# Patient Record
Sex: Male | Born: 1945 | Race: Black or African American | Hispanic: No | Smoking: Current some day smoker
Health system: Southern US, Community
[De-identification: ages and names within clinical notes are randomized; demographics above are authoritative.]

## PROBLEM LIST (undated history)

## (undated) DIAGNOSIS — H548 Legal blindness, as defined in USA: Secondary | ICD-10-CM

## (undated) DIAGNOSIS — H409 Unspecified glaucoma: Secondary | ICD-10-CM

## (undated) DIAGNOSIS — Z720 Tobacco use: Secondary | ICD-10-CM

## (undated) DIAGNOSIS — J9610 Chronic respiratory failure, unspecified whether with hypoxia or hypercapnia: Secondary | ICD-10-CM

## (undated) DIAGNOSIS — I10 Essential (primary) hypertension: Secondary | ICD-10-CM

## (undated) DIAGNOSIS — I4891 Unspecified atrial fibrillation: Secondary | ICD-10-CM

## (undated) DIAGNOSIS — E119 Type 2 diabetes mellitus without complications: Secondary | ICD-10-CM

## (undated) HISTORY — PX: ABDOMINAL SURGERY: SHX537

## (undated) HISTORY — PX: TRACHEOSTOMY: SUR1362

---

## 2003-12-26 ENCOUNTER — Other Ambulatory Visit: Payer: Self-pay

## 2004-01-04 ENCOUNTER — Other Ambulatory Visit: Payer: Self-pay

## 2009-01-30 ENCOUNTER — Emergency Department: Payer: Self-pay | Admitting: Emergency Medicine

## 2010-02-18 ENCOUNTER — Inpatient Hospital Stay: Payer: Self-pay | Admitting: Internal Medicine

## 2011-06-11 DIAGNOSIS — H409 Unspecified glaucoma: Secondary | ICD-10-CM | POA: Insufficient documentation

## 2012-11-20 ENCOUNTER — Emergency Department: Payer: Self-pay | Admitting: Emergency Medicine

## 2012-11-20 LAB — URINALYSIS, COMPLETE
Blood: NEGATIVE
Hyaline Cast: 25
Ph: 6 (ref 4.5–8.0)
Protein: NEGATIVE
RBC,UR: 1 /HPF (ref 0–5)
Squamous Epithelial: 2
WBC UR: 28 /HPF (ref 0–5)

## 2012-11-20 LAB — CBC WITH DIFFERENTIAL/PLATELET
Basophil #: 0 10*3/uL (ref 0.0–0.1)
Basophil %: 0.4 %
Lymphocyte #: 3.6 10*3/uL (ref 1.0–3.6)
Lymphocyte %: 53.3 %
MCH: 23 pg — ABNORMAL LOW (ref 26.0–34.0)
Monocyte #: 0.5 x10 3/mm (ref 0.2–1.0)
Neutrophil #: 2.6 10*3/uL (ref 1.4–6.5)
Neutrophil %: 38.9 %
Platelet: 441 10*3/uL — ABNORMAL HIGH (ref 150–440)
RBC: 3.75 10*6/uL — ABNORMAL LOW (ref 4.40–5.90)
RDW: 24.6 % — ABNORMAL HIGH (ref 11.5–14.5)

## 2012-11-20 LAB — DRUG SCREEN, URINE
Amphetamines, Ur Screen: NEGATIVE (ref ?–1000)
Benzodiazepine, Ur Scrn: NEGATIVE (ref ?–200)
Cannabinoid 50 Ng, Ur ~~LOC~~: NEGATIVE (ref ?–50)
Cocaine Metabolite,Ur ~~LOC~~: NEGATIVE (ref ?–300)
MDMA (Ecstasy)Ur Screen: NEGATIVE (ref ?–500)
Opiate, Ur Screen: NEGATIVE (ref ?–300)
Tricyclic, Ur Screen: NEGATIVE (ref ?–1000)

## 2012-11-20 LAB — COMPREHENSIVE METABOLIC PANEL
Alkaline Phosphatase: 102 U/L (ref 50–136)
BUN: 8 mg/dL (ref 7–18)
Calcium, Total: 8.6 mg/dL (ref 8.5–10.1)
Chloride: 97 mmol/L — ABNORMAL LOW (ref 98–107)
Creatinine: 0.69 mg/dL (ref 0.60–1.30)
EGFR (African American): >60
EGFR (Non-African Amer.): >60
Glucose: 73 mg/dL (ref 65–99)
Osmolality: 274 (ref 275–301)
SGOT(AST): 112 U/L — ABNORMAL HIGH (ref 15–37)
SGPT (ALT): 30 U/L (ref 12–78)
Total Protein: 8.2 g/dL (ref 6.4–8.2)

## 2012-11-20 LAB — ETHANOL: Ethanol: 269 mg/dL

## 2012-11-20 LAB — LIPASE, BLOOD: Lipase: 95 U/L (ref 73–393)

## 2012-11-20 LAB — AMMONIA: Ammonia, Plasma: 32 mcmol/L (ref 11–32)

## 2012-11-20 LAB — MAGNESIUM: Magnesium: 1.3 mg/dL — ABNORMAL LOW

## 2012-11-20 LAB — TROPONIN I: Troponin-I: <0.02 ng/mL

## 2013-01-21 ENCOUNTER — Inpatient Hospital Stay: Payer: Self-pay | Admitting: Internal Medicine

## 2013-01-21 LAB — DRUG SCREEN, URINE
Amphetamines, Ur Screen: NEGATIVE (ref ?–1000)
Barbiturates, Ur Screen: NEGATIVE (ref ?–200)
Methadone, Ur Screen: NEGATIVE (ref ?–300)
Opiate, Ur Screen: NEGATIVE (ref ?–300)
Phencyclidine (PCP) Ur S: NEGATIVE (ref ?–25)
Tricyclic, Ur Screen: NEGATIVE (ref ?–1000)

## 2013-01-21 LAB — CK TOTAL AND CKMB (NOT AT ARMC)
CK, Total: 47 U/L (ref 35–232)
CK-MB: <0.5 ng/mL — ABNORMAL LOW (ref 0.5–3.6)

## 2013-01-21 LAB — URINALYSIS, COMPLETE
Bilirubin,UR: NEGATIVE
Hyaline Cast: 48
Nitrite: NEGATIVE
Specific Gravity: 1.023 (ref 1.003–1.030)
WBC UR: 49 /HPF (ref 0–5)

## 2013-01-21 LAB — COMPREHENSIVE METABOLIC PANEL
Albumin: 3.2 g/dL — ABNORMAL LOW (ref 3.4–5.0)
Alkaline Phosphatase: 92 U/L (ref 50–136)
Bilirubin,Total: 0.6 mg/dL (ref 0.2–1.0)
Chloride: 95 mmol/L — ABNORMAL LOW (ref 98–107)
Co2: 32 mmol/L (ref 21–32)
Creatinine: 0.85 mg/dL (ref 0.60–1.30)
EGFR (African American): >60
EGFR (Non-African Amer.): >60
Osmolality: 269 (ref 275–301)
SGOT(AST): 48 U/L — ABNORMAL HIGH (ref 15–37)
Sodium: 134 mmol/L — ABNORMAL LOW (ref 136–145)
Total Protein: 8.6 g/dL — ABNORMAL HIGH (ref 6.4–8.2)

## 2013-01-21 LAB — IRON AND TIBC
Iron Saturation: 29 %
Iron: 74 ug/dL (ref 65–175)

## 2013-01-21 LAB — CBC
HGB: 8.9 g/dL — ABNORMAL LOW (ref 13.0–18.0)
MCH: 24.2 pg — ABNORMAL LOW (ref 26.0–34.0)
MCHC: 31.8 g/dL — ABNORMAL LOW (ref 32.0–36.0)
MCV: 76 fL — ABNORMAL LOW (ref 80–100)
RBC: 3.68 10*6/uL — ABNORMAL LOW (ref 4.40–5.90)
RDW: 22 % — ABNORMAL HIGH (ref 11.5–14.5)
WBC: 7.2 10*3/uL (ref 3.8–10.6)

## 2013-01-21 LAB — FERRITIN: Ferritin (ARMC): 429 ng/mL — ABNORMAL HIGH (ref 8–388)

## 2013-01-21 LAB — MAGNESIUM: Magnesium: 0.6 mg/dL — ABNORMAL LOW

## 2013-01-21 LAB — ETHANOL
Ethanol %: <0.003 % (ref 0.000–0.080)
Ethanol: <3 mg/dL

## 2013-01-21 LAB — TROPONIN I: Troponin-I: <0.02 ng/mL

## 2013-01-22 LAB — CBC WITH DIFFERENTIAL/PLATELET
Basophil #: 0 10*3/uL (ref 0.0–0.1)
Basophil %: 0.2 %
Eosinophil %: 0.6 %
HCT: 24.5 % — ABNORMAL LOW (ref 40.0–52.0)
Lymphocyte %: 38 %
MCH: 24.8 pg — ABNORMAL LOW (ref 26.0–34.0)
MCV: 76 fL — ABNORMAL LOW (ref 80–100)
Neutrophil #: 2.8 10*3/uL (ref 1.4–6.5)
RBC: 3.24 10*6/uL — ABNORMAL LOW (ref 4.40–5.90)

## 2013-01-22 LAB — BASIC METABOLIC PANEL
Anion Gap: 8 (ref 7–16)
BUN: 12 mg/dL (ref 7–18)
EGFR (African American): >60
Osmolality: 273 (ref 275–301)

## 2013-01-22 LAB — URINE CULTURE

## 2013-01-23 LAB — OCCULT BLOOD X 1 CARD TO LAB, STOOL: Occult Blood, Feces: NEGATIVE

## 2013-01-24 LAB — CBC WITH DIFFERENTIAL/PLATELET
Basophil #: 0 10*3/uL (ref 0.0–0.1)
Eosinophil %: 0.5 %
HCT: 23.7 % — ABNORMAL LOW (ref 40.0–52.0)
HGB: 7.7 g/dL — ABNORMAL LOW (ref 13.0–18.0)
Lymphocyte #: 2.1 10*3/uL (ref 1.0–3.6)
Lymphocyte %: 31.5 %
MCH: 24.8 pg — ABNORMAL LOW (ref 26.0–34.0)
MCV: 77 fL — ABNORMAL LOW (ref 80–100)
Monocyte %: 16.1 %
Neutrophil #: 3.4 10*3/uL (ref 1.4–6.5)
Neutrophil %: 51.5 %
RDW: 21.7 % — ABNORMAL HIGH (ref 11.5–14.5)
WBC: 6.6 10*3/uL (ref 3.8–10.6)

## 2013-01-24 LAB — BASIC METABOLIC PANEL
Anion Gap: 6 — ABNORMAL LOW (ref 7–16)
BUN: 5 mg/dL — ABNORMAL LOW (ref 7–18)
Calcium, Total: 8.9 mg/dL (ref 8.5–10.1)
Creatinine: 0.78 mg/dL (ref 0.60–1.30)
EGFR (African American): >60
Glucose: 116 mg/dL — ABNORMAL HIGH (ref 65–99)
Osmolality: 276 (ref 275–301)
Potassium: 5.1 mmol/L (ref 3.5–5.1)

## 2013-07-17 ENCOUNTER — Inpatient Hospital Stay: Payer: Self-pay | Admitting: Internal Medicine

## 2013-07-17 LAB — URINALYSIS, COMPLETE
Bacteria: NONE SEEN
Bilirubin,UR: NEGATIVE
Glucose,UR: 50 mg/dL (ref 0–75)
Leukocyte Esterase: NEGATIVE
Ph: 7 (ref 4.5–8.0)
Protein: 30
RBC,UR: 1 /HPF (ref 0–5)
Specific Gravity: 1.01 (ref 1.003–1.030)
Squamous Epithelial: NONE SEEN
WBC UR: 1 /HPF (ref 0–5)

## 2013-07-17 LAB — CBC WITH DIFFERENTIAL/PLATELET
Basophil #: 0.1 x10 3/mm 3
Basophil %: 0.6 %
Eosinophil #: 0.2 x10 3/mm 3
Eosinophil %: 1.9 %
HCT: 36.4 % — ABNORMAL LOW
HGB: 11.5 g/dL — ABNORMAL LOW
Lymphocyte %: 51.4 %
Lymphs Abs: 4.9 x10 3/mm 3 — ABNORMAL HIGH
MCH: 25.3 pg — ABNORMAL LOW
MCHC: 31.6 g/dL — ABNORMAL LOW
MCV: 80 fL
Monocyte #: 1 "x10 3/mm "
Monocyte %: 10.1 %
Neutrophil #: 3.4 x10 3/mm 3
Neutrophil %: 36 %
Platelet: 392 x10 3/mm 3
RBC: 4.54 x10 6/mm 3
RDW: 15.6 % — ABNORMAL HIGH
WBC: 9.5 x10 3/mm 3

## 2013-07-17 LAB — COMPREHENSIVE METABOLIC PANEL
BUN: 10 mg/dL (ref 7–18)
Calcium, Total: 8.6 mg/dL (ref 8.5–10.1)
Chloride: 107 mmol/L (ref 98–107)
EGFR (African American): >60
EGFR (Non-African Amer.): >60
Sodium: 141 mmol/L (ref 136–145)
Total Protein: 8 g/dL (ref 6.4–8.2)

## 2013-07-17 LAB — CK TOTAL AND CKMB (NOT AT ARMC): CK-MB: 0.9 ng/mL (ref 0.5–3.6)

## 2013-07-17 LAB — TROPONIN I: Troponin-I: <0.02 ng/mL

## 2013-07-18 LAB — BASIC METABOLIC PANEL
Anion Gap: 7 (ref 7–16)
BUN: 8 mg/dL (ref 7–18)
Chloride: 101 mmol/L (ref 98–107)
Co2: 30 mmol/L (ref 21–32)
Creatinine: 0.89 mg/dL (ref 0.60–1.30)
EGFR (African American): >60
EGFR (Non-African Amer.): >60
Osmolality: 274 (ref 275–301)
Potassium: 3.4 mmol/L — ABNORMAL LOW (ref 3.5–5.1)
Sodium: 138 mmol/L (ref 136–145)

## 2013-07-18 LAB — CBC WITH DIFFERENTIAL/PLATELET
Basophil %: 0.4 %
Eosinophil %: 1.4 %
HGB: 10.8 g/dL — ABNORMAL LOW (ref 13.0–18.0)
MCH: 25.4 pg — ABNORMAL LOW (ref 26.0–34.0)
MCV: 80 fL (ref 80–100)
Monocyte #: 1.1 x10 3/mm — ABNORMAL HIGH (ref 0.2–1.0)
Neutrophil #: 3.5 10*3/uL (ref 1.4–6.5)
Platelet: 299 10*3/uL (ref 150–440)
RBC: 4.26 10*6/uL — ABNORMAL LOW (ref 4.40–5.90)
WBC: 7 10*3/uL (ref 3.8–10.6)

## 2013-07-22 LAB — CULTURE, BLOOD (SINGLE)
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
## 2014-12-22 NOTE — Discharge Summary (Signed)
PATIENT NAME:  Dominic [name], Dominic [name] MR#:  [id number] DATE OF BIRTH:  [birth year]  DATE OF ADMISSION:  01/21/2013 DATE OF DISCHARGE:  01/24/2013  DISCHARGE DIAGNOSES: 1.  Weakness due to electrolyte imbalance. 2.  Urinary tract infection. 3.  Sleep apnea status post tracheostomy.  4.  Chronic anemia, may be malnutrition.  5.  Hypotension.  6.  Diabetes.   CONDITION ON DISCHARGE: Stable.   CODE STATUS ON DISCHARGE: FULL CODE.   MEDICATIONS ON DISCHARGE:  1.  Omeprazole 20 mg delayed-release capsule once a day. 2.  Oxycodone 5 mg oral tablet every 12 hours as needed for pain. 3.  Albuterol inhaler every 4 hours as needed for shortness of breath. 4.  Aspirin 81 mg daily  5.  Enalapril 10 mg once a day.  6.  Acetaminophen and oxycodone oral 4 times a day.  7.  Zocor 20 mg once a day.  8.  Magnesium oxide 400 mg tablet once a day.  9.  Levofloxacin 500 mg oral tablet once a day for 3 days.  10.  Ferrous sulfate 325 mg oral tablet 3 times a day.   DIET ON DISCHARGE: Low sodium, carbonate controlled ADA diet. Diet consistency: Regular.   ACTIVITY: As tolerated.   DISCHARGE INSTRUCTIONS:  Advised to follow up within 1 to 2 weeks with Dr. Marva PandaSkulskie and in 2 to 4 weeks with primary care physician, and to follow with Dr. Marva PandaSkulskie for arranging upper and lower GI scopy and with primary care physician in 1 month to have hemoglobin checked and potassium and magnesium level checked.  HISTORY OF PRESENT ILLNESS: The patient is a 69 year old African American male with past medical history significant for hypertension, diabetes, obstructive sleep apnea with chronic respiratory failure, has chronic tracheostomy more than 30 years, very poor historian and he had a caregiver who was currently not present. According to the patient, he was walking with walker in the past, but had bilateral [facility] extremity pain [facility]  Was on pain medication, Percocet, for long time. Over the past 3 months he had  weakness worsening and he was not able to walk properly.  He also complained of dark-colored stool and was found having guaiac-positive by Emergency Department, but his hemoglobin level was stable, so he was admitted for generalized weakness, extremely low sodium and potassium level.  HOSPITAL COURSE AND STAY:  1.  Generalized weakness due to electrolyte imbalance, hypokalemia and hypomagnesemia.  Replaced aggressively and level came to normal. The patient started feeling significantly better with electrolyte replacement. We had physical therapy evaluation done and they suggested home with home health discharge and we arranged for that.  2.  Bilateral lower extremity weakness, subacute in nature for almost 3 months. We wanted to do MRI of the brain to see for any intracranial process playing a role, but due to his cataract surgery and questionable lens in eye, we could not do the MRI in the hospital and the patient was feeling significantly better after electrolyte replacement so we let the patient go home without doing MRI.  3.  Anemia of chronic disease.  Most likely it was iron deficiency anemia. Stool guaiac, as per the ER physician, was positive, which was repeated again in the hospital stay and it was negative. family history of colon cancer, so gastroenterology consult with Dr. Marva PandaSkulskie was done. He suggested upper and lower GI scopy as inpatient, but the patient did not want to wait for 2 more days due to holiday and he  advised to follow in the clinic with Dr. Marva Panda.  4.  UTI.  The patient was found to a urinary tract infection on admission, treated with Levaquin and discharged with oral Levaquin for 3 more days.  5.  Hypertension.  Remained under control with enalapril. 6.  Diabetes. Maintained on sliding scale insulin coverage.  7.  Smoking and alcohol use.  Counseling was done for 3 to 4 minutes and advised the nicotine patch.  IMPORTANT LABORATORY AND DIAGNOSTIC RESULTS:  In the hospital  stay: CK level 47, CK-MB less than 0.5, troponin less than 0.02. Hemoglobin 8.9, mean corpuscular volume 76, platelet count 458. Creatinine 0.85, sodium 134, potassium 2.8.  Ethanol level less than 3. Hemoglobin A1c less than 3.5. Iron level in the serum 74, iron binding capacity 257,  and iron saturation 29%. Magnesium in the serum 0.6.   Chest x-ray:  No evidence of CHF, mild hyperinflation of lung which may be voluntary or component of underlying air trapping.   Urinalysis is positive with 49 WBCs and 1+ leukocyte esterase.   Urine culture: No growth. Urine for toxicology is negative.   Hemoglobin remained stable at 8. Potassium level came up to 3.1 and magnesium to 2.  Occult blood in the stool is negative, and hemoglobin came to 7.7 on the day of discharge, and on the day of discharge potassium was 5.1.   TOTAL TIME SPENT:  On this discharge, 45 minutes.  ____________________________ Dominic [name] Dominic Pigeon, MD vgv:sb D: 01/27/2013 22:54:27 ET T: 01/28/2013 07:40:51 ET JOB#: [id number]  cc: Dominic [name]. Dominic Pigeon, MD, <Dictator> Christena Deem, MD Ileene Hutchinson. Fransico Him, MD  Altamese Dilling MD ELECTRONICALLY SIGNED 01/31/2013 12:45

## 2014-12-22 NOTE — Consult Note (Signed)
Brief Consult Note: Diagnosis: anemia, heme positive stool.   Patient was seen by consultant.   Consult note dictated.   Recommend to proceed with surgery or procedure.   Recommend further assessment or treatment.   Comments: Patietn seen and examined, please see full GI consult [phone]#[id number].  Paitne admitted with weaknes, found with microcytic andemia nd heme positive stool.  Positive h/o n/v early satiety and epigstric bloating as well as fhx colon cancer in a secondary relative.  No personal h/o luminal evaluation.  Recommend egd and colonoscopy, will arrange for tuesday.  I have discussed the risks benefits and complicaitons of egd and colonoscopy to include not limited to bleeding infection perforation and sedation and he wishes to proceed.  Electronic Signatures: Barnetta ChapelSkulskie, Martin (MD)  (Signed 25-May-14 16:55)  Authored: Brief Consult Note   Last Updated: 25-May-14 16:55 by Barnetta ChapelSkulskie, Martin (MD)

## 2014-12-22 NOTE — Discharge Summary (Signed)
PATIENT NAME:  Dominic [name], Dominic [name] MR#:  [id number] DATE OF BIRTH:  [birth year]  DATE OF ADMISSION:  07/17/2013 DATE OF DISCHARGE:  07/19/2013  DISCHARGE DIAGNOSES: 1.  Acute respiratory failure likely due to pneumonia, now on room air. 2.  Multifocal pneumonia, improving on antibiotics. 3.  Malignant hypertension, improving.  4.  Chronic obstructive sleep apnea status post tracheostomy, at baseline. 5.  Hyperlipidemia, on Zocor. 6.  History of diabetes.   SECONDARY DIAGNOSES: 1.  Hypertension.  2.  Obstructive sleep apnea and chronic respiratory failure, on CPAP.  3.  Chronic tracheostomy for more than 30 years. 4.  Left eye legal blindness.  5.  Diabetes.  6.  Hypertension.  CONSULTATIONS: None.   PROCEDURES AND RADIOLOGY: Chest x-ray on 16th of November showed multifocal pneumonia. Mild cardiomegaly.   MAJOR LABORATORY PANEL: UA on admission was negative. Blood cultures x 2 were negative.   HISTORY AND SHORT HOSPITAL COURSE: The patient is a 69 year old male with above-mentioned medical problems who was admitted for acute respiratory failure thought to be secondary to multifocal pneumonia with hypoxia. Please see Dr. Rob HickmanGouru's dictated history and physical for further details. The patient was also found to have malignant hypertension which was improving with adjustment in the blood pressure medication. He had significant improvement. Now he is close to his baseline and his oxygen requirements are none and he is on room air. He is being discharged home in stable condition.  PERTINENT PHYSICAL EXAMINATION: VITAL SIGNS: On the date of discharge, temperature 98.3, heart rate 64 per minute, respirations 20 per minute, blood pressure 162/61 mmHg, and he is saturating 98% on room air. CARDIOVASCULAR: S1, S2 normal. No murmurs, rubs, or gallops.  LUNGS: Clear to auscultation bilaterally. No wheezing, rales, rhonchi, or crepitation. ABDOMEN: Soft, benign.  NEUROLOGIC: Nonfocal examination. All  other physical examination remained at baseline.   DISCHARGE MEDICATIONS: 1.  Omeprazole 20 mg p.o. daily.  2.  Oxycodone 5 mg p.o. b.i.d. as needed. 3.  Albuterol MDI inhaler every 4 hours as needed.  4.  Habitrol 14 mg patch as needed. 5.  Aspirin 81 p.o. daily.  6.  Enalapril 10 mg p.o. daily.  7.  Zocor 20 mg p.o. at bedtime. 8.  Magnesium oxide 400 mg p.o. daily. 9.  Iron sulfate 325 mg 3 times a day. 10.  Levofloxacin 750 mg p.o. for 5 days.  11.  HCTZ 25 mg p.o. daily.  DISCHARGE DIET: Low sodium.  DISCHARGE ACTIVITY: As tolerated.   DISCHARGE INSTRUCTIONS AND FOLLOWUP: The patient was instructed to follow up with his primary care physician at [facility] in 1 to 2 weeks.  TOTAL TIME DISCHARGING THIS PATIENT: 55 minutes. ____________________________ Ellamae SiaVipul S. Sherryll BurgerShah, MD vss:sb D: 07/19/2013 13:59:24 ET T: 07/19/2013 14:45:32 ET JOB#: [id number]  cc: Avaiah Stempel S. Sherryll BurgerShah, MD, <Dictator> [facility] R. Fransico HimBiola, MD Patricia PesaVIPUL S Katera Rybka MD ELECTRONICALLY SIGNED 07/21/2013 16:23
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
## 2014-12-22 NOTE — Consult Note (Signed)
Chief Complaint:  Subjective/Chief Complaint seen for anemia, heme poaitive stool. hemodynamically stable, no nv or abdominal pain.   VITAL SIGNS/ANCILLARY NOTES: **Vital Signs.:   26-May-14 03:50  Vital Signs Type Routine  Temperature Temperature (F) 98.2  Celsius 36.7  Temperature Source oral  Pulse Pulse 72  Respirations Respirations 18  Systolic BP Systolic BP 282  Diastolic BP (mmHg) Diastolic BP (mmHg) 75  Mean BP 104  Pulse Ox % Pulse Ox % 97  Pulse Ox Activity Level  At rest  Oxygen Delivery Trach Aerosol Mask   Brief Assessment:  Cardiac Regular   Respiratory clear BS   Gastrointestinal details normal Soft  Nontender  Nondistended  No masses palpable  Bowel sounds normal   Lab Results: Routine Chem:  23-May-14 12:32   Iron Binding Capacity (TIBC) 257  Unbound Iron Binding Capacity 183  Iron, Serum 74  Iron Saturation 29 (Result(s) reported on 21 Jan 2013 at 05:17PM.)  Ferritin [facility])  429 (Result(s) reported on 21 Jan 2013 at 05:31PM.)  26-May-14 04:36   BUN  5  Creatinine (comp) 0.78  Sodium, Serum 139  Potassium, Serum 5.1  Chloride, Serum 106  CO2, Serum 27  Calcium (Total), Serum 8.9  Anion Gap  6  Osmolality (calc) 276  eGFR (African American) >60  eGFR (Non-African American) >60 (eGFR values <27m/min/1.73 m2 may be an indication of chronic kidney disease (CKD). Calculated eGFR is useful in patients with stable renal function. The eGFR calculation will not be reliable in acutely ill patients when serum creatinine is changing rapidly. It is not useful in  patients on dialysis. The eGFR calculation may not be applicable to patients at the low and high extremes of body sizes, pregnant women, and vegetarians.)  Routine Hem:  23-May-14 12:32   Hemoglobin (CBC)  8.9  24-May-14 04:52   Hemoglobin (CBC)  8.0  26-May-14 04:36   WBC (CBC) 6.6  RBC (CBC)  3.08  Hemoglobin (CBC)  7.7  Hematocrit (CBC)  23.7  Platelet Count (CBC) 379  MCV  77  MCH   24.8  MCHC 32.4  RDW  21.7  Neutrophil % 51.5  Lymphocyte % 31.5  Monocyte % 16.1  Eosinophil % 0.5  Basophil % 0.4  Neutrophil # 3.4  Lymphocyte # 2.1  Monocyte #  1.1  Eosinophil # 0.0  Basophil # 0.0 (Result(s) reported on 24 Jan 2013 at 05:08AM.)   Assessment/Plan:  Assessment/Plan:  Assessment 1) anemia , heme positive stool in the setting of multiple systemic diseases, no previous luminal evaluation.   Plan 1) egd and colonoscopy.  I haved discussed  the risks benefits and complications of these to include not limited to bleeding infection perforation and sedation and he wishes to proceed.   Electronic Signatures: SLoistine Simas(MD)  (Signed 26-May-14 11:10)  Authored: Chief Complaint, VITAL SIGNS/ANCILLARY NOTES, Brief Assessment, Lab Results, Assessment/Plan   Last Updated: 26-May-14 11:10 by SLoistine Simas(MD)
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
## 2016-03-26 ENCOUNTER — Ambulatory Visit: Payer: Medicare Other | Attending: Nurse Practitioner | Admitting: Physical Therapy

## 2016-03-26 DIAGNOSIS — M6281 Muscle weakness (generalized): Secondary | ICD-10-CM | POA: Diagnosis present

## 2016-03-26 DIAGNOSIS — R262 Difficulty in walking, not elsewhere classified: Secondary | ICD-10-CM | POA: Diagnosis present

## 2016-03-26 NOTE — Therapy (Signed)
[place] MAIN [facility] SERVICES [address] [place], [place], [address] Phone: [phone]   Fax:  [phone]  Physical Therapy Evaluation  Patient Details  Name: Dominic [name] MRN: [id number] Date of Birth: Apr 18, 1946 No Data Recorded  Encounter Date: 03/26/2016      PT End of Session - 03/26/16 1634    Visit Number 1   Number of Visits 1   Date for PT Re-Evaluation 03/26/16   PT Start Time 1525   PT Stop Time 1625   PT Time Calculation (min) 60 min   Equipment Utilized During Treatment Gait belt   Activity Tolerance Patient tolerated treatment well;Patient limited by fatigue   Behavior During Therapy [facility] for tasks assessed/performed      No past medical history on file.  No past surgical history on file.  There were no vitals filed for this visit.    PATIENT INFORMATION: Name: Dominic [name] DOB:     [birth year]                 Sex: M Date seen:  03/26/16  Time: 3:25  Address: [place], [place], [place] This evaluation/justification form will serve as the LMN for the following suppliers: __________________________ Supplier: [employer] Contact Person: Almeta Monas, [place]ess Botts Phone: [phone]   Seating Therapist: Tilman Neat, SPT Blanche East, PT, DPT Phone:   [phone]   Phone: [phone]    Spouse/Parent/Caregiver name:   Phone number:  Insurance/Payer:      Reason for Referral: Power wheelchair   Patient/Caregiver Goals: Use to move in home  Patient was seen for face-to-face evaluation for new power wheelchair.  Also present was    Almeta Monas, ATP               to discuss recommendations and wheelchair options.  Further paperwork was completed and sent to vendor.  Patient appears to qualify for power mobility device at this time per objective findings.   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 Onset:  Diagnosis: Peripheral Neuropathy, Diabetes, Arthritis, Asthma, gout, chronic pain, blindness, glaucoma, hypertension   _0 Progressive Disease Relevant past and future surgeries: Tracheostomy 1983   Height: 5'11 1/2" Weight: 218 Explain recent changes or trends in weight: None   History including Falls: Fell 3 months ago with w/c, not being locked    HOME ENVIRONMENT: _1 House  _2 Condo/town home  _3 Apartment  _4 Assisted Living    _5 Lives Alone _6  Lives with Others                                                    Hours with caregiver:   _7 Home is accessible to patient           Stairs      _8 Yes _9  No     Ramp _10 Yes _11 No Comments:  4-5 steps to get in and out, Railings both sides, reach 1 railing, about to get ramp    COMMUNITY ADL: TRANSPORTATION: _12 Car    _13 Van    <WPYKDXIPJASNKNLZ>_7<\/QBHALPFXTKWIOXBD>_53 Public Transportation    _15 Adapted w/c  Lift    _0 Ambulance    _1 Other:       _2 Sits in wheelchair during transport  Employment/School:    Needs to get out at times  Specific requirements pertaining to mobility                                                     Other:                                     FUNCTIONAL/SENSORY PROCESSING SKILLS:  Handedness:   _3 Right     _4 Left    _5 NA  Comments:                                 Functional Processing Skills for Wheeled Mobility _6 Processing Skills are adequate for safe wheelchair operation (difficulty seeing out of L eye)  Areas of concern than may interfere with safe operation of wheelchair Description of problem   _7  Attention to environment      _8 Judgment      _9  Hearing  _10  Vision or visual processing      _11 Motor Planning  _12  Fluctuations in Behavior                                                Blindness/glaucoma in L eye, R WFL Grossly able to see to direct w/c    VERBAL COMMUNICATION: _13 WFL receptive _14  WFL expressive _15 Understandable  _16 Difficult to understand (in loud environments)  _17 non-communicative _18  Uses an augmented  communication device  CURRENT SEATING / MOBILITY: Current Mobility Base:  _19 None _20 Dependent _21 Manual _22 Scooter _23 Power  Type of Control:                       Manufacturer:        [employer]                 Size:                         Age:     ~5 years                      Current Condition of Mobility Base:         Not working, tires keep blowing out, only maintains a week or 2. Battery runs out quickly.                                                                                                             Current Wheelchair components:  Describe posture in present seating system:  Able to sit comfortably                                                                           SENSATION and SKIN ISSUES: Sensation _0 Intact  _1 Impaired _2 Absent  Level of sensation: Peripheral Neuropathy, Intact LT sensation throughout LE grossly with impaired sensation in L anterior ankle and dorsum of foot                      Pressure Relief: Able to perform effective pressure relief :    _3 Yes  _4  No Method:   Shifts weight from side to side                                                                       If not, Why?:                                                                          Skin Issues/Skin Integrity Current Skin Issues  _5 Yes _6 No _7 Intact _8  Red area_9  Open Area  _10 Scar Tissue _11 At risk from prolonged sitting Where                              History of Skin Issues  _12 Yes _13 No Where                                         When                                               Hx of skin flap surgeries  _14 Yes _15 No Where                                              When                                                  Limited sitting tolerance _16 Yes _17 No Hours spent sitting in wheelchair daily:  Able to sit in chair 1-2 hours  Complaint of Pain: B feet Please describe:   Throbbing pain, props feet often, moving helps. VAS: 6-7/10                                                                                                          Swelling/Edema:    None present, reports swelling every so often with increased movement                                                                                                                                          ADL STATUS (in reference to wheelchair use):  Indep Assist Unable Indep with Equip Not assessed Comments  Dressing       x                                             Occasionally needs assistance depending on the day                     Eating       x                                                                                                                       Toileting                 x  Bathing                x                                                  Requires assistance occasionally. Uses shower chair                                                                    Grooming/ Hygiene       x                                                                                                                       Meal Prep                             x                                                                                             IADLS                  x                                               With moving outside of the house                                                 Bowel Management: _0 Continent  _1 Incontinent  _2 Accidents Comments:                                                  Bladder Management: _3 Continent  _4 Incontinent  _5 Accidents Comments:  WHEELCHAIR SKILLS: Manual w/c Propulsion: _0 UE or LE strength and endurance sufficient to  participate in ADLs using manual wheelchair Arm : _1 left _2 right   _3 Both      Distance:                                      Foot:  _4 left _5 right   _6 Both  Operate Scooter: _7  Strength, hand grip, balance and transfer appropriate for use _8 Living environment is accessible for use of scooter  Operate Power w/c:  _9  Std. Joystick   _10  Alternative Controls Indep _11  Assist _12  Dependent/ unable _13  N/A _14   _15 Safe          _16  Functional      Distance:               Bed confined without wheelchair _17  Yes _18  No   STRENGTH/RANGE OF MOTION:  Range of Motion Strength  Shoulder                         WFL                                                       4/5                             Elbow                                   WFL                                                 3+/5                            Wrist/Hand                                   WFL                                                             Impaired L grip, R grip WFL                                         Hip                                     [facility]  4-/5                     Knee                                     WFL                                                                   3+/5                      Ankle WFL                                             4/5                        MOBILITY/BALANCE:  _0  Patient is totally dependent for mobility                                                                                               Balance Transfers Ambulation  Sitting Balance: Standing Balance: _1  Independent _2  Independent/Modified Independent  _3  WFL     _4  WFL _5  Supervision _6  Supervision  _7  Uses UE for balance  _8  Supervision _9  Min Assist _10  Ambulates with Assist                           _11  Min Assist _12  Min assist _13  Mod Assist _14  Ambulates with Device:      _15  RW  _16  StW  _17  Cane  _18                 _19  Mod Assist _20  Mod  assist _21  Max assist   _22  Max Assist _23  Max assist _24  Dependent _25  Indep. Short Distance Only  _26  Unable _27  Unable _28  Lift / Sling Required Distance (in feet)      0.31 m/s, limited home ambulator: able to complete 205' with 6MWT, required 2 min. rest break after each 100' with shortness of breath but maintained oxygen status.  Previous to 6MWT BP: 147/69, HR: 75, O2: 98% Post 6MWT BP: 147/70, HR: 70, O2: 100%                    _29  Sliding board _30  Unable to Ambulate: (Explain:  Cardio Status:  _31 Intact  _32  Impaired   _33  NA                              Respiratory Status:  _34 Intact   _35 Impaired (sleep apnea, has trach, but SpO2  remains good with ambulation)  _0 NA                                     Orthotics/Prosthetics:                                                                         Comments (Address manual vs power w/c vs scooter):   Patient previously used power scooter. He does have poor grip strength on LUE, poor LLE muscle strength, and quick fatigue which may make using a manual wheelchair difficult.  Patient would have difficulty maneuvering a scooter due to his vision difficulties in L eye and mild weakness in L UE. Patient could benefit from power w/c due to being able to operate with R UE only and increase his safety and independence with home mobility during transfers to/from bathroom/kitchen.                                          Anterior / Posterior Obliquity Rotation-Pelvis                               PELVIS    _1  _2  _3   Neutral Posterior Anterior  _4  _5  _6   WFL Rt elev Lt elev  _7  _8  _9   WFL Right Left             Anterior Anterior     _10  Fixed _11  Other _12  Partly Flexible _13  Flexible   _14  Fixed _15  Other _16  Partly Flexible  _17  Flexible  _18  Fixed _19  Other _20  Partly Flexible  _21  Flexible   TRUNK  _22  _23  _24   [facility]  Thoracic  Lumbar  Kyphosis Lordosis  _25  _26  _27   WFL Convex Convex  Right Left _28 c-curve _29 s-curve                _30 multiple  _31   Neutral _32  Left-anterior _33  Right-anterior     _34  Fixed _35  Flexible _36  Partly Flexible  Other  _37  Fixed _38  Flexible _39  Partly Flexible_40  Other  _41  Fixed            _42  Flexible _43  Partly Flexible _44  Other    Position Windswept                   HIPS          _45            _46               _47  Neutral       Abduct        ADduct         _48           _49            _50   Neutral Right           Left      _51  Fixed _52  Subluxed _53  Partly Flexible                _54   Dislocated _0  Flexible  _1  Fixed _2  Other _3  Partly Flexible  _4  Flexible                 Foot Positioning Knee Positioning                              _5  WFL  _6 Lt _7 Rt _8  WFL  _9 Lt _10 Rt    KNEES ROM concerns: ROM WFL ROM concerns: ROM WFL    & Dorsi-Flexed _11 Lt _12 Rt                                   FEET Plantar Flexed _13 Lt _14 Rt      Inversion                 _15 Lt _16 Rt      Eversion                 _17 Lt _18 Rt     HEAD _19  Functional (limited in full cervical ROM especially flexion due to tracheostomy) _20  Good Head Control                     & _21  Flexed         _22  Extended _23  Adequate Head Control    NECK _24  Rotated  Lt  _25  Lat Flexed Lt _26  Rotated  Rt _27  Lat Flexed Rt _28  Limited Head Control     _29  Cervical Hyperextension _30  Absent  Head Control     SHOULDERS ELBOWS WRIST& HAND                                Left     Right    Left     Right    Left     Right   U/E _31 Functional           _32 Functional                                 _33 Fisting             _34 Fisting      _35 elev   _36 dep      _37 elev   _38 dep       _39 pro -_40 retract     _41 pro  _42 retract _43 subluxed             _44 subluxed          Goals for Wheelchair Mobility  _45  Independence with mobility in the home with motor related ADLs (MRADLs)  _46  Independence with MRADLs in the community _47  Provide dependent mobility  _48  Provide recline     _49 Provide tilt   Goals for Seating system _50  Optimize pressure distribution _51  Provide support needed to  facilitate function or safety _52  Provide corrective forces to assist with maintaining or improving posture _53  Accommodate client's posture:   current seated postures and positions are not flexible or will not tolerate corrective forces _54  Client to be independent with relieving pressure in the wheelchair _55 Enhance physiological function such as breathing, swallowing, digestion  Simulation ideas/Equipment trials:  State why other equipment was unsuccessful:                                                                         MOBILITY BASE RECOMMENDATIONS and JUSTIFICATION: MOBILITY COMPONENT JUSTIFICATION  Manufacturer:           Model:              Size: Width           Seat Depth             _0 provide transport from point A to B _1 promote Indep mobility  _2 is not a safe, functional ambulator _3 walker or cane inadequate _4 non-standard width/depth necessary to accommodate anatomical measurement _5                             _6 Manual Mobility Base _7 non-functional ambulator    _8 Scooter/POV  _9 can safely operate  _10 can safely transfer   _11 has adequate trunk stability  _12 cannot functionally propel manual w/c  _13 Power Mobility Base, Group 2  _14 non-ambulatory  _15 cannot functionally propel manual wheelchair  _16  cannot functionally and safely operate scooter/POV _17 can safely operate and willing to  _18 Stroller Base _19 infant/child  _20 unable to propel manual wheelchair _21 allows for growth _22 non-functional ambulator _23 non-functional UE _24 Indep mobility is not a goal at this time  _25 Tilt  _26 Forward _27 Backward _28 Powered tilt  _29 Manual tilt  _30 change position against gravitational force on head and shoulders  _31 change position for pressure relief/cannot weight shift _32 transfers  _33 management of tone _34 rest periods _35 control edema _36 facilitate postural control  _37                                        _38 Recline  _39 Power recline on power base _40 Manual recline on manual base  _41 accommodate femur to back angle  _42 bring to full recline for ADL care  _43 change position for pressure relief/cannot weight shift _44 rest periods _45 repositioning for transfers or clothing/diaper /catheter changes _46 head positioning  _47 Lighter weight required _48 self- propulsion  _49 lifting _50                                                 _51 Heavy Duty required _52 user weight greater than 250# _53 extreme tone/ over active movement _54 broken frame on previous chair _55                                     _56  Back  _57  Angle Adjustable _58  Custom molded                           _59 postural control _60 control of tone/spasticity _61 accommodation of range of motion _62 UE functional control _63 accommodation for seating system _64                                          _65   provide lateral trunk support _0 accommodate deformity _1 provide posterior trunk support _2 provide lumbar/sacral support _3 support trunk in midline _4 Pressure relief over spinal processes  _5  Seat Cushion                       _6 impaired sensation  _7 decubitus ulcers present _8 history of pressure ulceration _9 prevent pelvic extension _10 low maintenance  _11 stabilize pelvis  _12 accommodate obliquity _13 accommodate multiple deformity _14 neutralize lower extremity position _15 increase pressure distribution _16                                           _17  Pelvic/thigh support  _18  Lateral thigh guide _19  Distal medial pad  _20  Distal lateral pad _21  pelvis in neutral _22 accommodate pelvis _23  position upper legs _24  alignment _25  accommodate ROM _26  decrease adduction _27 accommodate tone _28 removable for transfers _29 decrease abduction  _30  Lateral trunk Supports _31  Lt     _32  Rt _33 decrease lateral trunk leaning _34 control tone _35 contour for increased contact _36 safety  _37 accommodate asymmetry _38                                                 _39  Mounting hardware   _40 lateral trunk supports  _41 back   _42 seat _43 headrest      _44  thigh support _45 fixed   _46 swing away _47 attach seat platform/cushion to w/c frame _48 attach back cushion to w/c frame _49 mount postural supports _50 mount headrest  _51 swing medial thigh support away _52 swing lateral supports away for transfers  _53                                                     Armrests  _54 fixed _55 adjustable height _56 removable   _57 swing away  _58 flip back   _59 reclining _60 full length pads _61 desk    _62 pads tubular  _63 provide support with elbow at 90   _64 provide support for w/c tray _65 change of height/angles for variable activities _66 remove for transfers _67 allow to come closer to table top _68 remove for access to tables _69                                               Hangers/ Leg rests  _70 60 _71 70 _72 90 _73 elevating _74 heavy duty  _75 articulating _76 fixed _77 lift off _78 swing away     _79 power _80 provide LE support  _81 accommodate to hamstring tightness _82 elevate legs during recline   _83 provide change in position for Legs _84 Maintain placement of feet on footplate _85 durability _86 enable transfers _87 decrease edema _88 Accommodate lower leg length _89                                         Foot support Footplate    <YWVPXTGGYIRSWNIO>_2<\/VOJJKKXFGHWEXHBZ>_16 Lt  _91  Rt  _92  Center mount _93 flip up     _94 depth/angle adjustable _95 Amputee adapter    _96  Lt     _97  Rt  Footboard _98 provide foot support _99 accommodate to ankle ROM _100 transfers _101 Provide support for residual extremity _102   allow foot to go under wheelchair base _0  decrease tone  _1                                                 _2  Ankle strap/heel loops _3 support foot on foot support _4 decrease extraneous movement _5 provide input to heel  _6 protect foot  Tires: _7 pneumatic  _8 flat free inserts  _9 solid  _10 decrease maintenance  _11 prevent frequent flats _12 increase shock absorbency _13 decrease pain from road shock _14 decrease spasms from road shock _15                                               _16  Headrest  _17 provide posterior head support _18 provide posterior neck support _19 provide lateral head support _20 provide anterior head support _21 support during tilt and recline _22 improve feeding   _23 improve respiration _24 placement of switches _25 safety  _26 accommodate ROM  _27 accommodate tone _28 improve visual orientation  _29  Anterior chest strap _30  Vest _31  Shoulder retractors  _32 decrease forward movement of shoulder _33 accommodation of TLSO _34 decrease forward movement of trunk _35 decrease shoulder elevation _36 added abdominal support _37 alignment _38 assistance with shoulder control  _39                                               Pelvic Positioner _40 Belt _41 SubASIS bar _42 Dual Pull _43 stabilize tone _44 decrease falling out of chair/ **will not Decrease potential for sliding due to pelvic tilting _45 prevent excessive rotation _46 pad for protection over boney prominence _47 prominence comfort _48 special pull angle to control rotation _49                                                  Upper ExtremitySupport _50 L   _51  R _52 Arm trough    _53 hand support _54  tray       _55 full tray _56 swivel mount _57 decrease edema      _58 decrease subluxation   _59 control tone   _60 placement for AAC/Computer/EADL _61 decrease gravitational pull on shoulders _62 provide midline positioning _63 provide support to increase UE function _64 provide hand support in natural position _65 provide work surface   POWER WHEELCHAIR CONTROLS  _66 Proportional  _67 Non-Proportional Type                                      _68 Left  _69 Right _70 provides access for controlling wheelchair   _71 lacks motor control to operate proportional drive control  <PFXTKWIOXBDZHGDJ>_2<\/EQASTMHDQQIWLNLG>_92 unable to understand proportional controls  Actuator Control Module  _73 Single  _74 Multiple   _75 Allow the client to operate the power seat function(s) through the joystick control   _76 Safety Reset Switches _77 Used to change modes and stop the wheelchair when driving in latch mode    _78 [employer]   _79 programming for accurate control _80 progressive Disease/changing condition _81 non-proportional drive control needed _82 Needed in order to operate power seat functions through joystick control   _83 Display box _84 Allows user to see in which mode and drive the wheelchair is set  _85 necessary for alternate controls    _86   Digital interface electronics _0 Allows w/c to operate when using alternative drive controls  <OITGPQDIYMEBRAXE>_9<\/MMHWKGSUPJSRPRXY>_5 ASL Head Array _2 Allows client to operate wheelchair  through switches placed in tri-panel headrest  _3 Sip and puff with tubing kit _4 needed to operate sip and puff drive controls  <OPFYTWKMQKMMNOTR>_7<\/NHAFBXUXYBFXOVAN>_1 Upgraded tracking electronics _6 increase safety when driving <BTYOMAYOKHTXHFSF>_4<\/ELTRVUYEBXIDHWYS>_1 correct tracking when on uneven surfaces  _8 [facility] for switches or joystick _9 Attaches switches to w/c  _10 Swing away for access or transfers _11 midline for optimal placement _12 provides for consistent access  _13 Attendant controlled joystick plus mount _14 safety _15 long distance driving <UOHFGBMSXJDBZMCE>_0<\/EMVVKPQAESLPNPYY>_51 operation of seat functions _17 compliance with transportation regulations _18                                             Rear wheel placement/Axle adjustability _19 None _20 semi adjustable _21 fully adjustable  _22 improved UE access to wheels _23 improved stability _24 changing angle in space for improvement of postural stability _25 1-arm drive access <TMYTRZNBVAPOLIDC>_3<\/UDTHYHOOILNZVJKQ>_20 amputee pad placement _27                                Wheel rims/ hand rims  _28 metal  _29 plastic coated _30 oblique projections _31 vertical projections _32 Provide ability to propel manual wheelchair  _33  Increase self-propulsion with hand weakness/decreased grasp  Push handles _34 extended  _35 angle adjustable  _36 standard _37 caregiver access _38 caregiver assist _39 allows "hooking" to enable increased ability to perform ADLs or maintain balance  One armed device  _40 Lt   _41 Rt _42 enable propulsion of manual wheelchair with one arm   _43                                            Brake/wheel lock extension _44  Lt   _45  Rt  _46 increase indep in applying wheel locks   _47 Side guards _48 prevent clothing getting caught in wheel or becoming soiled _49  prevent skin tears/abrasions  Battery: 2, Group 22                                           _50 to power wheelchair                                                         Other:                                                                                                                        The above equipment has a life- long use expectancy. Growth and changes in medical and/or functional conditions would be the  exceptions. This is to certify that the therapist has no financial relationship with durable medical provider or manufacturer. The therapist will not receive remuneration of any kind for the equipment recommended in this evaluation.   Patient has mobility limitation that significantly impairs safe, timely participation in one or more mobility related ADL's.  (bathing, toileting, feeding, dressing, grooming, moving from room to room)                                                             _0  Yes _1  No Will mobility device sufficiently improve ability to participate and/or be aided in participation of MRADL's?      _2  Yes _3  No Can limitation be compensated for with use of a cane or walker?                                                                                _4  Yes _5  No Does patient or caregiver demonstrate ability/potential ability & willingness to safely use the mobility device?    _6  Yes _7  No Does patient's home environment support use of recommended mobility device?                                                     _8  Yes _9  No Does patient have sufficient upper extremity function necessary to functionally propel a manual wheelchair?     _10  Yes _11  No Does patient have sufficient strength and trunk stability to safely operate a POV (scooter)?                                   _12  Yes _13  No Does patient need additional features/benefits  provided by a power wheelchair for MRADL's in the home?        _14  Yes _15  No Does the patient demonstrate the ability to safely use a power wheelchair?                                                               _16  Yes _17  No    Physician's Name Printed:                                                         Physician's Signature:  Date:     This is to certify that I, the above signed therapist have the following affiliations: _18  This  DME provider _0  Manufacturer of recommended equipment _1  Patient's long term care facility _2  None of the above  Therapist Name/Signature:   Tilman Neat, SPT;     Norwood Levo. Barnet Glasgow PT, DPT                                         Date: 03/27/16                                  PT Long Term Goals - 03/26/16 1639      PT LONG TERM GOAL #1   Title Patient will be independent in understanding recommendation of wheelchair type by the end of session to ensure safe mobility at home.   Time 1   Period Days   Status Achieved               Plan - 03/26/16 1635    Clinical Impression Statement PT assessed patient for power wheelchair. Patient is able to walk 205' with supervision with good oxygen levels. However, patient has weakness in L UE/LE. Although patient is able to walk 205' patient requies 1 rest break and becomes short of breath. Patient has mild balance impairment with increased sway during eyes closed on firm surface. Patient demonstrates min VCs and min assist during stand pivot transfer due to lack of safety. After verbal cues, patient is able to demonstrate safe transfers. Please see attached wheelchair evaluation for recommendations.   Rehab Potential Fair   Clinical Impairments Affecting Rehab Potential Negative Factors: blindness L eye, L UE/LE weakness, tracheostomy, PMH of hypertension, gout Positive Factors: good SpO2, ability to use power w/c previously   PT Frequency One time visit   PT  Treatment/Interventions Patient/family education;Wheelchair mobility training   Consulted and Agree with Plan of Care Patient      Patient will benefit from skilled therapeutic intervention in order to improve the following deficits and impairments:  Abnormal gait, Decreased activity tolerance, Decreased balance, Decreased coordination, Decreased endurance, Decreased mobility, Decreased safety awareness, Decreased strength, Difficulty walking, Impaired flexibility, Postural dysfunction, Improper body mechanics, Impaired UE functional use, Impaired sensation, Obesity, Pain  Visit Diagnosis: Difficulty in walking, not elsewhere classified - Plan: PT plan of care cert/re-cert  Muscle weakness (generalized) - Plan: PT plan of care cert/re-cert      G-Codes - 50/56/97 1108    Functional Assessment Tool Used gait ability, clinical judgement   Functional Limitation Mobility: Walking and moving around   Mobility: Walking and Moving Around Current Status [phone]) At least 40 percent but less than 60 percent impaired, limited or restricted   Mobility: Walking and Moving Around Goal Status [phone]) At least 40 percent but less than 60 percent impaired, limited or restricted   Mobility: Walking and Moving Around Discharge Status [phone]) At least 40 percent but less than 60 percent impaired, limited or restricted       Problem List There are no active problems to display for this patient.  Tilman Neat, SPT This entire session was performed under direct supervision and direction of a licensed therapist/therapist assistant . I have personally read, edited and approve of the note as written.  Trotter,Margaret PT, DPT 03/27/2016, 11:11 AM  [place] [facility] SERVICES [address] [place], [place], [address] Phone: [phone]   Fax:  [phone]  Name: TYSON PARKISON MRN: [id number] Date of Birth: Jan 20, 1946

## 2016-05-01 ENCOUNTER — Emergency Department: Payer: Medicare Other

## 2016-05-01 ENCOUNTER — Encounter: Payer: Self-pay | Admitting: *Deleted

## 2016-05-01 ENCOUNTER — Inpatient Hospital Stay
Admission: EM | Admit: 2016-05-01 | Discharge: 2016-05-12 | DRG: 871 | Disposition: A | Discharge status: Skilled Nursing Facility | Payer: Medicare Other | Attending: Internal Medicine | Admitting: Internal Medicine

## 2016-05-01 DIAGNOSIS — R6521 Severe sepsis with septic shock: Secondary | ICD-10-CM | POA: Diagnosis present

## 2016-05-01 DIAGNOSIS — E871 Hypo-osmolality and hyponatremia: Secondary | ICD-10-CM | POA: Diagnosis present

## 2016-05-01 DIAGNOSIS — R05 Cough: Secondary | ICD-10-CM

## 2016-05-01 DIAGNOSIS — Z79899 Other long term (current) drug therapy: Secondary | ICD-10-CM

## 2016-05-01 DIAGNOSIS — F1010 Alcohol abuse, uncomplicated: Secondary | ICD-10-CM | POA: Diagnosis present

## 2016-05-01 DIAGNOSIS — I10 Essential (primary) hypertension: Secondary | ICD-10-CM | POA: Diagnosis present

## 2016-05-01 DIAGNOSIS — D509 Iron deficiency anemia, unspecified: Secondary | ICD-10-CM | POA: Diagnosis present

## 2016-05-01 DIAGNOSIS — E1165 Type 2 diabetes mellitus with hyperglycemia: Secondary | ICD-10-CM | POA: Diagnosis present

## 2016-05-01 DIAGNOSIS — J40 Bronchitis, not specified as acute or chronic: Secondary | ICD-10-CM | POA: Diagnosis present

## 2016-05-01 DIAGNOSIS — D638 Anemia in other chronic diseases classified elsewhere: Secondary | ICD-10-CM | POA: Diagnosis present

## 2016-05-01 DIAGNOSIS — A4159 Other Gram-negative sepsis: Secondary | ICD-10-CM | POA: Diagnosis present

## 2016-05-01 DIAGNOSIS — N170 Acute kidney failure with tubular necrosis: Secondary | ICD-10-CM | POA: Diagnosis present

## 2016-05-01 DIAGNOSIS — E860 Dehydration: Secondary | ICD-10-CM | POA: Diagnosis present

## 2016-05-01 DIAGNOSIS — M6282 Rhabdomyolysis: Secondary | ICD-10-CM | POA: Diagnosis present

## 2016-05-01 DIAGNOSIS — H409 Unspecified glaucoma: Secondary | ICD-10-CM | POA: Diagnosis present

## 2016-05-01 DIAGNOSIS — Z87891 Personal history of nicotine dependence: Secondary | ICD-10-CM | POA: Diagnosis not present

## 2016-05-01 DIAGNOSIS — G4733 Obstructive sleep apnea (adult) (pediatric): Secondary | ICD-10-CM | POA: Diagnosis present

## 2016-05-01 DIAGNOSIS — D6959 Other secondary thrombocytopenia: Secondary | ICD-10-CM | POA: Diagnosis present

## 2016-05-01 DIAGNOSIS — N179 Acute kidney failure, unspecified: Secondary | ICD-10-CM

## 2016-05-01 DIAGNOSIS — H5442 Blindness, left eye, normal vision right eye: Secondary | ICD-10-CM | POA: Diagnosis present

## 2016-05-01 DIAGNOSIS — E785 Hyperlipidemia, unspecified: Secondary | ICD-10-CM | POA: Diagnosis present

## 2016-05-01 DIAGNOSIS — Z7982 Long term (current) use of aspirin: Secondary | ICD-10-CM | POA: Diagnosis not present

## 2016-05-01 DIAGNOSIS — K5900 Constipation, unspecified: Secondary | ICD-10-CM | POA: Diagnosis present

## 2016-05-01 DIAGNOSIS — I4891 Unspecified atrial fibrillation: Secondary | ICD-10-CM | POA: Diagnosis present

## 2016-05-01 DIAGNOSIS — R0902 Hypoxemia: Secondary | ICD-10-CM

## 2016-05-01 DIAGNOSIS — N390 Urinary tract infection, site not specified: Secondary | ICD-10-CM | POA: Diagnosis present

## 2016-05-01 DIAGNOSIS — K219 Gastro-esophageal reflux disease without esophagitis: Secondary | ICD-10-CM | POA: Diagnosis present

## 2016-05-01 DIAGNOSIS — A419 Sepsis, unspecified organism: Secondary | ICD-10-CM | POA: Diagnosis present

## 2016-05-01 DIAGNOSIS — R739 Hyperglycemia, unspecified: Secondary | ICD-10-CM

## 2016-05-01 DIAGNOSIS — Z930 Tracheostomy status: Secondary | ICD-10-CM | POA: Diagnosis not present

## 2016-05-01 DIAGNOSIS — Z794 Long term (current) use of insulin: Secondary | ICD-10-CM

## 2016-05-01 DIAGNOSIS — R7881 Bacteremia: Secondary | ICD-10-CM | POA: Diagnosis not present

## 2016-05-01 DIAGNOSIS — R059 Cough, unspecified: Secondary | ICD-10-CM

## 2016-05-01 DIAGNOSIS — J9611 Chronic respiratory failure with hypoxia: Secondary | ICD-10-CM | POA: Diagnosis present

## 2016-05-01 DIAGNOSIS — Z992 Dependence on renal dialysis: Secondary | ICD-10-CM

## 2016-05-01 DIAGNOSIS — J9600 Acute respiratory failure, unspecified whether with hypoxia or hypercapnia: Secondary | ICD-10-CM

## 2016-05-01 HISTORY — DX: Legal blindness, as defined in USA: H54.8

## 2016-05-01 HISTORY — DX: Type 2 diabetes mellitus without complications: E11.9

## 2016-05-01 HISTORY — DX: Tobacco use: Z72.0

## 2016-05-01 HISTORY — DX: Chronic respiratory failure, unspecified whether with hypoxia or hypercapnia: J96.10

## 2016-05-01 HISTORY — DX: Unspecified atrial fibrillation: I48.91

## 2016-05-01 HISTORY — DX: Essential (primary) hypertension: I10

## 2016-05-01 HISTORY — DX: Unspecified glaucoma: H40.9

## 2016-05-01 LAB — CBC
HCT: 34.1 % — ABNORMAL LOW (ref 40.0–52.0)
Hemoglobin: 11.1 g/dL — ABNORMAL LOW (ref 13.0–18.0)
MCH: 23.7 pg — ABNORMAL LOW (ref 26.0–34.0)
MCHC: 32.5 g/dL (ref 32.0–36.0)
MCV: 73 fL — ABNORMAL LOW (ref 80.0–100.0)
PLATELETS: 82 10*3/uL — AB (ref 150–440)
RBC: 4.67 MIL/uL (ref 4.40–5.90)
RDW: 14.3 % (ref 11.5–14.5)
WBC: 23.7 10*3/uL — AB (ref 3.8–10.6)

## 2016-05-01 LAB — GLUCOSE, CAPILLARY
GLUCOSE-CAPILLARY: 499 mg/dL — AB (ref 65–99)
GLUCOSE-CAPILLARY: 414 mg/dL — AB (ref 65–99)
Glucose-Capillary: 454 mg/dL — ABNORMAL HIGH (ref 65–99)
Glucose-Capillary: 331 mg/dL — ABNORMAL HIGH (ref 65–99)

## 2016-05-01 LAB — BASIC METABOLIC PANEL
ANION GAP: 14 (ref 5–15)
BUN: 44 mg/dL — ABNORMAL HIGH (ref 6–20)
CALCIUM: 8.9 mg/dL (ref 8.9–10.3)
CO2: 20 mmol/L — ABNORMAL LOW (ref 22–32)
CREATININE: 2.67 mg/dL — AB (ref 0.61–1.24)
Chloride: 85 mmol/L — ABNORMAL LOW (ref 101–111)
GFR, EST AFRICAN AMERICAN: 26 mL/min — AB (ref >60)
GFR, EST NON AFRICAN AMERICAN: 23 mL/min — AB (ref >60)
Glucose, Bld: 522 mg/dL (ref 65–99)
Potassium: 4.6 mmol/L (ref 3.5–5.1)
SODIUM: 119 mmol/L — AB (ref 135–145)

## 2016-05-01 LAB — ETHANOL: Alcohol, Ethyl (B): <5 mg/dL (ref <5)

## 2016-05-01 LAB — CK: Total CK: 701 U/L — ABNORMAL HIGH (ref 49–397)

## 2016-05-01 MED ORDER — LORATADINE 10 MG PO TABS
10.0000 mg | ORAL_TABLET | Freq: Every day | ORAL | Status: DC
  Administered 2016-05-01 – 2016-05-03 (×3): 10 mg via ORAL
  Filled 2016-05-01 (×3): qty 1

## 2016-05-01 MED ORDER — ONDANSETRON HCL 4 MG/2ML IJ SOLN
4.0000 mg | Freq: Four times a day (QID) | INTRAMUSCULAR | Status: DC | PRN
  Administered 2016-05-03 – 2016-05-05 (×2): 4 mg via INTRAVENOUS
  Filled 2016-05-01 (×2): qty 2

## 2016-05-01 MED ORDER — INSULIN ASPART 100 UNIT/ML ~~LOC~~ SOLN
0.0000 [IU] | Freq: Every day | SUBCUTANEOUS | Status: DC
  Administered 2016-05-01: 4 [IU] via SUBCUTANEOUS
  Administered 2016-05-02: 2 [IU] via SUBCUTANEOUS
  Filled 2016-05-01: qty 5
  Filled 2016-05-01: qty 4
  Filled 2016-05-01: qty 2

## 2016-05-01 MED ORDER — ACETAMINOPHEN 325 MG PO TABS
650.0000 mg | ORAL_TABLET | Freq: Four times a day (QID) | ORAL | Status: DC | PRN
  Administered 2016-05-10: 650 mg via ORAL
  Filled 2016-05-01: qty 2

## 2016-05-01 MED ORDER — SODIUM CHLORIDE 0.9 % IV BOLUS (SEPSIS)
500.0000 mL | Freq: Once | INTRAVENOUS | Status: AC
  Administered 2016-05-01: 500 mL via INTRAVENOUS

## 2016-05-01 MED ORDER — DEXTROSE 5 % IV SOLN
2.0000 g | INTRAVENOUS | Status: DC
  Administered 2016-05-02: 2 g via INTRAVENOUS
  Filled 2016-05-01 (×2): qty 2

## 2016-05-01 MED ORDER — ACETAMINOPHEN 650 MG RE SUPP: 650.0000 mg | Freq: Four times a day (QID) | RECTAL | Status: DC | PRN

## 2016-05-01 MED ORDER — INSULIN ASPART 100 UNIT/ML ~~LOC~~ SOLN
16.0000 [IU] | Freq: Once | SUBCUTANEOUS | Status: AC
  Administered 2016-05-01: 16 [IU] via SUBCUTANEOUS

## 2016-05-01 MED ORDER — INSULIN ASPART 100 UNIT/ML ~~LOC~~ SOLN
SUBCUTANEOUS | Status: AC
  Administered 2016-05-01: 16 [IU] via SUBCUTANEOUS
  Filled 2016-05-01: qty 16

## 2016-05-01 MED ORDER — INSULIN GLARGINE 100 UNIT/ML ~~LOC~~ SOLN
12.0000 [IU] | Freq: Every day | SUBCUTANEOUS | Status: DC
  Administered 2016-05-01: 12 [IU] via SUBCUTANEOUS
  Filled 2016-05-01 (×3): qty 0.12

## 2016-05-01 MED ORDER — ONDANSETRON HCL 4 MG PO TABS: 4.0000 mg | ORAL_TABLET | Freq: Four times a day (QID) | ORAL | Status: DC | PRN

## 2016-05-01 MED ORDER — SODIUM CHLORIDE 0.9% FLUSH
3.0000 mL | Freq: Two times a day (BID) | INTRAVENOUS | Status: DC
  Administered 2016-05-02 – 2016-05-12 (×16): 3 mL via INTRAVENOUS

## 2016-05-01 MED ORDER — INSULIN ASPART 100 UNIT/ML ~~LOC~~ SOLN
5.0000 [IU] | Freq: Once | SUBCUTANEOUS | Status: AC
  Administered 2016-05-01: 5 [IU] via INTRAVENOUS
  Filled 2016-05-01: qty 5

## 2016-05-01 MED ORDER — BRIMONIDINE TARTRATE-TIMOLOL 0.2-0.5 % OP SOLN: 1.0000 [drp] | Freq: Two times a day (BID) | OPHTHALMIC | Status: DC

## 2016-05-01 MED ORDER — BRIMONIDINE TARTRATE 0.2 % OP SOLN
1.0000 [drp] | Freq: Two times a day (BID) | OPHTHALMIC | Status: DC
  Administered 2016-05-01 – 2016-05-12 (×22): 1 [drp] via OPHTHALMIC
  Filled 2016-05-01: qty 5

## 2016-05-01 MED ORDER — ASPIRIN EC 81 MG PO TBEC
81.0000 mg | DELAYED_RELEASE_TABLET | Freq: Every day | ORAL | Status: DC
Start: 2016-05-01 — End: 2016-05-12
  Administered 2016-05-01 – 2016-05-12 (×12): 81 mg via ORAL
  Filled 2016-05-01 (×12): qty 1

## 2016-05-01 MED ORDER — SODIUM CHLORIDE 0.9 % IV SOLN
INTRAVENOUS | Status: DC
  Administered 2016-05-01 – 2016-05-03 (×4): via INTRAVENOUS

## 2016-05-01 MED ORDER — INSULIN ASPART 100 UNIT/ML ~~LOC~~ SOLN
0.0000 [IU] | Freq: Three times a day (TID) | SUBCUTANEOUS | Status: DC
  Administered 2016-05-02: 5 [IU] via SUBCUTANEOUS
  Administered 2016-05-02 (×2): 7 [IU] via SUBCUTANEOUS
  Administered 2016-05-03 (×2): 2 [IU] via SUBCUTANEOUS
  Filled 2016-05-01: qty 7
  Filled 2016-05-01: qty 2
  Filled 2016-05-01: qty 7
  Filled 2016-05-01: qty 5
  Filled 2016-05-01: qty 2

## 2016-05-01 MED ORDER — INSULIN GLARGINE 100 UNIT/ML ~~LOC~~ SOLN
12.0000 [IU] | Freq: Once | SUBCUTANEOUS | Status: AC
  Administered 2016-05-01: 12 [IU] via SUBCUTANEOUS
  Filled 2016-05-01: qty 0.12

## 2016-05-01 MED ORDER — DEXTROSE 5 % IV SOLN
2.0000 g | Freq: Once | INTRAVENOUS | Status: AC
  Administered 2016-05-01: 2 g via INTRAVENOUS
  Filled 2016-05-01: qty 2

## 2016-05-01 MED ORDER — DEXTROSE 5 % IV SOLN: 1.0000 g | Freq: Once | INTRAVENOUS | Status: DC

## 2016-05-01 MED ORDER — HEPARIN SODIUM (PORCINE) 5000 UNIT/ML IJ SOLN
5000.0000 [IU] | Freq: Three times a day (TID) | INTRAMUSCULAR | Status: DC
  Administered 2016-05-01 – 2016-05-12 (×30): 5000 [IU] via SUBCUTANEOUS
  Filled 2016-05-01 (×33): qty 1

## 2016-05-01 MED ORDER — FLUTICASONE PROPIONATE 50 MCG/ACT NA SUSP
1.0000 | Freq: Every day | NASAL | Status: DC
  Administered 2016-05-01 – 2016-05-03 (×3): 1 via NASAL
  Filled 2016-05-01: qty 16

## 2016-05-01 MED ORDER — SIMVASTATIN 20 MG PO TABS
20.0000 mg | ORAL_TABLET | Freq: Every day | ORAL | Status: DC
  Administered 2016-05-01 – 2016-05-02 (×2): 20 mg via ORAL
  Filled 2016-05-01 (×2): qty 1

## 2016-05-01 MED ORDER — TIMOLOL MALEATE 0.5 % OP SOLN
1.0000 [drp] | Freq: Two times a day (BID) | OPHTHALMIC | Status: DC
  Administered 2016-05-01 – 2016-05-12 (×22): 1 [drp] via OPHTHALMIC
  Filled 2016-05-01: qty 5

## 2016-05-01 NOTE — ED Notes (Signed)
Dr. Raynelle CharySchavitz notified of critical NA and glucose

## 2016-05-01 NOTE — ED Provider Notes (Signed)
[facility] Emergency Department Provider Note   ____________________________________________   First MD Initiated Contact with Patient 05/01/16 1458     (approximate)  I have reviewed the triage vital signs and the nursing notes.   HISTORY  Chief Complaint Weakness    HPI Dominic [name] is a 70 y.o. male with a history of atrial fibrillation as well as diabetes and hypertension who is presenting to the emergency department today after slipping off his couch last night laying on his left side over the past 12-15 hours. He says he does not walk at his baseline and was unable to get himself up off the floor. He was found by his home health aide who called in and he wants to bring him into the hospital today. The patient denies any complaints. Denies any pain.   Past Medical History:  Diagnosis Date  . A-fib (HCC)   . Diabetes mellitus without complication (HCC)   . Hypertension     There are no active problems to display for this patient.   Past Surgical History:  Procedure Laterality Date  . TRACHEOSTOMY      Prior to Admission medications   Medication Sig Start Date End Date Taking? Authorizing Provider  albuterol (PROVENTIL HFA;VENTOLIN HFA) 108 (90 Base) MCG/ACT inhaler Inhale into the lungs every 6 (six) hours as needed for wheezing or shortness of breath.    Historical Provider, MD  AMLODIPINE BESYLATE PO Take by mouth.    Historical Provider, MD  aspirin 81 MG tablet Take 81 mg by mouth daily.    Historical Provider, MD  brimonidine-timolol (COMBIGAN) 0.2-0.5 % ophthalmic solution Place 1 drop into both eyes every 12 (twelve) hours.    Historical Provider, MD  CHOLECALCIFEROL PO Take by mouth.    Historical Provider, MD  fluticasone (FLONASE) 50 MCG/ACT nasal spray Place into both nostrils daily.    Historical Provider, MD  fluticasone (FLOVENT HFA) 220 MCG/ACT inhaler Inhale into the lungs 2 (two) times daily.    Historical Provider, MD    Lancets MISC by Does not apply route.    Historical Provider, MD  lisinopril-hydrochlorothiazide (PRINZIDE,ZESTORETIC) 20-25 MG tablet Take 1 tablet by mouth daily.    Historical Provider, MD  loratadine (CLARITIN) 10 MG tablet Take 10 mg by mouth daily.    Historical Provider, MD  simvastatin (ZOCOR) 20 MG tablet Take 20 mg by mouth daily.    Historical Provider, MD  SIMVASTATIN PO Take by mouth.    Historical Provider, MD  Timolol Maleate PF 0.5 % SOLN Apply to eye.    Historical Provider, MD    Allergies Other  History reviewed. No pertinent family history.  Social History Social History  Substance Use Topics  . Smoking status: Unknown If Ever Smoked  . Smokeless tobacco: Not on file  . Alcohol use Not on file    Review of Systems Constitutional: No fever/chills Eyes: No visual changes. ENT: No sore throat. Cardiovascular: Denies chest pain. Respiratory: Denies shortness of breath. Gastrointestinal: No abdominal pain.  No nausea, no vomiting.  No diarrhea.  No constipation. Genitourinary: Negative for dysuria. Musculoskeletal: Negative for back pain. Skin: Negative for rash. Neurological: Negative for headaches, focal weakness or numbness.  10-point ROS otherwise negative.  ____________________________________________   PHYSICAL EXAM:  VITAL SIGNS: ED Triage Vitals [05/01/16 1451]  Enc Vitals Group     BP (!) 107/56     Pulse Rate (!) 109     Resp 18  Temp 98.5 F (36.9 C)     Temp Source Oral     SpO2 100 %     Weight 230 lb (104.3 kg)     Height 5\' 11"  (1.803 m)     Head Circumference      Peak Flow      Pain Score      Pain Loc      Pain Edu?      Excl. in GC?     Constitutional: Alert and oriented. Well appearing and in no acute distress. Eyes: Conjunctivae are normal. PERRL. EOMI. Head: Atraumatic. Nose: No congestion/rhinnorhea. Mouth/Throat: Mucous membranes are moist.  Oropharynx non-erythematous.  Tracheostomy in place without any  surrounding erythema, induration or pus. Neck: No stridor.   Cardiovascular: Irregularly irregular. Grossly normal heart sounds.   Respiratory: Normal respiratory effort.  No retractions. Lungs CTAB. Gastrointestinal: Soft and nontender. No distention. No CVA tenderness. Musculoskeletal: No lower extremity tenderness nor edema.  No joint effusions. Neurologic:  Normal speech and language. No gross focal neurologic deficits are appreciated. Skin:  Skin is warm, dry and intact. No rash noted. Psychiatric: Mood and affect are normal. Speech and behavior are normal.  ____________________________________________   LABS (all labs ordered are listed, but only abnormal results are displayed)  Labs Reviewed  BASIC METABOLIC PANEL - Abnormal; Notable for the following:       Result Value   Sodium 119 (*)    Chloride 85 (*)    CO2 20 (*)    Glucose, Bld 522 (*)    BUN 44 (*)    Creatinine, Ser 2.67 (*)    GFR calc non Af Amer 23 (*)    GFR calc Af Amer 26 (*)    All other components within normal limits  CBC - Abnormal; Notable for the following:    WBC 23.7 (*)    Hemoglobin 11.1 (*)    HCT 34.1 (*)    MCV 73.0 (*)    MCH 23.7 (*)    All other components within normal limits  CK - Abnormal; Notable for the following:    Total CK 701 (*)    All other components within normal limits  GLUCOSE, CAPILLARY - Abnormal; Notable for the following:    Glucose-Capillary 499 (*)    All other components within normal limits  URINALYSIS COMPLETEWITH MICROSCOPIC ([facility] ONLY)  ETHANOL  CBG MONITORING, ED   ____________________________________________  EKG  ED ECG REPORT I, Schaevitz,  Teena Irani, the attending physician, personally viewed and interpreted this ECG.   Date: 05/01/2016  EKG Time: 1450  Rate: 102  Rhythm: atrial fibrillation, rate 102  Axis: Normal  Intervals:none  ST&T Change: No ST segment elevation or depression. No abnormal T-wave  inversion.  ____________________________________________  RADIOLOGY   ____________________________________________   PROCEDURES  Procedure(s) performed:   Procedures  Critical Care performed:  CRITICAL CARE Performed by: Arelia Longest   Total critical care time: 35 minutes  Critical care time was exclusive of separately billable procedures and treating other patients.  Critical care was necessary to treat or prevent imminent or life-threatening deterioration.  Critical care was time spent personally by me on the following activities: development of treatment plan with patient and/or surrogate as well as nursing, discussions with consultants, evaluation of patient's response to treatment, examination of patient, obtaining history from patient or surrogate, ordering and performing treatments and interventions, ordering and review of laboratory studies, ordering and review of radiographic studies, pulse oximetry and re-evaluation  of patient's condition.  ____________________________________________   INITIAL IMPRESSION / ASSESSMENT AND PLAN / ED COURSE  Pertinent labs & imaging results that were available during my care of the patient were reviewed by me and considered in my medical decision making (see chart for details).  Patient found to be hyponatremic with hyperglycemia and renal failure. Also with mild rhabdo. We'll continue fluids as well as give insulin. Pending urinalysis at this time. Signed out to Dr. Nemiah Commander. Patient understanding of plan and willing to comply with admission.  Clinical Course     ____________________________________________   FINAL CLINICAL IMPRESSION(S) / ED DIAGNOSES  Hyponatremia. Rhabdomyolysis. Acute renal failure. Hyper glycemia.    NEW MEDICATIONS STARTED DURING THIS VISIT:  New Prescriptions   No medications on file     Note:  This document was prepared using Dragon voice recognition software and may include  unintentional dictation errors.    Myrna Blazer, MD 05/01/16 [phone]
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
## 2016-05-01 NOTE — Progress Notes (Signed)
70 yo bm admitted to room 236 via stretcher from ED with sepis.  A&O x3, gait unsteady.  No distress on ra.  Trach with trach collar inplace, pt very congested.  Lungs diminished lower lobes bil.  Cardiac monitor placed on pt and verified.  Pt denies chest pain.  Skin intact, verified with Shanda BumpsJessica, RN.  Oriented to room and surroundings.  SL lt ac flushes well.  Oriented to room and surroundings, POC reviewed with pt and care giver

## 2016-05-01 NOTE — ED Triage Notes (Signed)
Pt arrives via EMS from home, states his aid came in to find him on the floor and couldn't get up so called for help, pt states he fell last night and has been on the floor all night, pt arrives with trach in place, denies any pain but states weakness

## 2016-05-01 NOTE — Consult Note (Signed)
Pharmacy Antibiotic Note  Dominic ChamberJohn H [name] is a 70 y.o. male admitted on 05/01/2016 with sepsis and UTI.  Pharmacy has been consulted for ceftriaxone dosing. Patient admitted with sepsis, has signs of UTI Plan: ceftriaxone 2g q 24hr until bacteremia can be ruled out, then 1g q 24  Height: 5\' 11"  (180.3 cm) Weight: 230 lb (104.3 kg) IBW/kg (Calculated) : 75.3  Temp (24hrs), Avg:98.5 F (36.9 C), Min:98.5 F (36.9 C), Max:98.5 F (36.9 C)   Recent Labs Lab 05/01/16 1454  WBC 23.7*  CREATININE 2.67*    Estimated Creatinine Clearance: 31.6 mL/min (by C-G formula based on SCr of 2.67 mg/dL).    Allergies  Allergen Reactions  . Other     Oatmeal grits    Antimicrobials this admission: ceftriaxone 8/31 >>    Dose adjustments this admission:   Microbiology results: 8/31 BCx:  8/31 UCx:     Thank you for allowing pharmacy to be a part of this patient's care.  Dominic [name] 05/01/2016 4:35 PM

## 2016-05-01 NOTE — ED Notes (Signed)
Admitting MD notified of bladder scan and no urine output at this time

## 2016-05-01 NOTE — ED Notes (Signed)
Pt resting in bed, resp even and unlabored 

## 2016-05-02 ENCOUNTER — Inpatient Hospital Stay: Payer: Medicare Other

## 2016-05-02 LAB — BASIC METABOLIC PANEL
ANION GAP: 11 (ref 5–15)
Anion gap: 11 (ref 5–15)
BUN: 52 mg/dL — AB (ref 6–20)
BUN: 60 mg/dL — AB (ref 6–20)
CALCIUM: 8.6 mg/dL — AB (ref 8.9–10.3)
CALCIUM: 8.6 mg/dL — AB (ref 8.9–10.3)
CHLORIDE: 90 mmol/L — AB (ref 101–111)
CO2: 23 mmol/L (ref 22–32)
CO2: 23 mmol/L (ref 22–32)
CREATININE: 3.09 mg/dL — AB (ref 0.61–1.24)
CREATININE: 3.52 mg/dL — AB (ref 0.61–1.24)
Chloride: 89 mmol/L — ABNORMAL LOW (ref 101–111)
GFR calc Af Amer: 22 mL/min — ABNORMAL LOW (ref >60)
GFR calc non Af Amer: 16 mL/min — ABNORMAL LOW (ref >60)
GFR, EST AFRICAN AMERICAN: 19 mL/min — AB (ref >60)
GFR, EST NON AFRICAN AMERICAN: 19 mL/min — AB (ref >60)
GLUCOSE: 322 mg/dL — AB (ref 65–99)
GLUCOSE: 333 mg/dL — AB (ref 65–99)
Potassium: 3.8 mmol/L (ref 3.5–5.1)
Potassium: 4 mmol/L (ref 3.5–5.1)
Sodium: 123 mmol/L — ABNORMAL LOW (ref 135–145)
Sodium: 124 mmol/L — ABNORMAL LOW (ref 135–145)

## 2016-05-02 LAB — CBC
HCT: 33 % — ABNORMAL LOW (ref 40.0–52.0)
Hemoglobin: 10.9 g/dL — ABNORMAL LOW (ref 13.0–18.0)
MCH: 23.5 pg — AB (ref 26.0–34.0)
MCHC: 33.2 g/dL (ref 32.0–36.0)
MCV: 71 fL — AB (ref 80.0–100.0)
PLATELETS: 79 10*3/uL — AB (ref 150–440)
RBC: 4.65 MIL/uL (ref 4.40–5.90)
RDW: 14.1 % (ref 11.5–14.5)
WBC: 17.8 10*3/uL — ABNORMAL HIGH (ref 3.8–10.6)

## 2016-05-02 LAB — BLOOD CULTURE ID PANEL (REFLEXED)
ACINETOBACTER BAUMANNII: NOT DETECTED
CANDIDA ALBICANS: NOT DETECTED
CANDIDA GLABRATA: NOT DETECTED
CANDIDA PARAPSILOSIS: NOT DETECTED
CANDIDA TROPICALIS: NOT DETECTED
Candida krusei: NOT DETECTED
Carbapenem resistance: NOT DETECTED
ENTEROBACTER CLOACAE COMPLEX: NOT DETECTED
ENTEROBACTERIACEAE SPECIES: DETECTED — AB
ESCHERICHIA COLI: NOT DETECTED
Enterococcus species: NOT DETECTED
HAEMOPHILUS INFLUENZAE: NOT DETECTED
KLEBSIELLA PNEUMONIAE: DETECTED — AB
Klebsiella oxytoca: NOT DETECTED
Listeria monocytogenes: NOT DETECTED
Neisseria meningitidis: NOT DETECTED
PROTEUS SPECIES: NOT DETECTED
PSEUDOMONAS AERUGINOSA: NOT DETECTED
STREPTOCOCCUS AGALACTIAE: NOT DETECTED
STREPTOCOCCUS PYOGENES: NOT DETECTED
STREPTOCOCCUS SPECIES: NOT DETECTED
Serratia marcescens: NOT DETECTED
Staphylococcus aureus (BCID): NOT DETECTED
Staphylococcus species: NOT DETECTED
Streptococcus pneumoniae: NOT DETECTED

## 2016-05-02 LAB — URINALYSIS COMPLETE WITH MICROSCOPIC (ARMC ONLY)
Glucose, UA: 150 mg/dL — AB
KETONES UR: NEGATIVE mg/dL
NITRITE: NEGATIVE
PH: 5 (ref 5.0–8.0)
Protein, ur: 100 mg/dL — AB
SPECIFIC GRAVITY, URINE: 1.021 (ref 1.005–1.030)

## 2016-05-02 LAB — GLUCOSE, CAPILLARY
GLUCOSE-CAPILLARY: 317 mg/dL — AB (ref 65–99)
GLUCOSE-CAPILLARY: 217 mg/dL — AB (ref 65–99)
Glucose-Capillary: 318 mg/dL — ABNORMAL HIGH (ref 65–99)
Glucose-Capillary: 326 mg/dL — ABNORMAL HIGH (ref 65–99)
Glucose-Capillary: 251 mg/dL — ABNORMAL HIGH (ref 65–99)

## 2016-05-02 LAB — EXPECTORATED SPUTUM ASSESSMENT W REFEX TO RESP CULTURE: SPECIAL REQUESTS: NORMAL

## 2016-05-02 LAB — HEMOGLOBIN A1C: Hgb A1c MFr Bld: 10.2 % — ABNORMAL HIGH (ref 4.0–6.0)

## 2016-05-02 LAB — EXPECTORATED SPUTUM ASSESSMENT W GRAM STAIN, RFLX TO RESP C

## 2016-05-02 MED ORDER — INSULIN GLARGINE 100 UNIT/ML ~~LOC~~ SOLN
26.0000 [IU] | Freq: Every day | SUBCUTANEOUS | Status: DC
  Administered 2016-05-02 – 2016-05-03 (×2): 26 [IU] via SUBCUTANEOUS
  Filled 2016-05-02 (×4): qty 0.26

## 2016-05-02 MED ORDER — INSULIN ASPART 100 UNIT/ML ~~LOC~~ SOLN
5.0000 [IU] | Freq: Three times a day (TID) | SUBCUTANEOUS | Status: DC
  Administered 2016-05-02 – 2016-05-03 (×3): 5 [IU] via SUBCUTANEOUS
  Filled 2016-05-02 (×2): qty 5

## 2016-05-02 MED ORDER — INSULIN STARTER KIT- PEN NEEDLES (ENGLISH)
1.0000 | Freq: Once | Status: AC
Start: 2016-05-02 — End: 2016-05-02
  Administered 2016-05-02: 1
  Filled 2016-05-02: qty 1

## 2016-05-02 MED ORDER — IPRATROPIUM-ALBUTEROL 0.5-2.5 (3) MG/3ML IN SOLN
3.0000 mL | Freq: Four times a day (QID) | RESPIRATORY_TRACT | Status: DC
  Administered 2016-05-02 – 2016-05-12 (×37): 3 mL via RESPIRATORY_TRACT
  Filled 2016-05-02 (×38): qty 3

## 2016-05-02 MED ORDER — IPRATROPIUM-ALBUTEROL 0.5-2.5 (3) MG/3ML IN SOLN: 3.0000 mL | Freq: Four times a day (QID) | RESPIRATORY_TRACT | Status: DC

## 2016-05-02 MED ORDER — LIVING WELL WITH DIABETES BOOK
Freq: Once | Status: AC
  Administered 2016-05-02: 10:00:00
  Filled 2016-05-02: qty 1

## 2016-05-02 MED ORDER — BUDESONIDE 0.25 MG/2ML IN SUSP
0.2500 mg | Freq: Two times a day (BID) | RESPIRATORY_TRACT | Status: DC
  Administered 2016-05-02 – 2016-05-12 (×19): 0.25 mg via RESPIRATORY_TRACT
  Filled 2016-05-02 (×20): qty 2

## 2016-05-02 NOTE — Progress Notes (Signed)
Pt alert and oriented. Very tired and wants to lie HOB down to sleep but becomes more congested and needs to sit up. VS stable. Family updated and at bedside.

## 2016-05-02 NOTE — Care Management (Addendum)
Briefly met with patient at bedside. He is congested and coughing. Trach in place. Patient legally blind. Admitted with sepsis and a fall. Layed on the floor greater than 12 hours. Found by aid. States he has no home health but a [facility] aid from 9-12 each day. No home O2. PCP is Dr. Lennox Grumbles. May benefit from home health. Following progression.

## 2016-05-02 NOTE — Progress Notes (Signed)
Patient ID: Dominic [name], male   DOB: 1946-08-02, 70 y.o.   MRN: [id number]  [facility] PROGRESS NOTE  Dominic [name] [id number]:[id number] DOB: 10-17-1945 DOA: 05/01/2016 PCP: No primary care provider on file.  HPI/Subjective: Patient with cough and sputum production. He's had poor appetite and not eating well. He lives alone.  Objective: Vitals:   05/02/16 0257 05/02/16 1055  BP: (!) 108/45 106/76  Pulse: (!) 56 92  Resp: (!) 24 19  Temp: 98.4 F (36.9 C) 97.8 F (36.6 C)    Intake/Output Summary (Last 24 hours) at 05/02/16 1526 Last data filed at 05/02/16 1432  Gross per 24 hour  Intake          1146.66 ml  Output              100 ml  Net          1046.66 ml   Filed Weights   05/01/16 1451  Weight: 104.3 kg (230 lb)    ROS: Review of Systems  Constitutional: Negative for chills and fever.  Eyes: Negative for blurred vision.  Respiratory: Positive for cough and shortness of breath.   Cardiovascular: Negative for chest pain.  Gastrointestinal: Negative for abdominal pain, constipation, diarrhea, nausea and vomiting.  Genitourinary: Negative for dysuria.  Musculoskeletal: Negative for joint pain.  Neurological: Negative for dizziness and headaches.   Exam: Physical Exam  HENT:  Nose: No mucosal edema.  Mouth/Throat: No oropharyngeal exudate or posterior oropharyngeal edema.  Eyes: Conjunctivae and lids are normal.  Left eye clouded over.  Neck: No JVD present. Carotid bruit is not present. No edema present. No thyroid mass and no thyromegaly present.  Cardiovascular: S1 normal and S2 normal.  Exam reveals no gallop.   No murmur heard. Pulses:      Dorsalis pedis pulses are 2+ on the right side, and 2+ on the left side.  Respiratory: No respiratory distress. He has decreased breath sounds in the right lower field and the left lower field. He has wheezes in the right middle field, the right lower field, the left middle field and the left lower field. He has no  rhonchi. He has no rales.  GI: Soft. Bowel sounds are normal. There is no tenderness.  Musculoskeletal:       Right ankle: He exhibits no swelling.       Left ankle: He exhibits no swelling.  Lymphadenopathy:    He has no cervical adenopathy.  Neurological: He is alert. No cranial nerve deficit.  Skin: Skin is warm. No rash noted. Nails show no clubbing.  Psychiatric: He has a normal mood and affect.      Data Reviewed: Basic Metabolic Panel:  Recent Labs Lab 05/01/16 1454 05/01/16 2308 05/02/16 0519  NA 119* 123* 124*  K 4.6 3.8 4.0  CL 85* 89* 90*  CO2 20* 23 23  GLUCOSE 522* 322* 333*  BUN 44* 52* 60*  CREATININE 2.67* 3.09* 3.52*  CALCIUM 8.9 8.6* 8.6*   CBC:  Recent Labs Lab 05/01/16 1454 05/02/16 0519  WBC 23.7* 17.8*  HGB 11.1* 10.9*  HCT 34.1* 33.0*  MCV 73.0* 71.0*  PLT 82* 79*   Cardiac Enzymes:  Recent Labs Lab 05/01/16 1454  CKTOTAL 701*   CBG:  Recent Labs Lab 05/01/16 1943 05/01/16 2220 05/02/16 0255 05/02/16 0716 05/02/16 1053  GLUCAP 414* 331* 318* 326* 317*    Recent Results (from the past 240 hour(s))  CULTURE, BLOOD (ROUTINE X 2) w Reflex to ID  Panel     Status: None (Preliminary result)   Collection Time: 05/01/16  4:24 PM  Result Value Ref Range Status   Specimen Description   Final    BLOOD RIGHT ANTECUBITAL Performed at [facility]    Special Requests   Final    [place]  AER 6 ML ANA 3 ML   Culture  Setup Time   Final    Organism ID to follow GRAM NEGATIVE RODS IN BOTH AEROBIC AND ANAEROBIC BOTTLES CRITICAL RESULT CALLED TO, READ BACK BY AND VERIFIED WITH: CHRISTINE KATSOUDAS ON 05/02/16 AT [place] BY QSD    Culture   Final    GRAM NEGATIVE RODS IN BOTH AEROBIC AND ANAEROBIC BOTTLES   Report Status PENDING  Incomplete  CULTURE, BLOOD (ROUTINE X 2) w Reflex to ID Panel     Status: None (Preliminary result)   Collection Time: 05/01/16  4:24 PM  Result Value Ref Range Status    Specimen Description   Final    BLOOD LEFT ANTECUBITAL Performed at [facility]    Special Requests   Final    BOTTLES DRAWN AEROBIC AND ANAEROBIC  AER 6 ML ANA 6 ML   Culture  Setup Time   Final    GRAM NEGATIVE RODS IN BOTH AEROBIC AND ANAEROBIC BOTTLES    Culture GRAM NEGATIVE RODS  Final   Report Status PENDING  Incomplete  Blood Culture ID Panel (Reflexed)     Status: Abnormal   Collection Time: 05/01/16  4:24 PM  Result Value Ref Range Status   Enterococcus species NOT DETECTED NOT DETECTED Final   Listeria monocytogenes NOT DETECTED NOT DETECTED Final   Staphylococcus species NOT DETECTED NOT DETECTED Final   Staphylococcus aureus NOT DETECTED NOT DETECTED Final   Streptococcus species NOT DETECTED NOT DETECTED Final   Streptococcus agalactiae NOT DETECTED NOT DETECTED Final   Streptococcus pneumoniae NOT DETECTED NOT DETECTED Final   Streptococcus pyogenes NOT DETECTED NOT DETECTED Final   Acinetobacter baumannii NOT DETECTED NOT DETECTED Final   Enterobacteriaceae species DETECTED (A) NOT DETECTED Final    Comment: CRITICAL RESULT CALLED TO, READ BACK BY AND VERIFIED WITH: [place] ON 05/02/16 AT 0744 QSD    Enterobacter cloacae complex NOT DETECTED NOT DETECTED Final   Escherichia coli NOT DETECTED NOT DETECTED Final   Klebsiella oxytoca NOT DETECTED NOT DETECTED Final   Klebsiella pneumoniae DETECTED (A) NOT DETECTED Final    Comment: CRITICAL RESULT CALLED TO, READ BACK BY AND VERIFIED WITH: CHRISTINE KATSOUDAS ON 05/02/16 AT 0744 QSD    Proteus species NOT DETECTED NOT DETECTED Final   Serratia marcescens NOT DETECTED NOT DETECTED Final   Carbapenem resistance NOT DETECTED NOT DETECTED Final   Haemophilus influenzae NOT DETECTED NOT DETECTED Final   Neisseria meningitidis NOT DETECTED NOT DETECTED Final   Pseudomonas aeruginosa NOT DETECTED NOT DETECTED Final   Candida albicans NOT DETECTED NOT DETECTED Final   Candida glabrata NOT DETECTED NOT  DETECTED Final   Candida krusei NOT DETECTED NOT DETECTED Final   Candida parapsilosis NOT DETECTED NOT DETECTED Final   Candida tropicalis NOT DETECTED NOT DETECTED Final  Culture, expectorated sputum-assessment     Status: None   Collection Time: 05/02/16  1:30 PM  Result Value Ref Range Status   Specimen Description EXPECTORATED SPUTUM  Final   Special Requests Normal  Final   Sputum evaluation THIS SPECIMEN IS ACCEPTABLE FOR SPUTUM CULTURE  Final   Report Status 05/02/2016 FINAL  Final     Studies: US Renal  Result Date: 05/02/2016 CLINICAL DATA:  Acute renal failure. History of hypertension, diabetes, and atrial fibrillation. EXAM: RENAL / URINARY TRACT ULTRASOUND COMPLETE COMPARISON:  KUB of November 20, 2012 FINDINGS: Right Kidney: Length: 12.0 cm. The renal cortical echotexture remains lower than that of the adjacent liver. There is no focal mass or hydronephrosis. No stones are observed. Left Kidney: Length: 10.6 cm. The renal cortical echotexture is similar to that on the right. There is no focal mass or hydronephrosis. No stones are observed. Bladder: The urinary bladder is only partially distended. IMPRESSION: Limited visualization of the urinary bladder due to underdistention. Normal appearance of the kidneys with no evidence of obstruction. Electronically Signed   By: David  Martinique M.D.   On: 05/02/2016 09:59   Dg Chest Port 1 View  Result Date: 05/01/2016 CLINICAL DATA:  Hypoxia. Not on any oxygen at home, feeling weak for 3 weeks. Chills, weakness, nausea, vomiting and diarrhea. Cough and shortness of breath. EXAM: PORTABLE CHEST 1 VIEW COMPARISON:  05/01/2016 FINDINGS: There is a tracheostomy tube with the tip 4.6 cm above the carina. There is no focal parenchymal opacity. There is no pleural effusion or pneumothorax. The heart and mediastinal contours are stable. There is thoracic aortic atherosclerosis. The osseous structures are unremarkable. IMPRESSION: Tracheostomy tube with the  tip 4.6 cm above the carina. No active cardiopulmonary disease. Aortic Atherosclerosis (ICD10-170.0) Electronically Signed   By: Kathreen Devoid   On: 05/01/2016 16:37    Scheduled Meds: . aspirin EC  81 mg Oral Daily  . brimonidine  1 drop Both Eyes Q12H   And  . timolol  1 drop Both Eyes Q12H  . cefTRIAXone (ROCEPHIN)  IV  2 g Intravenous Q24H  . fluticasone  1 spray Each Nare Daily  . heparin  5,000 Units Subcutaneous Q8H  . insulin aspart  0-5 Units Subcutaneous QHS  . insulin aspart  0-9 Units Subcutaneous TID WC  . insulin aspart  5 Units Subcutaneous TID WC  . insulin glargine  26 Units Subcutaneous QHS  . insulin starter kit- pen needles  1 kit Other Once  . loratadine  10 mg Oral Daily  . simvastatin  20 mg Oral Daily  . sodium chloride flush  3 mL Intravenous Q12H   Continuous Infusions: . sodium chloride 100 mL/hr at 05/02/16 0427    Assessment/Plan:  1. Sepsis with Klebsiella pneumonia. On Rocephin 2 g IV every 24 hours. Send sputum culture since also having respiratory issues. Chest x-ray read as negative. Repeat chest x-ray tomorrow. 2. Acute kidney injury and hyponatremia secondary to dehydration. Continue normal saline hydration. Likely this is ATN. 3. Relative hypotension hold Norvasc. 4. Thrombocytopenia likely secondary to sepsis. 5. Uncontrolled type 2 diabetes mellitus on Lantus insulin and sliding scale. 6. Mild rhabdomyolysis stop simvastatin 7. Hyperlipidemia unspecified stop simvastatin. 8. Weakness physical therapy evaluation  Code Status:     Code Status Orders        Start     Ordered   05/01/16 1757  Full code  Continuous     05/01/16 1756    Code Status History    Date Active Date Inactive Code Status Order ID Comments User Context   This patient has a current code status but no historical code status.     Disposition Plan: To be determined  Consultants:  Nephrology  Antibiotics:  Rocephin  Time spent: 25 minutes  Loletha Grayer  [employer]

## 2016-05-02 NOTE — Progress Notes (Addendum)
PHARMACY - PHYSICIAN COMMUNICATION CRITICAL VALUE ALERT - BLOOD CULTURE IDENTIFICATION (BCID)  Results for orders placed or performed during the hospital encounter of 05/01/16  Blood Culture ID Panel (Reflexed) (Collected: 05/01/2016  4:24 PM)  Result Value Ref Range   Enterococcus species NOT DETECTED NOT DETECTED   Listeria monocytogenes NOT DETECTED NOT DETECTED   Staphylococcus species NOT DETECTED NOT DETECTED   Staphylococcus aureus NOT DETECTED NOT DETECTED   Streptococcus species NOT DETECTED NOT DETECTED   Streptococcus agalactiae NOT DETECTED NOT DETECTED   Streptococcus pneumoniae NOT DETECTED NOT DETECTED   Streptococcus pyogenes NOT DETECTED NOT DETECTED   Acinetobacter baumannii NOT DETECTED NOT DETECTED   Enterobacteriaceae species DETECTED (A) NOT DETECTED   Enterobacter cloacae complex NOT DETECTED NOT DETECTED   Escherichia coli NOT DETECTED NOT DETECTED   Klebsiella oxytoca NOT DETECTED NOT DETECTED   Klebsiella pneumoniae DETECTED (A) NOT DETECTED   Proteus species NOT DETECTED NOT DETECTED   Serratia marcescens NOT DETECTED NOT DETECTED   Carbapenem resistance NOT DETECTED NOT DETECTED   Haemophilus influenzae NOT DETECTED NOT DETECTED   Neisseria meningitidis NOT DETECTED NOT DETECTED   Pseudomonas aeruginosa NOT DETECTED NOT DETECTED   Candida albicans NOT DETECTED NOT DETECTED   Candida glabrata NOT DETECTED NOT DETECTED   Candida krusei NOT DETECTED NOT DETECTED   Candida parapsilosis NOT DETECTED NOT DETECTED   Candida tropicalis NOT DETECTED NOT DETECTED    Name of physician (or Provider) Contacted: Richard Wieting  Changes to prescribed antibiotics required: Patient currently on Rocephin 2 g IV q24 hours which is appropriate.   Hendrik Donath D 05/02/2016  7:55 AM

## 2016-05-02 NOTE — Progress Notes (Signed)
Inpatient Diabetes Program Recommendations  AACE/ADA: New Consensus Statement on Inpatient Glycemic Control (2015)  Target Ranges:  Prepandial:   less than 140 mg/dL      Peak postprandial:   less than 180 mg/dL (1-2 hours)      Critically ill patients:  140 - 180 mg/dL   Results for Dominic [name], Dominic [name] (MRN [id number]) as of 05/02/2016 11:12  Ref. Range 05/01/2016 14:59 05/01/2016 17:27 05/01/2016 19:43 05/01/2016 22:20 05/02/2016 02:55 05/02/2016 07:16 05/02/2016 10:53  Glucose-Capillary Latest Ref Range: 65 - 99 mg/dL [id number] ([name]) [id number] ([name]) [id number] ([name]) 331 ([name]) 318 ([name]) 326 ([name]) 317 ([name])   Review of Glycemic Control  Diabetes history: DM2 Outpatient Diabetes medications: None Current orders for Inpatient glycemic control: Lantus 12 units QHS, Novolog 0-9 units TID with meals, Novolog 0-5 units QHS  Inpatient Diabetes Program Recommendations: Insulin - Basal: In reveiwing chart, note patient received Lantus 12 units at 19:53 on 05/01/16 and Lantus 12 units at 23:00 on 05/01/16. Patient is ordered Lantus 12 units QHS but has already received a total of Lantus 24 units last night. Please consider increasing Lantus to 26 units QHS (based on 104 kg x 0.25 units). Insulin - Meal Coverage: Please consider ordering Novolog 5 units TID with meals for meal coverage if patient eats at least 50% of meals. HgbA1C: A1C 10.2% on 05/01/16 indicating an average glucose of 246 mg/dl over the past 2-3 months. Patient will require outpatient DM medication(s) and will need to follow up with PCP as soon as possible regarding diabetes.  NOTE: Spoke with patient about diabetes and home regimen for diabetes control. Patient reports that he is followed by PCP for diabetes management and he does not recall the last time that his doctor checked his blood work. In talking with patient he states that he use to take "a pill for diabetes" but he does not recall the name of it. Patient reports that he has a glucometer at home but he does not routinely check  his glucose. He states that he has a nurse aide come to his home daily and the aide will check his glucose sometimes. Inquired if patient knew glucose or A1C goals for a person with DM and he shook his head side to side to indicate "No" that he did not know them. Discussed glucose and A1C goals.  Discussed A1C results (10.2% on 05/01/16) and explained that his current A1C indicates an average glucose of 246 mg/dl over the past 2-3 months.  Discussed importance of checking CBGs and maintaining good CBG control to prevent long-term and short-term complications. Explained how hyperglycemia leads to damage within blood vessels which lead to the common complications seen with uncontrolled diabetes. Stressed to the patient the importance of improving glycemic control to prevent further complications from uncontrolled diabetes. Discussed impact of nutrition, exercise, stress, sickness, and medications on diabetes control. Patient denies any recent steroid use. Explained to the patient that he will need to be prescribed DM medication(s) and he will need to take the medication to improve diabetes control. Patient is open to taking DM medication(s) as an outpatient. 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 If patient had to be on insulin for diabetes control as an outpatient, not sure of safety with him self injecting insulin without the assistance from someone else. However, if patient needs insulin as an outpatient then insulin pens would be safer because patient could listen for clicks (with 1  click being 1 unit of insulin).  Encouraged patient to check his glucose as directed by his doctor and take DM medication if prescribed at time of discharge. Patient verbalized understanding of information discussed and he states that he has no further questions at this time related to diabetes.  NURSING: Please use each patient interaction to provide diabetes education. Please  review Living Well with Diabetes booklet with the patient (patient is legally blind in left eye and partial vision in right eye),  and instruct on insulin administration (in case he has to be discharged on insulin). Please allow patient to be actively engaged with diabetes management by allowing patient to check own glucose and self-administer insulin injections.   Thanks, Orlando Penner, RN, MSN, CDE Diabetes Coordinator Inpatient Diabetes Program [phone] (Team Pager) [phone] ([facility] office) [phone] [facility] office) [phone] [facility] office)
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
## 2016-05-02 NOTE — Care Management Important Message (Signed)
Important Message  Patient Details  Name: Dominic ChamberJohn H Linden MRN: [id number] Date of Birth: 11/24/1945   Medicare Important Message Given:  Yes    Marily MemosLisa M Tamirah George, RN 05/02/2016, 9:45 AM

## 2016-05-02 NOTE — Consult Note (Signed)
Pharmacy Antibiotic Note  Dominic ChamberJohn H [name] is a [age] y.o. male admitted on 05/01/2016 with sepsis and UTI.  Pharmacy has been consulted for ceftriaxone dosing. Patient admitted with sepsis secondary to bacteremia.   Biofire result Klebsiella pneumoniae  Plan: Continue Rocephin 2 g IV q24 hours.  Height: 5\' 11"  (180.3 cm) Weight: 230 lb (104.3 kg) IBW/kg (Calculated) : 75.3  Temp (24hrs), Avg:98.2 F (36.8 C), Min:97.8 F (36.6 C), Max:98.5 F (36.9 C)   Recent Labs Lab 05/01/16 1454 05/01/16 2308 05/02/16 0519  WBC 23.7*  --  17.8*  CREATININE 2.67* 3.09* 3.52*    Estimated Creatinine Clearance: 24 mL/min (by C-G formula based on SCr of 3.52 mg/dL).    Allergies  Allergen Reactions  . Other     Oatmeal grits  . Penicillins Hives, Rash and Other (See Comments)    Has patient had a PCN reaction causing immediate rash, facial/tongue/throat swelling, SOB or lightheadedness with hypotension: possibly. Pt isn't sure Has patient had a PCN reaction causing severe rash involving mucus membranes or skin necrosis: No Has patient had a PCN reaction that required hospitalization No Has patient had a PCN reaction occurring within the last 10 years: No If all of the above answers are "NO", then may proceed with Cephalosporin use.     Antimicrobials this admission: ceftriaxone 8/31 >>    Dose adjustments this admission:   Microbiology results: 8/31 BCx:  8/31 UCx:     Thank you for allowing pharmacy to be a part of this patient's care.  Dominic [name] D 05/02/2016 10:39 AM

## 2016-05-02 NOTE — Progress Notes (Signed)
MD notified via text about u/a results. MD notified RN to order add on urine culture. I will continue to assess.

## 2016-05-02 NOTE — Progress Notes (Signed)
RT placed new trach ties on to secure trach per request.

## 2016-05-03 ENCOUNTER — Inpatient Hospital Stay: Payer: Medicare Other

## 2016-05-03 DIAGNOSIS — A419 Sepsis, unspecified organism: Secondary | ICD-10-CM | POA: Diagnosis not present

## 2016-05-03 LAB — BASIC METABOLIC PANEL
Anion gap: 10 (ref 5–15)
BUN: 76 mg/dL — AB (ref 6–20)
CALCIUM: 8.2 mg/dL — AB (ref 8.9–10.3)
CO2: 19 mmol/L — ABNORMAL LOW (ref 22–32)
CREATININE: 3.96 mg/dL — AB (ref 0.61–1.24)
Chloride: 97 mmol/L — ABNORMAL LOW (ref 101–111)
GFR calc Af Amer: 16 mL/min — ABNORMAL LOW (ref >60)
GFR, EST NON AFRICAN AMERICAN: 14 mL/min — AB (ref >60)
Glucose, Bld: 179 mg/dL — ABNORMAL HIGH (ref 65–99)
Potassium: 4.1 mmol/L (ref 3.5–5.1)
SODIUM: 126 mmol/L — AB (ref 135–145)

## 2016-05-03 LAB — BLOOD GAS, ARTERIAL
ACID-BASE DEFICIT: 7.5 mmol/L — AB (ref 0.0–2.0)
Bicarbonate: 16.9 mmol/L — ABNORMAL LOW (ref 20.0–28.0)
FIO2: 0.21
O2 Saturation: 94.7 %
PCO2 ART: 30 mmHg — AB (ref 32.0–48.0)
PH ART: 7.36 (ref 7.350–7.450)
Patient temperature: 37
pO2, Arterial: 77 mmHg — ABNORMAL LOW (ref 83.0–108.0)

## 2016-05-03 LAB — CK: Total CK: 96 U/L (ref 49–397)

## 2016-05-03 LAB — HEPATIC FUNCTION PANEL
ALK PHOS: 328 U/L — AB (ref 38–126)
ALT: 25 U/L (ref 17–63)
AST: 34 U/L (ref 15–41)
Albumin: 2.2 g/dL — ABNORMAL LOW (ref 3.5–5.0)
BILIRUBIN INDIRECT: 2.6 mg/dL — AB (ref 0.3–0.9)
BILIRUBIN TOTAL: 8.3 mg/dL — AB (ref 0.3–1.2)
Bilirubin, Direct: 5.7 mg/dL — ABNORMAL HIGH (ref 0.1–0.5)
TOTAL PROTEIN: 6.6 g/dL (ref 6.5–8.1)

## 2016-05-03 LAB — MAGNESIUM: Magnesium: 2.1 mg/dL (ref 1.7–2.4)

## 2016-05-03 LAB — TROPONIN I
Troponin I: 0.04 ng/mL (ref <0.03)
Troponin I: 0.06 ng/mL (ref <0.03)

## 2016-05-03 LAB — LACTIC ACID, PLASMA
Lactic Acid, Venous: 1.4 mmol/L (ref 0.5–1.9)
Lactic Acid, Venous: 1.5 mmol/L (ref 0.5–1.9)

## 2016-05-03 LAB — APTT: aPTT: 33 seconds (ref 24–36)

## 2016-05-03 LAB — PROCALCITONIN

## 2016-05-03 LAB — GLUCOSE, CAPILLARY
GLUCOSE-CAPILLARY: 184 mg/dL — AB (ref 65–99)
GLUCOSE-CAPILLARY: 124 mg/dL — AB (ref 65–99)
Glucose-Capillary: 189 mg/dL — ABNORMAL HIGH (ref 65–99)
Glucose-Capillary: 146 mg/dL — ABNORMAL HIGH (ref 65–99)

## 2016-05-03 LAB — PROTIME-INR
INR: 1.02
Prothrombin Time: 13.4 seconds (ref 11.4–15.2)

## 2016-05-03 LAB — PHOSPHORUS: Phosphorus: 2.4 mg/dL — ABNORMAL LOW (ref 2.5–4.6)

## 2016-05-03 LAB — MRSA PCR SCREENING: MRSA BY PCR: NEGATIVE

## 2016-05-03 MED ORDER — FOLIC ACID 1 MG PO TABS
1.0000 mg | ORAL_TABLET | Freq: Every day | ORAL | Status: DC
  Administered 2016-05-03: 1 mg via ORAL
  Filled 2016-05-03: qty 1

## 2016-05-03 MED ORDER — VANCOMYCIN HCL IN DEXTROSE 1-5 GM/200ML-% IV SOLN
1000.0000 mg | Freq: Once | INTRAVENOUS | Status: AC
  Administered 2016-05-03: 1000 mg via INTRAVENOUS
  Filled 2016-05-03: qty 200

## 2016-05-03 MED ORDER — DEXTROSE 5 % IV SOLN
2.0000 g | INTRAVENOUS | Status: DC
  Administered 2016-05-03 – 2016-05-04 (×2): 2 g via INTRAVENOUS
  Filled 2016-05-03 (×3): qty 2

## 2016-05-03 MED ORDER — DOPAMINE-DEXTROSE 3.2-5 MG/ML-% IV SOLN
0.0000 ug/kg/min | INTRAVENOUS | Status: DC
  Filled 2016-05-03: qty 250

## 2016-05-03 MED ORDER — SODIUM CHLORIDE 0.9 % IV BOLUS (SEPSIS)
2000.0000 mL | Freq: Once | INTRAVENOUS | Status: AC
  Administered 2016-05-03: 2000 mL via INTRAVENOUS

## 2016-05-03 MED ORDER — VANCOMYCIN HCL IN DEXTROSE 1-5 GM/200ML-% IV SOLN
1000.0000 mg | INTRAVENOUS | Status: DC
  Administered 2016-05-04: 1000 mg via INTRAVENOUS
  Filled 2016-05-03: qty 200

## 2016-05-03 MED ORDER — INSULIN ASPART 100 UNIT/ML ~~LOC~~ SOLN
0.0000 [IU] | SUBCUTANEOUS | Status: DC
  Administered 2016-05-03 – 2016-05-04 (×3): 1 [IU] via SUBCUTANEOUS
  Filled 2016-05-03 (×3): qty 1

## 2016-05-03 MED ORDER — VANCOMYCIN HCL IN DEXTROSE 1-5 GM/200ML-% IV SOLN
1000.0000 mg | INTRAVENOUS | Status: DC
  Filled 2016-05-03: qty 200

## 2016-05-03 MED ORDER — VITAMIN B-1 100 MG PO TABS
100.0000 mg | ORAL_TABLET | Freq: Every day | ORAL | Status: DC
  Administered 2016-05-03: 100 mg via ORAL
  Filled 2016-05-03: qty 1

## 2016-05-03 MED ORDER — SODIUM CHLORIDE 0.9 % IV SOLN
INTRAVENOUS | Status: DC
  Administered 2016-05-03 – 2016-05-04 (×2): via INTRAVENOUS

## 2016-05-03 MED ORDER — PHENYLEPHRINE HCL 10 MG/ML IJ SOLN
0.0000 ug/min | INTRAVENOUS | Status: DC
  Administered 2016-05-04 (×2): 20 ug/min via INTRAVENOUS
  Administered 2016-05-04: 30 ug/min via INTRAVENOUS
  Administered 2016-05-04: 2 ug/min via INTRAVENOUS
  Filled 2016-05-03 (×3): qty 1

## 2016-05-03 MED ORDER — LORAZEPAM 2 MG/ML IJ SOLN: 1.0000 mg | Freq: Four times a day (QID) | INTRAMUSCULAR | Status: DC | PRN

## 2016-05-03 MED ORDER — FOLIC ACID 5 MG/ML IJ SOLN
1.0000 mg | Freq: Every day | INTRAMUSCULAR | Status: DC
  Administered 2016-05-03: 1 mg via INTRAVENOUS
  Filled 2016-05-03 (×2): qty 0.2

## 2016-05-03 MED ORDER — ADULT MULTIVITAMIN W/MINERALS CH
1.0000 | ORAL_TABLET | Freq: Every day | ORAL | Status: DC
  Administered 2016-05-03: 1 via ORAL
  Filled 2016-05-03: qty 1

## 2016-05-03 MED ORDER — SODIUM CHLORIDE 0.9 % IV BOLUS (SEPSIS)
1000.0000 mL | Freq: Once | INTRAVENOUS | Status: AC
  Administered 2016-05-03: 1000 mL via INTRAVENOUS

## 2016-05-03 MED ORDER — THIAMINE HCL 100 MG/ML IJ SOLN
100.0000 mg | Freq: Every day | INTRAMUSCULAR | Status: DC
  Filled 2016-05-03: qty 1

## 2016-05-03 MED ORDER — LORAZEPAM 0.5 MG PO TABS: 1.0000 mg | ORAL_TABLET | Freq: Four times a day (QID) | ORAL | Status: DC | PRN

## 2016-05-03 NOTE — Progress Notes (Addendum)
Pharmacy Antibiotic Note  Dominic [name] is a 70 y.o. male admitted on 05/01/2016 with sepsis and bacteremia (KLEBSIELLA PNEUMONIAE).  Pharmacy has been consulted for Cefepime and vancomycin dosing.  Plan: Ke: 0.022     T1/2: 32 Hr     Vd: 61    Will start the patient on Vancomycin 1gm IV every 36 hours with 6 hours stack dosing. Calculated trough at Css 15. Plan for trough level prior to 5th dose. Will continue to monitor patients renal function.  Will start cefepime 2gm IV every 24 hours based on patients CrCl <1230ml/min.     Height: 5\' 11"  (180.3 cm) Weight: 230 lb (104.3 kg) IBW/kg (Calculated) : 75.3  Temp (24hrs), Avg:97.9 F (36.6 C), Min:97.8 F (36.6 C), Max:98 F (36.7 C)   Recent Labs Lab 05/01/16 1454 05/01/16 2308 05/02/16 0519 05/03/16 0628 05/03/16 1607  WBC 23.7*  --  17.8*  --   --   CREATININE 2.67* 3.09* 3.52* 3.96*  --   LATICACIDVEN  --   --   --   --  1.4    Estimated Creatinine Clearance: 21.3 mL/min (by C-G formula based on SCr of 3.96 mg/dL).    Allergies  Allergen Reactions  . Other     Oatmeal grits  . Penicillins Hives, Rash and Other (See Comments)    Has patient had a PCN reaction causing immediate rash, facial/tongue/throat swelling, SOB or lightheadedness with hypotension: possibly. Pt isn't sure Has patient had a PCN reaction causing severe rash involving mucus membranes or skin necrosis: No Has patient had a PCN reaction that required hospitalization No Has patient had a PCN reaction occurring within the last 10 years: No If all of the above answers are "NO", then may proceed with Cephalosporin use.     Antimicrobials this admission: 8/31 Cetriaxone >> 9/2 9/2 Vancomycin  >>  9/2 Cefepime>>  Dose adjustments this admission:  Microbiology results: 8/31 BCx: KLEBSIELLA PNEUMONIAE (A) 9/1 UCx: pending 9/1 Sputum: Normal 9/2 MRSA PCR: pending  Thank you for allowing pharmacy to be a part of this patient's care.  Cher NakaiSheema Nataliah Hatlestad,  PharmD Clinical Pharmacist 05/03/2016 5:26 PM

## 2016-05-03 NOTE — Progress Notes (Addendum)
Lisa Caregiver and sister was notified that pt was being moved to ICU 3. Report was called to Madelaine BhatAdam RN in ICU.

## 2016-05-03 NOTE — Progress Notes (Signed)
Unable to insert 2nd PIV.  Pt has multiple sites from Previous blood draws and failed IV attempts.  Pt has scarring to left forearm.

## 2016-05-03 NOTE — Progress Notes (Addendum)
Pt. Has not urinated this shift. Bladder scanned him and his bladder contains 92mls per bladder scanner. Pts bladder is not distended nor does he have discomfort upon palpation of bladder. Pt. Encourage to attempt to use the urinal multiple times but does not urinate. Prime paged to update about urine output. Dr. Tobi BastosPyreddy returned call and wants to continue to monitor pt at this time R/T acute renal failure. Fluids continue. Will continue to monitor pt.

## 2016-05-03 NOTE — Progress Notes (Signed)
Patient ID: Dominic [name], male   DOB: 07-05-1946, 70 y.o.   MRN: [id number]  [employer] PROGRESS NOTE  Dominic [name] WUJ:[id number] DOB: 07-05-1946 DOA: 05/01/2016 PCP: No primary care provider on file.  HPI/Subjective: Patient more lethargic today. Patient woken up in the did answer some questions. Patient's caregiver mentioned that the patient does drink alcohol.  Objective: Vitals:   05/03/16 1319 05/03/16 1326  BP: (!) 84/48 96/66  Pulse: (!) 47 72  Resp:    Temp:      Filed Weights   05/01/16 1451  Weight: 104.3 kg (230 lb)    ROS: Review of Systems  Unable to perform ROS: Acuity of condition   Exam: Physical Exam  HENT:  Nose: No mucosal edema.  Mouth/Throat: No oropharyngeal exudate or posterior oropharyngeal edema.  Eyes: Conjunctivae and lids are normal.  Left eye clouded over.  Neck: No JVD present. Carotid bruit is not present. No edema present. No thyroid mass and no thyromegaly present.  Cardiovascular: S1 normal and S2 normal.  Exam reveals no gallop.   No murmur heard. Pulses:      Dorsalis pedis pulses are 2+ on the right side, and 2+ on the left side.  Respiratory: No respiratory distress. He has decreased breath sounds in the right lower field and the left lower field. He has wheezes in the right middle field, the right lower field, the left middle field and the left lower field. He has no rhonchi. He has no rales.  GI: Soft. Bowel sounds are normal. There is no tenderness.  Musculoskeletal:       Right ankle: He exhibits no swelling.       Left ankle: He exhibits no swelling.  Lymphadenopathy:    He has no cervical adenopathy.  Neurological: He is alert. No cranial nerve deficit.  Skin: Skin is warm. No rash noted. Nails show no clubbing.  Psychiatric: He has a normal mood and affect.      Data Reviewed: Basic Metabolic Panel:  Recent Labs Lab 05/01/16 1454 05/01/16 2308 05/02/16 0519 05/03/16 0628  NA 119* 123* 124* 126*  K 4.6  3.8 4.0 4.1  CL 85* 89* 90* 97*  CO2 20* 23 23 19*  GLUCOSE 522* 322* 333* 179*  BUN 44* 52* 60* 76*  CREATININE 2.67* 3.09* 3.52* 3.96*  CALCIUM 8.9 8.6* 8.6* 8.2*   CBC:  Recent Labs Lab 05/01/16 1454 05/02/16 0519  WBC 23.7* 17.8*  HGB 11.1* 10.9*  HCT 34.1* 33.0*  MCV 73.0* 71.0*  PLT 82* 79*   Cardiac Enzymes:  Recent Labs Lab 05/01/16 1454  CKTOTAL 701*   CBG:  Recent Labs Lab 05/02/16 1053 05/02/16 1627 05/02/16 2109 05/03/16 0719 05/03/16 1127  GLUCAP 317* 251* 217* 184* 189*    Recent Results (from the past 240 hour(s))  CULTURE, BLOOD (ROUTINE X 2) w Reflex to ID Panel     Status: Abnormal (Preliminary result)   Collection Time: 05/01/16  4:24 PM  Result Value Ref Range Status   Specimen Description BLOOD RIGHT ANTECUBITAL  Final   Special Requests   Final    BOTTLES DRAWN AEROBIC AND ANAEROBIC  AER 6 ML ANA 3 ML   Culture  Setup Time   Final    GRAM NEGATIVE RODS IN BOTH AEROBIC AND ANAEROBIC BOTTLES CRITICAL RESULT CALLED TO, READ BACK BY AND VERIFIED WITH: CHRISTINE KATSOUDAS ON 05/02/16 AT 0744 BY QSD Performed at [facility]    Culture KLEBSIELLA PNEUMONIAE (A)  Final   Report Status PENDING  Incomplete  CULTURE, BLOOD (ROUTINE X 2) w Reflex to ID Panel     Status: Abnormal (Preliminary result)   Collection Time: 05/01/16  4:24 PM  Result Value Ref Range Status   Specimen Description BLOOD LEFT ANTECUBITAL  Final   Special Requests   Final    BOTTLES DRAWN AEROBIC AND ANAEROBIC  AER 6 ML ANA 6 ML   Culture  Setup Time   Final    GRAM NEGATIVE RODS IN BOTH AEROBIC AND ANAEROBIC BOTTLES CRITICAL VALUE NOTED.  VALUE IS CONSISTENT WITH PREVIOUSLY REPORTED AND CALLED VALUE. Performed at [facility]    Culture KLEBSIELLA PNEUMONIAE (A)  Final   Report Status PENDING  Incomplete  Blood Culture ID Panel (Reflexed)     Status: Abnormal   Collection Time: 05/01/16  4:24 PM  Result Value Ref Range Status   Enterococcus species  NOT DETECTED NOT DETECTED Final   Listeria monocytogenes NOT DETECTED NOT DETECTED Final   Staphylococcus species NOT DETECTED NOT DETECTED Final   Staphylococcus aureus NOT DETECTED NOT DETECTED Final   Streptococcus species NOT DETECTED NOT DETECTED Final   Streptococcus agalactiae NOT DETECTED NOT DETECTED Final   Streptococcus pneumoniae NOT DETECTED NOT DETECTED Final   Streptococcus pyogenes NOT DETECTED NOT DETECTED Final   Acinetobacter baumannii NOT DETECTED NOT DETECTED Final   Enterobacteriaceae species DETECTED (A) NOT DETECTED Final    Comment: CRITICAL RESULT CALLED TO, READ BACK BY AND VERIFIED WITH: CHRISTINE KATSOUDAS ON 05/02/16 AT 0744 QSD    Enterobacter cloacae complex NOT DETECTED NOT DETECTED Final   Escherichia coli NOT DETECTED NOT DETECTED Final   Klebsiella oxytoca NOT DETECTED NOT DETECTED Final   Klebsiella pneumoniae DETECTED (A) NOT DETECTED Final    Comment: CRITICAL RESULT CALLED TO, READ BACK BY AND VERIFIED WITH: CHRISTINE KATSOUDAS ON 05/02/16 AT 0744 QSD    Proteus species NOT DETECTED NOT DETECTED Final   Serratia marcescens NOT DETECTED NOT DETECTED Final   Carbapenem resistance NOT DETECTED NOT DETECTED Final   Haemophilus influenzae NOT DETECTED NOT DETECTED Final   Neisseria meningitidis NOT DETECTED NOT DETECTED Final   Pseudomonas aeruginosa NOT DETECTED NOT DETECTED Final   Candida albicans NOT DETECTED NOT DETECTED Final   Candida glabrata NOT DETECTED NOT DETECTED Final   Candida krusei NOT DETECTED NOT DETECTED Final   Candida parapsilosis NOT DETECTED NOT DETECTED Final   Candida tropicalis NOT DETECTED NOT DETECTED Final  Culture, expectorated sputum-assessment     Status: None   Collection Time: 05/02/16  1:30 PM  Result Value Ref Range Status   Specimen Description EXPECTORATED SPUTUM  Final   Special Requests Normal  Final   Sputum evaluation THIS SPECIMEN IS ACCEPTABLE FOR SPUTUM CULTURE  Final   Report Status 05/02/2016 FINAL   Final  Culture, respiratory (NON-Expectorated)     Status: None (Preliminary result)   Collection Time: 05/02/16  1:30 PM  Result Value Ref Range Status   Specimen Description EXPECTORATED SPUTUM  Final   Special Requests Normal Reflexed from [id number]  Final   Gram Stain   Final    MODERATE WBC PRESENT, PREDOMINANTLY PMN MODERATE SQUAMOUS EPITHELIAL CELLS PRESENT ABUNDANT GRAM NEGATIVE RODS FEW GRAM POSITIVE COCCI FEW GRAM POSITIVE RODS    Culture   Final    CULTURE REINCUBATED FOR BETTER GROWTH Performed at [facility]    Report Status PENDING  Incomplete     Studies: Dg Chest 1 View  Result  Date: 05/03/2016 CLINICAL DATA:  Cough and shortness of breath. EXAM: CHEST 1 VIEW COMPARISON:  05/01/2016 FINDINGS: The heart is enlarged but stable. Stable tortuosity, ectasia and calcification of the thoracic aorta. The tracheostomy tube is in good position. No complicating features. No definite infiltrates or large effusions. Streaky bibasilar atelectasis. IMPRESSION: Stable tracheostomy tube. Stable cardiac enlargement. Streaky bibasilar atelectasis but no definite infiltrates or effusions. Electronically Signed   By: Rudie Meyer M.D.   On: 05/03/2016 08:05   US Renal  Result Date: 05/02/2016 CLINICAL DATA:  Acute renal failure. History of hypertension, diabetes, and atrial fibrillation. EXAM: RENAL / URINARY TRACT ULTRASOUND COMPLETE COMPARISON:  KUB of November 20, 2012 FINDINGS: Right Kidney: Length: 12.0 cm. The renal cortical echotexture remains lower than that of the adjacent liver. There is no focal mass or hydronephrosis. No stones are observed. Left Kidney: Length: 10.6 cm. The renal cortical echotexture is similar to that on the right. There is no focal mass or hydronephrosis. No stones are observed. Bladder: The urinary bladder is only partially distended. IMPRESSION: Limited visualization of the urinary bladder due to underdistention. Normal appearance of the kidneys with no  evidence of obstruction. Electronically Signed   By: David  [place] M.D.   On: 05/02/2016 09:59   Dg Chest Port 1 View  Result Date: 05/01/2016 CLINICAL DATA:  Hypoxia. Not on any oxygen at home, feeling weak for 3 weeks. Chills, weakness, nausea, vomiting and diarrhea. Cough and shortness of breath. EXAM: PORTABLE CHEST 1 VIEW COMPARISON:  05/01/2016 FINDINGS: There is a tracheostomy tube with the tip 4.6 cm above the carina. There is no focal parenchymal opacity. There is no pleural effusion or pneumothorax. The heart and mediastinal contours are stable. There is thoracic aortic atherosclerosis. The osseous structures are unremarkable. IMPRESSION: Tracheostomy tube with the tip 4.6 cm above the carina. No active cardiopulmonary disease. Aortic Atherosclerosis (ICD10-170.0) Electronically Signed   By: Elige Ko   On: 05/01/2016 16:37    Scheduled Meds: . aspirin EC  81 mg Oral Daily  . brimonidine  1 drop Both Eyes Q12H   And  . timolol  1 drop Both Eyes Q12H  . budesonide (PULMICORT) nebulizer solution  0.25 mg Nebulization BID  . cefTRIAXone (ROCEPHIN)  IV  2 g Intravenous Q24H  . fluticasone  1 spray Each Nare Daily  . folic acid  1 mg Oral Daily  . heparin  5,000 Units Subcutaneous Q8H  . insulin aspart  0-5 Units Subcutaneous QHS  . insulin aspart  0-9 Units Subcutaneous TID WC  . insulin aspart  5 Units Subcutaneous TID WC  . insulin glargine  26 Units Subcutaneous QHS  . ipratropium-albuterol  3 mL Nebulization Q6H  . loratadine  10 mg Oral Daily  . multivitamin with minerals  1 tablet Oral Daily  . sodium chloride  1,000 mL Intravenous Once  . sodium chloride flush  3 mL Intravenous Q12H  . thiamine  100 mg Oral Daily   Or  . thiamine  100 mg Intravenous Daily    Assessment/Plan:  1. Sepsis with Klebsiella pneumonia. On Rocephin 2 g IV every 24 hours. Sputum culture and urine culture still pending. Sensitivities on the blood culture still pending area. 2. Hypotension.  Patient receiving a second fluid bolus today and continuous IV fluids ordered. 3. Acute kidney injury and hyponatremia. Creatinine slightly worse today than yesterday. Nephrology following. May end up needing dialysis if no further improvement. 4.  Thrombocytopenia. Looking back at old labs this  is chronic. Could be secondary to alcohol. 5.   Uncontrolled type 2 diabetes mellitus on Lantus insulin and sliding scale. 6.   Mild rhabdomyolysis stop simvastatin 7.   Hyperlipidemia unspecified stop simvastatin. 8.   Weakness physical therapy evaluation 9.   Alcohol abuse we'll put ciwa protocol.  Code Status:     Code Status Orders        Start     Ordered   05/01/16 1757  Full code  Continuous     05/01/16 1756    Code Status History    Date Active Date Inactive Code Status Order ID Comments User Context   This patient has a current code status but no historical code status.     Disposition Plan: To be determined  Consultants:  Nephrology  Antibiotics:  Rocephin  Time spent: 26 minutes. Spoke with caregiver that comes into the house.  Alford Highland  [employer]

## 2016-05-03 NOTE — Progress Notes (Signed)
MD Wieting was made aware of bp 89/58 after 2 L bolus. Order was to tx to stepdown, dopamine gtt, and consult for critical care. No further orders at this time.

## 2016-05-03 NOTE — Progress Notes (Signed)
Subjective:  Serum creatinine is worse today Patient appears uncomfortable Blood pressure low normal Respiratory culture growing abundant gram-negative rods blood culture also showing gram-negative rods- Klebsiella pneumoniae    Objective:  Vital signs in last 24 hours:  Temp:  [97.8 F (36.6 C)-98 F (36.7 C)] 98 F (36.7 C) (09/02 1138) Pulse Rate:  [66-105] 89 (09/02 1151) Resp:  [18-20] 20 (09/02 1138) BP: (87-116)/(55-76) 92/76 (09/02 1151) SpO2:  [91 %-98 %] 93 % (09/02 1138)  Weight change:  Filed Weights   05/01/16 1451  Weight: 104.3 kg (230 lb)    Intake/Output:    Intake/Output Summary (Last 24 hours) at 05/03/16 1241 Last data filed at 05/03/16 0650  Gross per 24 hour  Intake             1200 ml  Output              150 ml  Net             1050 ml     Physical Exam: General: No acute distress, appears uncomfortable, laying in bed  HEENT Left corneal opacity  Neck Tracheostomy in place with drainage  Pulm/lungs Coarse breath sounds bilaterally, somewhat tachypneic  CVS/Heart Tachycardic, no rub or gallop  Abdomen:  Soft, mildly distended, nontender  Extremities: No peripheral edema  Neurologic: Alert, able to speak by covering his trach  Skin: No acute rashes          Basic Metabolic Panel:   Recent Labs Lab 05/01/16 1454 05/01/16 2308 05/02/16 0519 05/03/16 0628  NA 119* 123* 124* 126*  K 4.6 3.8 4.0 4.1  CL 85* 89* 90* 97*  CO2 20* 23 23 19*  GLUCOSE 522* 322* 333* 179*  BUN 44* 52* 60* 76*  CREATININE 2.67* 3.09* 3.52* 3.96*  CALCIUM 8.9 8.6* 8.6* 8.2*     CBC:  Recent Labs Lab 05/01/16 1454 05/02/16 0519  WBC 23.7* 17.8*  HGB 11.1* 10.9*  HCT 34.1* 33.0*  MCV 73.0* 71.0*  PLT 82* 79*      Microbiology:  Recent Results (from the past 720 hour(s))  CULTURE, BLOOD (ROUTINE X 2) w Reflex to ID Panel     Status: Abnormal (Preliminary result)   Collection Time: 05/01/16  4:24 PM  Result Value Ref Range Status    Specimen Description BLOOD RIGHT ANTECUBITAL  Final   Special Requests   Final    BOTTLES DRAWN AEROBIC AND ANAEROBIC  AER 6 ML ANA 3 ML   Culture  Setup Time   Final    GRAM NEGATIVE RODS IN BOTH AEROBIC AND ANAEROBIC BOTTLES CRITICAL RESULT CALLED TO, READ BACK BY AND VERIFIED WITH: CHRISTINE KATSOUDAS ON 05/02/16 AT 0744 BY QSD Performed at [facility]    Culture KLEBSIELLA PNEUMONIAE (A)  Final   Report Status PENDING  Incomplete  CULTURE, BLOOD (ROUTINE X 2) w Reflex to ID Panel     Status: Abnormal (Preliminary result)   Collection Time: 05/01/16  4:24 PM  Result Value Ref Range Status   Specimen Description BLOOD LEFT ANTECUBITAL  Final   Special Requests   Final    BOTTLES DRAWN AEROBIC AND ANAEROBIC  AER 6 ML ANA 6 ML   Culture  Setup Time   Final    GRAM NEGATIVE RODS IN BOTH AEROBIC AND ANAEROBIC BOTTLES CRITICAL VALUE NOTED.  VALUE IS CONSISTENT WITH PREVIOUSLY REPORTED AND CALLED VALUE. Performed at [facility]    Culture KLEBSIELLA PNEUMONIAE (A)  Final  Report Status PENDING  Incomplete  Blood Culture ID Panel (Reflexed)     Status: Abnormal   Collection Time: 05/01/16  4:24 PM  Result Value Ref Range Status   Enterococcus species NOT DETECTED NOT DETECTED Final   Listeria monocytogenes NOT DETECTED NOT DETECTED Final   Staphylococcus species NOT DETECTED NOT DETECTED Final   Staphylococcus aureus NOT DETECTED NOT DETECTED Final   Streptococcus species NOT DETECTED NOT DETECTED Final   Streptococcus agalactiae NOT DETECTED NOT DETECTED Final   Streptococcus pneumoniae NOT DETECTED NOT DETECTED Final   Streptococcus pyogenes NOT DETECTED NOT DETECTED Final   Acinetobacter baumannii NOT DETECTED NOT DETECTED Final   Enterobacteriaceae species DETECTED (A) NOT DETECTED Final    Comment: CRITICAL RESULT CALLED TO, READ BACK BY AND VERIFIED WITH: CHRISTINE KATSOUDAS ON 05/02/16 AT 0744 QSD    Enterobacter cloacae complex NOT DETECTED NOT DETECTED Final    Escherichia coli NOT DETECTED NOT DETECTED Final   Klebsiella oxytoca NOT DETECTED NOT DETECTED Final   Klebsiella pneumoniae DETECTED (A) NOT DETECTED Final    Comment: CRITICAL RESULT CALLED TO, READ BACK BY AND VERIFIED WITH: CHRISTINE KATSOUDAS ON 05/02/16 AT 0744 QSD    Proteus species NOT DETECTED NOT DETECTED Final   Serratia marcescens NOT DETECTED NOT DETECTED Final   Carbapenem resistance NOT DETECTED NOT DETECTED Final   Haemophilus influenzae NOT DETECTED NOT DETECTED Final   Neisseria meningitidis NOT DETECTED NOT DETECTED Final   Pseudomonas aeruginosa NOT DETECTED NOT DETECTED Final   Candida albicans NOT DETECTED NOT DETECTED Final   Candida glabrata NOT DETECTED NOT DETECTED Final   Candida krusei NOT DETECTED NOT DETECTED Final   Candida parapsilosis NOT DETECTED NOT DETECTED Final   Candida tropicalis NOT DETECTED NOT DETECTED Final  Culture, expectorated sputum-assessment     Status: None   Collection Time: 05/02/16  1:30 PM  Result Value Ref Range Status   Specimen Description EXPECTORATED SPUTUM  Final   Special Requests Normal  Final   Sputum evaluation THIS SPECIMEN IS ACCEPTABLE FOR SPUTUM CULTURE  Final   Report Status 05/02/2016 FINAL  Final  Culture, respiratory (NON-Expectorated)     Status: None (Preliminary result)   Collection Time: 05/02/16  1:30 PM  Result Value Ref Range Status   Specimen Description EXPECTORATED SPUTUM  Final   Special Requests Normal Reflexed from [id number]  Final   Gram Stain   Final    MODERATE WBC PRESENT, PREDOMINANTLY PMN MODERATE SQUAMOUS EPITHELIAL CELLS PRESENT ABUNDANT GRAM NEGATIVE RODS FEW GRAM POSITIVE COCCI FEW GRAM POSITIVE RODS    Culture   Final    CULTURE REINCUBATED FOR BETTER GROWTH Performed at [facility]    Report Status PENDING  Incomplete    Coagulation Studies: No results for input(s): LABPROT, INR in the last 72 hours.  Urinalysis:  Recent Labs  05/01/16 1312  COLORURINE RED*   LABSPEC 1.021  PHURINE 5.0  GLUCOSEU 150*  HGBUR 2+*  BILIRUBINUR 2+*  KETONESUR NEGATIVE  PROTEINUR 100*  NITRITE NEGATIVE  LEUKOCYTESUR 2+*      Imaging: Dg Chest 1 View  Result Date: 05/03/2016 CLINICAL DATA:  Cough and shortness of breath. EXAM: CHEST 1 VIEW COMPARISON:  05/01/2016 FINDINGS: The heart is enlarged but stable. Stable tortuosity, ectasia and calcification of the thoracic aorta. The tracheostomy tube is in good position. No complicating features. No definite infiltrates or large effusions. Streaky bibasilar atelectasis. IMPRESSION: Stable tracheostomy tube. Stable cardiac enlargement. Streaky bibasilar atelectasis but no definite infiltrates or  effusions. Electronically Signed   By: Rudie MeyerP.  Gallerani M.D.   On: 05/03/2016 08:05   [place] Renal  Result Date: 05/02/2016 CLINICAL DATA:  Acute renal failure. History of hypertension, diabetes, and atrial fibrillation. EXAM: RENAL / URINARY TRACT ULTRASOUND COMPLETE COMPARISON:  KUB of November 20, 2012 FINDINGS: Right Kidney: Length: 12.0 cm. The renal cortical echotexture remains lower than that of the adjacent liver. There is no focal mass or hydronephrosis. No stones are observed. Left Kidney: Length: 10.6 cm. The renal cortical echotexture is similar to that on the right. There is no focal mass or hydronephrosis. No stones are observed. Bladder: The urinary bladder is only partially distended. IMPRESSION: Limited visualization of the urinary bladder due to underdistention. Normal appearance of the kidneys with no evidence of obstruction. Electronically Signed   By: David  [place]azil[place]Jordan M.D.   On: 05/02/2016 09:59   Dg Chest Port 1 View  Result Date: 05/01/2016 CLINICAL DATA:  Hypoxia. Not on any oxygen at home, feeling weak for 3 weeks. Chills, weakness, nausea, vomiting and diarrhea. Cough and shortness of breath. EXAM: PORTABLE CHEST 1 VIEW COMPARISON:  05/01/2016 FINDINGS: There is a tracheostomy tube with the tip 4.6 cm above the carina.  There is no focal parenchymal opacity. There is no pleural effusion or pneumothorax. The heart and mediastinal contours are stable. There is thoracic aortic atherosclerosis. The osseous structures are unremarkable. IMPRESSION: Tracheostomy tube with the tip 4.6 cm above the carina. No active cardiopulmonary disease. Aortic Atherosclerosis (ICD10-170.0) Electronically Signed   By: Elige KoHetal  Patel   On: 05/01/2016 16:37     Medications:     . aspirin EC  81 mg Oral Daily  . brimonidine  1 drop Both Eyes Q12H   And  . timolol  1 drop Both Eyes Q12H  . budesonide (PULMICORT) nebulizer solution  0.25 mg Nebulization BID  . cefTRIAXone (ROCEPHIN)  IV  2 g Intravenous Q24H  . fluticasone  1 spray Each Nare Daily  . folic acid  1 mg Oral Daily  . heparin  5,000 Units Subcutaneous Q8H  . insulin aspart  0-5 Units Subcutaneous QHS  . insulin aspart  0-9 Units Subcutaneous TID WC  . insulin aspart  5 Units Subcutaneous TID WC  . insulin glargine  26 Units Subcutaneous QHS  . ipratropium-albuterol  3 mL Nebulization Q6H  . loratadine  10 mg Oral Daily  . multivitamin with minerals  1 tablet Oral Daily  . sodium chloride  1,000 mL Intravenous Once  . sodium chloride flush  3 mL Intravenous Q12H  . thiamine  100 mg Oral Daily   Or  . thiamine  100 mg Intravenous Daily   acetaminophen **OR** acetaminophen, LORazepam **OR** LORazepam, ondansetron **OR** ondansetron (ZOFRAN) IV  Assessment/ Plan:  70 y.o.African American male with medical problems of Chronic respiratory failure with chronic  tracheostomy, legally blind in left eye due to glaucoma, hypertension, diabetes, who [facility] for evaluation of weakness, increased falls.   1.  Acute renal failure, likely ATN from concurrent sepsis/bacteremia U/a with TNTC RBC, WBC, protein, glucose, Billirubin Renal ultrasound is negative for obstruction/stone Baseline creatinine likely 0.89 from November 2014.  No other labs  available for review Serum creatinine continues to worsen as patient's blood pressure remains low No acute indication for dialysis at the moment but with worsening renal function, he may require it in near future.  2. Hyponatremia Again likely secondary to renal failure Sodium level is improving slowly  3.  Thrombocytopenia Workup is in progress Likely sepsis related  4.  Diabetes type 2 insulin-dependent Poor control Hb1C 10.2 %  5. Sepsis with Klebsiella Pneumonia - antibiotics as per internal medicine team - Currently getting Rocephin IV   LOS: 2 Maxwell Martorano 9/2/201712:41 PM

## 2016-05-03 NOTE — Consult Note (Signed)
PULMONARY / CRITICAL CARE MEDICINE   Name: Dominic [name] MRN: [id number] DOB: 1946-08-17    ADMISSION DATE:  05/01/2016   CONSULTATION DATE:05/03/2016    REFERRING MD: Dr. Alford Highlandichard [name]  CHIEF COMPLAINT: worsening lethargy and hypotension   HISTORY OF PRESENT ILLNESS:   Mr. Dominic [name] is a 70 y/o AA male with a PMH sleep apnea/chronic respiratory failure s/p chronic trach(not on supplemental O2) chronic afib (not on anticoagulants), legally blind in the left eye due to glaucoma, type 2 DM, and hypertension who initially presented with burning on urination, poor oral intake, weakness, fall(from couch to floor; down for 12 hrs before being found by home health aid), chills, nausea, vomiting, diarrhea, shortness of breath and cough. He was found to have a WBC of ~24k, hyperglycemic with a blood glucose level of ~500 and   He was admitted and was being treated for acute renal failure which was deemed to be secondary to volume depletion and rhabdomyolysis, hyponatremia, and sepsis of likely urinary source. He was strated on IV fluids and empiric rocephin. Earlier today he was started on dopamine for systolic blood pressure in the low 80s and also given fluid boluses.  Despite treatment, his creatinine continued to trend up and his blood pressure continued to be low hence he was transferred to the ICU and PCCM was consulted.  He continues to cough up copious amounts of secretions. His sputum cultures show abundant GPRs, GPCs and GNRs, moderate squamous cells and WBCs and his blood cultures are positive for enterobacteriaceae species and klebsiella pneumoniae (aerobic and anaerobic). Patient lives at home and has a home-based caregiver.  He denies pain, chest pain, nausea and vomiting. C/O thirst and requesting something to eat or drink  PAST MEDICAL HISTORY :  He  has a past medical history of A-fib (HCC); Chronic respiratory failure (HCC); Diabetes mellitus without complication (HCC); Glaucoma;  Hypertension; Legal blindness of left eye, as defined in [place] and Tobacco use.  PAST SURGICAL HISTORY: He  has a past surgical history that includes Tracheostomy and Abdominal surgery.  Allergies  Allergen Reactions  . Other     Oatmeal grits  . Penicillins Hives, Rash and Other (See Comments)    Has patient had a PCN reaction causing immediate rash, facial/tongue/throat swelling, SOB or lightheadedness with hypotension: possibly. Pt isn't sure Has patient had a PCN reaction causing severe rash involving mucus membranes or skin necrosis: No Has patient had a PCN reaction that required hospitalization No Has patient had a PCN reaction occurring within the last 10 years: No If all of the above answers are "NO", then may proceed with Cephalosporin use.     No current facility-administered medications on file prior to encounter.    Current Outpatient Prescriptions on File Prior to Encounter  Medication Sig  . albuterol (PROVENTIL HFA;VENTOLIN HFA) 108 (90 Base) MCG/ACT inhaler Inhale into the lungs every 6 (six) hours as needed for wheezing or shortness of breath.  Marland Kitchen. amLODipine (NORVASC) 10 MG tablet Take 1 tablet by mouth daily.   Marland Kitchen. aspirin 81 MG chewable tablet Take 81 mg by mouth daily.  . brimonidine-timolol (COMBIGAN) 0.2-0.5 % ophthalmic solution Place 1 drop into both eyes every 12 (twelve) hours.  . fluticasone (FLONASE) 50 MCG/ACT nasal spray Place 1 spray into both nostrils daily.   Marland Kitchen. loratadine (CLARITIN) 10 MG tablet Take 10 mg by mouth daily.  . simvastatin (ZOCOR) 20 MG tablet Take 20 mg by mouth daily.    FAMILY HISTORY:  His  reported the following about his mother: died from old age.   SOCIAL HISTORY: He  reports that he has quit smoking. He has quit using smokeless tobacco. He reports that he drinks alcohol. He reports that he does not use drugs.  REVIEW OF SYSTEMS:   Constitutional: Positive for fever and chills.  HENT: Negative for congestion and rhinorrhea;  +tracheostomy.  Eyes: Visually impaired.  Respiratory: Negative for shortness of breath and wheezing but positive for cough and sputum production.  Cardiovascular: Negative for chest pain and palpitations.  Gastrointestinal: Negative  for nausea , vomiting and abdominal pain and  Loose stools Genitourinary: Negative for dysuria and urgency.  Endocrine: Denies polyuria, polyphagia and heat intolerance Musculoskeletal: Negative for myalgias and arthralgias but positive for recent fall and generalized weakness.  Skin: Negative for pallor and wound.  Neurological: Negative for dizziness and headaches   SUBJECTIVE:   VITAL SIGNS: BP (!) 81/48   Pulse 92   Temp 97.6 F (36.4 C) (Oral)   Resp 19   Ht 5\' 11"  (1.803 m)   Wt 230 lb (104.3 kg)   SpO2 98%   BMI 32.08 kg/m   HEMODYNAMICS:    VENTILATOR SETTINGS:    INTAKE / OUTPUT: I/O last 3 completed shifts: In: 1623.3 [I.V.:1423.3; IV Piggyback:200] Out: 150 [Urine:150]  PHYSICAL EXAMINATION: General: chronically ill-looking, visible chills Neuro: AAO X3, follows commands, able to speak clearly with capped trach, diminished muscle strength in upper body and extremities HEENT: [place]/AT, neck is short, thick secretions from trach, oral mucosa dry. Cardiovascular:  Afib, S1/S2, No MRG Lungs: Bilateral lung sounds, +rhonchi, no wheezing Abdomen: Obese, normal bowel sounds, no palpable organomegaly Musculoskeletal:  +rom, no visible deformities Extremities: +2 pulses, no edema. Skin: Warm to touch  LABS:  BMET  Recent Labs Lab 05/01/16 2308 05/02/16 0519 05/03/16 0628  NA 123* 124* 126*  K 3.8 4.0 4.1  CL 89* 90* 97*  CO2 23 23 19*  BUN 52* 60* 76*  CREATININE 3.09* 3.52* 3.96*  GLUCOSE 322* 333* 179*    Electrolytes  Recent Labs Lab 05/01/16 2308 05/02/16 0519 05/03/16 0628 05/03/16 1607  CALCIUM 8.6* 8.6* 8.2*  --   MG  --   --   --  2.1  PHOS  --   --   --  2.4*    CBC  Recent Labs Lab  05/01/16 1454 05/02/16 0519  WBC 23.7* 17.8*  HGB 11.1* 10.9*  HCT 34.1* 33.0*  PLT 82* 79*    Coag's  Recent Labs Lab 05/03/16 1607  APTT 33  INR 1.02    Sepsis Markers  Recent Labs Lab 05/03/16 1607  LATICACIDVEN 1.4  PROCALCITON >154.00    ABG  Recent Labs Lab 05/03/16 1155  PHART 7.36  PCO2ART 30*  PO2ART 77*    Liver Enzymes  Recent Labs Lab 05/03/16 1607  AST 34  ALT 25  ALKPHOS 328*  BILITOT 8.3*  ALBUMIN 2.2*    Cardiac Enzymes  Recent Labs Lab 05/03/16 1607  TROPONINI 0.04*    Glucose  Recent Labs Lab 05/02/16 1627 05/02/16 2109 05/03/16 0719 05/03/16 1127 05/03/16 1633 05/03/16 2015  GLUCAP 251* 217* 184* 189* 124* 146*    Imaging Dg Chest 1 View  Result Date: 05/03/2016 CLINICAL DATA:  Cough and shortness of breath. EXAM: CHEST 1 VIEW COMPARISON:  05/01/2016 FINDINGS: The heart is enlarged but stable. Stable tortuosity, ectasia and calcification of the thoracic aorta. The tracheostomy tube is in good position. No complicating features. No  definite infiltrates or large effusions. Streaky bibasilar atelectasis. IMPRESSION: Stable tracheostomy tube. Stable cardiac enlargement. Streaky bibasilar atelectasis but no definite infiltrates or effusions. Electronically Signed   By: Rudie Meyer M.D.   On: 05/03/2016 08:05   STUDIES:  05/03/2016: 2-D echo  CULTURES: Recent Results (from the past 240 hour(s))  CULTURE, BLOOD (ROUTINE X 2) w Reflex to ID Panel     Status: Abnormal (Preliminary result)   Collection Time: 05/01/16  4:24 PM  Result Value Ref Range Status   Specimen Description BLOOD RIGHT ANTECUBITAL  Final   Special Requests   Final    BOTTLES DRAWN AEROBIC AND ANAEROBIC  AER 6 ML ANA 3 ML   Culture  Setup Time   Final    GRAM NEGATIVE RODS IN BOTH AEROBIC AND ANAEROBIC BOTTLES CRITICAL RESULT CALLED TO, READ BACK BY AND VERIFIED WITH: CHRISTINE KATSOUDAS ON 05/02/16 AT 0744 BY QSD Performed at [facility]     Culture KLEBSIELLA PNEUMONIAE (A)  Final   Report Status PENDING  Incomplete  CULTURE, BLOOD (ROUTINE X 2) w Reflex to ID Panel     Status: Abnormal (Preliminary result)   Collection Time: 05/01/16  4:24 PM  Result Value Ref Range Status   Specimen Description BLOOD LEFT ANTECUBITAL  Final   Special Requests   Final    BOTTLES DRAWN AEROBIC AND ANAEROBIC  AER 6 ML ANA 6 ML   Culture  Setup Time   Final    GRAM NEGATIVE RODS IN BOTH AEROBIC AND ANAEROBIC BOTTLES CRITICAL VALUE NOTED.  VALUE IS CONSISTENT WITH PREVIOUSLY REPORTED AND CALLED VALUE. Performed at [facility]    Culture KLEBSIELLA PNEUMONIAE (A)  Final   Report Status PENDING  Incomplete  Blood Culture ID Panel (Reflexed)     Status: Abnormal   Collection Time: 05/01/16  4:24 PM  Result Value Ref Range Status   Enterococcus species NOT DETECTED NOT DETECTED Final   Listeria monocytogenes NOT DETECTED NOT DETECTED Final   Staphylococcus species NOT DETECTED NOT DETECTED Final   Staphylococcus aureus NOT DETECTED NOT DETECTED Final   Streptococcus species NOT DETECTED NOT DETECTED Final   Streptococcus agalactiae NOT DETECTED NOT DETECTED Final   Streptococcus pneumoniae NOT DETECTED NOT DETECTED Final   Streptococcus pyogenes NOT DETECTED NOT DETECTED Final   Acinetobacter baumannii NOT DETECTED NOT DETECTED Final   Enterobacteriaceae species DETECTED (A) NOT DETECTED Final    Comment: CRITICAL RESULT CALLED TO, READ BACK BY AND VERIFIED WITH: CHRISTINE KATSOUDAS ON 05/02/16 AT 0744 QSD    Enterobacter cloacae complex NOT DETECTED NOT DETECTED Final   Escherichia coli NOT DETECTED NOT DETECTED Final   Klebsiella oxytoca NOT DETECTED NOT DETECTED Final   Klebsiella pneumoniae DETECTED (A) NOT DETECTED Final    Comment: CRITICAL RESULT CALLED TO, READ BACK BY AND VERIFIED WITH: CHRISTINE KATSOUDAS ON 05/02/16 AT 0744 QSD    Proteus species NOT DETECTED NOT DETECTED Final   Serratia marcescens NOT DETECTED NOT  DETECTED Final   Carbapenem resistance NOT DETECTED NOT DETECTED Final   Haemophilus influenzae NOT DETECTED NOT DETECTED Final   Neisseria meningitidis NOT DETECTED NOT DETECTED Final   Pseudomonas aeruginosa NOT DETECTED NOT DETECTED Final   Candida albicans NOT DETECTED NOT DETECTED Final   Candida glabrata NOT DETECTED NOT DETECTED Final   Candida krusei NOT DETECTED NOT DETECTED Final   Candida parapsilosis NOT DETECTED NOT DETECTED Final   Candida tropicalis NOT DETECTED NOT DETECTED Final  Culture, expectorated sputum-assessment  Status: None   Collection Time: 05/02/16  1:30 PM  Result Value Ref Range Status   Specimen Description EXPECTORATED SPUTUM  Final   Special Requests Normal  Final   Sputum evaluation THIS SPECIMEN IS ACCEPTABLE FOR SPUTUM CULTURE  Final   Report Status 05/02/2016 FINAL  Final  Culture, respiratory (NON-Expectorated)     Status: None (Preliminary result)   Collection Time: 05/02/16  1:30 PM  Result Value Ref Range Status   Specimen Description EXPECTORATED SPUTUM  Final   Special Requests Normal Reflexed from [id number]  Final   Gram Stain   Final    MODERATE WBC PRESENT, PREDOMINANTLY PMN MODERATE SQUAMOUS EPITHELIAL CELLS PRESENT ABUNDANT GRAM NEGATIVE RODS FEW GRAM POSITIVE COCCI FEW GRAM POSITIVE RODS    Culture   Final    CULTURE REINCUBATED FOR BETTER GROWTH Performed at [facility]    Report Status PENDING  Incomplete  MRSA PCR Screening     Status: None   Collection Time: 05/03/16  4:48 PM  Result Value Ref Range Status   MRSA by PCR NEGATIVE NEGATIVE Final    Comment:        The GeneXpert MRSA Assay (FDA approved for NASAL specimens only), is one component of a comprehensive MRSA colonization surveillance program. It is not intended to diagnose MRSA infection nor to guide or monitor treatment for MRSA infections.     ANTIBIOTICS: Ceftriaxone 05/01/2016>05/03/2016 Cefepime 05/03/2016> Vancomycin  05/03/2016>  SIGNIFICANT EVENTS: 08/31>admitted with hypotension, hyperglycemia, ARF and sepsis 09/02>Transferred to the ICU for worsening hypotension, bacteremia and worsening renal failure  LINES/TUBES: Trach-chronic PIVs  DISCUSSION: 70 YO AA male with a chronic trach presenting with septic shock, klebsiella pneumoniae bacteremia and worsening renal failure s/p fall at home.   ASSESSMENT / PLAN:  PULMONARY A: OSA/Chronic respiratory failure s/p tracheostomy-Sputum culture with GPRs, GNRs and abundant squamous cells; patient is able to cough up secretions; not requiring supplemental O2; CXR unremarkable P:   Trach care per protocol Nebulized steroids and bronchodilators  CARDIOVASCULAR A:  Septic shock-BP improved with IV fluids  H/O Hypertension H/O Afib-rate controlled, not on anticoagulants P:  Hold anticoagulation 2/2 thrombocytopenia Need for long-term anticoagulation needs to be addressed prior to discharge given his CHADS2 score of 2 Continue IV fluid resuscitation Low dose neo-synephrine to maintain MAP>65 if unresponsive to IV fluid boluses Hold all home antihypertensives Continue ASA 81 mg daily EKG-reviewed; no acute changes Cycle cardiac enzymes 2-D echo  Will hold-off on CVL given improved BP with IV fluids and bacteremia  RENAL A:   Acute renal failure-Creatinine trending up 2.67>3.96 Hyponatremia P:   Nephrology following; no dialysis for now Continue to trend creatinine Monitor and correct electrolyte imbalances Monitor Is/Os  GASTROINTESTINAL A:   Nausea/vomiting/diarreah-improved P:   Advance diet as tolerated Aspiration precautions 2/2 trach-patient states that he eats regular food at home Will monitor closely  HEMATOLOGIC A:   Thrombocytopenia Anemia of chronic disease P:  Trend CBC Transfuse if Hg<7 and platelets<10 SCDs for VTE prophylaxis  INFECTIOUS A:   Klebsiella pneumoniae bacteremia Abnormal sputum culture-CXR without  infiltrate P:   F/U Culture and sensitivity Broad spectrum abx as above  ENDOCRINE A:  Severe hyperglycemia Uncontrolled T2DM P:   ICU glycemic control protocol Iv fluids  NEUROLOGIC A:   AMS 2/2 bacteremia and possible uremia from worsening kidney function-Improved with hydration and abx P:   RASS goal: n/a Monitor neuro status closely Continue abx and iv fluids as above  Psyche A: H/O ETOH  abuse-per patient's family P: CIWA protocol Thiamine, folic acid and MOV daily  Disposition and family update: ICU status. Patient, sister and brother updated on current status   Magdalene S. [facility] ANP-BC Pulmonary and Critical Care Medicine [facility] Pager [phone] or [phone] 05/03/2016, 8:28 PM   Billy Fischer, MD PCCM service Mobile [phone] Pager [phone] 05/04/2016

## 2016-05-03 NOTE — Progress Notes (Signed)
RT suctioned trach after neb tx's given.  Patient encouraged to cough before and after suctioning.  Patient swinging arms at RT during suctioning and wouldn't allow RT to make second pass.  Patient O2 sat remained 94% throughout suctioning, will continue to monitor.

## 2016-05-03 NOTE — Progress Notes (Signed)
Pt refused to let RN suction. Pt lethargic with low Bp MD Hilton SinclairWeiting was notified. Also a 5 beat run of vtach. 1 L bolus and ABG was ordered.

## 2016-05-03 NOTE — Progress Notes (Signed)
Trach safety kit at the bedside

## 2016-05-03 NOTE — Progress Notes (Signed)
High fall risk. Room air. A fib. CIWA was negative. FS are stable. Pt is very sleepy and bp is soft. Pt reports no pain. Takes meds ok. A & O. Urinal/ bedpan. Pt has no further concerns. Caregiver was updated on plan of care.

## 2016-05-03 NOTE — Progress Notes (Addendum)
eLink Physician-Brief Progress Note Patient Name: Dominic ChamberJohn H [name] DOB: Mar 22, 1946 MRN: [id number]   Date of Service  05/03/2016  HPI/Events of Note  Notified by hospitalist of transfer pending from floor. Patient currently with acute renal failure, sepsis, & bacteremia. Reportedly has chronic tracheostomy but unable to discern whether or not tracheostomy is cuffed or uncuffed. Admitted on 8/31 with sepsis secondary to presumed urinary tract infection although UA has significant squamous epithelial cells and I question whether or not the specimen is accurate. Urine culture has remained negative to date. Patient does have expectorated sputum specimen with abundant gram-negative rods, gram-positive cocci, and gram-positive rods but with moderate squamous epithelial cells as well. Blood cultures have shown Klebsiella pneumoniae 2 out of 2 bottles positive. Blood PCR also showed Enterobacteriaceae species. Patient's renal failure continuing to worsen. Has received 2 L IV fluid today. Has been on empiric Rocephin since admission with a penicillin allergy. Reportedly respiratory status has remained stable. No focal opacity on x-ray imaging.  Hospitalist ordered Dopamine infusion. Patient only has 1 PIV.  eICU Interventions  1. Switching to low dose Neo-Synephrine from Dopamine to maintain MAP >65 & SBP >90 2. Continuing with an additional 2L NS bolus 3. Ordering 2nd PIV placement for access 4. Checking Troponin I & EKG now. Trending Troponin I q6hr 5. Checking TTE 6. Checking Serum lactic acid now & trending q6hr 7. Switching from Rocephin to Vancomycin & Cefepime given worsening sepsis. 8. Holding off on repeat cultures at this time unless he becomes febrile. 9. Checking Serum CBC, Hepatic Function Panel, Coags, Phosphorus, & Magnesium.  10. Patient made NPO 11. Continuing CIWA Protocol 12. Continuing IV FA & Thiamine 13. PCCM to see patient in consult     Intervention Category Evaluation Type: New  Patient Evaluation  Dominic CousinsJennings Dainel [name] 05/03/2016, 4:12 PM

## 2016-05-03 NOTE — Progress Notes (Signed)
RT to room to obtain ABG on room air.  Patient tolerated well, then allowed RT to suction trach, one pass only.  Will continue to monitor.

## 2016-05-03 NOTE — Progress Notes (Signed)
PT Cancellation Note  Patient Details Name: Dominic [name] MRN: [id number] DOB: 1946/07/14   Cancelled Treatment:    Reason Eval/Treat Not Completed: Fatigue/lethargy limiting ability to participate. Evaluation was attempted, patient repeatedly feel asleep, and therapist was unable to continue the evaluation.   Ezekiel InaKristine S Khadar Monger, PT, DPT [place], Barkley BrunsKristine S 05/03/2016, 1:46 PM

## 2016-05-04 ENCOUNTER — Inpatient Hospital Stay (HOSPITAL_COMMUNITY)
Admit: 2016-05-04 | Discharge: 2016-05-04 | Disposition: A | Discharge status: Home / Self Care | Payer: Medicare Other | Attending: Pulmonary Disease | Admitting: Pulmonary Disease

## 2016-05-04 DIAGNOSIS — R6521 Severe sepsis with septic shock: Secondary | ICD-10-CM

## 2016-05-04 DIAGNOSIS — B961 Klebsiella pneumoniae [K. pneumoniae] as the cause of diseases classified elsewhere: Secondary | ICD-10-CM

## 2016-05-04 DIAGNOSIS — R7881 Bacteremia: Secondary | ICD-10-CM

## 2016-05-04 DIAGNOSIS — A419 Sepsis, unspecified organism: Secondary | ICD-10-CM

## 2016-05-04 DIAGNOSIS — N390 Urinary tract infection, site not specified: Secondary | ICD-10-CM

## 2016-05-04 DIAGNOSIS — N179 Acute kidney failure, unspecified: Secondary | ICD-10-CM

## 2016-05-04 LAB — RENAL FUNCTION PANEL
Albumin: 1.9 g/dL — ABNORMAL LOW (ref 3.5–5.0)
Anion gap: 10 (ref 5–15)
BUN: 83 mg/dL — ABNORMAL HIGH (ref 6–20)
CO2: 18 mmol/L — ABNORMAL LOW (ref 22–32)
Calcium: 7.8 mg/dL — ABNORMAL LOW (ref 8.9–10.3)
Chloride: 101 mmol/L (ref 101–111)
Creatinine, Ser: 4.21 mg/dL — ABNORMAL HIGH (ref 0.61–1.24)
GFR calc Af Amer: 15 mL/min — ABNORMAL LOW (ref >60)
GFR calc non Af Amer: 13 mL/min — ABNORMAL LOW (ref >60)
Glucose, Bld: 134 mg/dL — ABNORMAL HIGH (ref 65–99)
Phosphorus: 2.6 mg/dL (ref 2.5–4.6)
Potassium: 3.9 mmol/L (ref 3.5–5.1)
Sodium: 129 mmol/L — ABNORMAL LOW (ref 135–145)

## 2016-05-04 LAB — GLUCOSE, CAPILLARY
GLUCOSE-CAPILLARY: 129 mg/dL — AB (ref 65–99)
Glucose-Capillary: 124 mg/dL — ABNORMAL HIGH (ref 65–99)
Glucose-Capillary: 126 mg/dL — ABNORMAL HIGH (ref 65–99)
Glucose-Capillary: 129 mg/dL — ABNORMAL HIGH (ref 65–99)
Glucose-Capillary: 135 mg/dL — ABNORMAL HIGH (ref 65–99)
Glucose-Capillary: 135 mg/dL — ABNORMAL HIGH (ref 65–99)

## 2016-05-04 LAB — CULTURE, BLOOD (ROUTINE X 2)

## 2016-05-04 LAB — LACTIC ACID, PLASMA: LACTIC ACID, VENOUS: 1.1 mmol/L (ref 0.5–1.9)

## 2016-05-04 LAB — CBC
HCT: 31.8 % — ABNORMAL LOW (ref 40.0–52.0)
Hemoglobin: 10.5 g/dL — ABNORMAL LOW (ref 13.0–18.0)
MCH: 24 pg — AB (ref 26.0–34.0)
MCHC: 32.9 g/dL (ref 32.0–36.0)
MCV: 73 fL — ABNORMAL LOW (ref 80.0–100.0)
PLATELETS: 149 10*3/uL — AB (ref 150–440)
RBC: 4.35 MIL/uL — AB (ref 4.40–5.90)
RDW: 14.3 % (ref 11.5–14.5)
WBC: 11.2 10*3/uL — ABNORMAL HIGH (ref 3.8–10.6)

## 2016-05-04 LAB — URINE CULTURE: CULTURE: NO GROWTH

## 2016-05-04 LAB — CK: Total CK: 66 U/L (ref 49–397)

## 2016-05-04 LAB — CULTURE, RESPIRATORY

## 2016-05-04 LAB — CULTURE, RESPIRATORY W GRAM STAIN: Special Requests: NORMAL

## 2016-05-04 LAB — ECHOCARDIOGRAM COMPLETE
Height: 71 in
Weight: 3680 oz

## 2016-05-04 LAB — MAGNESIUM: Magnesium: 2 mg/dL (ref 1.7–2.4)

## 2016-05-04 LAB — PROCALCITONIN: Procalcitonin: 141.62 ng/mL

## 2016-05-04 LAB — PHOSPHORUS: Phosphorus: 2.7 mg/dL (ref 2.5–4.6)

## 2016-05-04 LAB — TROPONIN I: Troponin I: 0.04 ng/mL (ref <0.03)

## 2016-05-04 MED ORDER — INSULIN ASPART 100 UNIT/ML ~~LOC~~ SOLN
2.0000 [IU] | SUBCUTANEOUS | Status: DC
  Administered 2016-05-04 (×2): 2 [IU] via SUBCUTANEOUS
  Filled 2016-05-04 (×2): qty 2

## 2016-05-04 MED ORDER — INSULIN ASPART 100 UNIT/ML ~~LOC~~ SOLN
0.0000 [IU] | Freq: Three times a day (TID) | SUBCUTANEOUS | Status: DC
  Administered 2016-05-04 – 2016-05-09 (×7): 2 [IU] via SUBCUTANEOUS
  Administered 2016-05-10 (×2): 3 [IU] via SUBCUTANEOUS
  Administered 2016-05-11: 5 [IU] via SUBCUTANEOUS
  Administered 2016-05-11: 3 [IU] via SUBCUTANEOUS
  Administered 2016-05-12 (×2): 2 [IU] via SUBCUTANEOUS
  Filled 2016-05-04 (×2): qty 3
  Filled 2016-05-04 (×3): qty 2
  Filled 2016-05-04: qty 5
  Filled 2016-05-04 (×3): qty 2
  Filled 2016-05-04: qty 3
  Filled 2016-05-04 (×3): qty 2

## 2016-05-04 MED ORDER — LACTULOSE 10 GM/15ML PO SOLN
30.0000 g | Freq: Once | ORAL | Status: AC
  Administered 2016-05-04: 30 g via ORAL
  Filled 2016-05-04: qty 60

## 2016-05-04 MED ORDER — INSULIN GLARGINE 100 UNIT/ML ~~LOC~~ SOLN
20.0000 [IU] | Freq: Every day | SUBCUTANEOUS | Status: DC
  Administered 2016-05-04 – 2016-05-07 (×4): 20 [IU] via SUBCUTANEOUS
  Filled 2016-05-04 (×6): qty 0.2

## 2016-05-04 MED ORDER — INSULIN ASPART 100 UNIT/ML ~~LOC~~ SOLN: 0.0000 [IU] | Freq: Every day | SUBCUTANEOUS | Status: DC

## 2016-05-04 MED ORDER — ENSURE ENLIVE PO LIQD: 237.0000 mL | Freq: Three times a day (TID) | ORAL | Status: DC

## 2016-05-04 MED ORDER — LORAZEPAM 2 MG/ML IJ SOLN: 0.5000 mg | INTRAMUSCULAR | Status: DC | PRN

## 2016-05-04 NOTE — Progress Notes (Signed)
PT Cancellation Note  Patient Details Name: Dominic ChamberJohn H [name] MRN: [id number] DOB: 11/26/1945   Cancelled Treatment:    Reason Eval/Treat Not Completed: Medical issues which prohibited therapy Pt with change in medical status with transfer to CCU.  PT orders will be completed, will need new orders when medically appropriate.  Malachi ProGalen R Dontrelle Mazon, DPT 05/04/2016, 8:20 AM

## 2016-05-04 NOTE — Progress Notes (Signed)
[employer] PROGRESS NOTE  Dominic [name] ZOX:[id number] DOB: 10-14-1945 DOA: 05/01/2016 PCP: No primary care provider on file.  HPI/Subjective: Patient states he is not short of breath. Does have some wheeze and cough. Yesterday evening needed to be sent to the ICU for hypotension.  Objective: Vitals:   05/04/16 0600 05/04/16 0700  BP: (!) 98/55 108/76  Pulse: 65 64  Resp: 19 (!) 21  Temp:      Filed Weights   05/01/16 1451  Weight: 104.3 kg (230 lb)    ROS: Review of Systems  Constitutional: Negative for chills and fever.  Eyes: Negative for blurred vision.  Respiratory: Positive for cough and wheezing. Negative for shortness of breath.   Cardiovascular: Negative for chest pain.  Gastrointestinal: Negative for abdominal pain, constipation, diarrhea, nausea and vomiting.  Genitourinary: Negative for dysuria.  Musculoskeletal: Negative for joint pain.  Neurological: Negative for dizziness and headaches.   Exam: Physical Exam  HENT:  Nose: No mucosal edema.  Mouth/Throat: No oropharyngeal exudate or posterior oropharyngeal edema.  Eyes: Conjunctivae and lids are normal.  Left eye clouded over.  Neck: No JVD present. Carotid bruit is not present. No edema present. No thyroid mass and no thyromegaly present.  Cardiovascular: S1 normal and S2 normal.  Exam reveals no gallop.   No murmur heard. Pulses:      Dorsalis pedis pulses are 2+ on the right side, and 2+ on the left side.  Respiratory: No respiratory distress. He has decreased breath sounds in the right lower field and the left lower field. He has wheezes in the right middle field, the right lower field, the left middle field and the left lower field. He has no rhonchi. He has no rales.  GI: Soft. Bowel sounds are normal. There is no tenderness.  Musculoskeletal:       Right ankle: He exhibits swelling.       Left ankle: He exhibits swelling.  Lymphadenopathy:    He has no cervical adenopathy.  Neurological: He  is alert. No cranial nerve deficit.  Skin: Skin is warm. No rash noted. Nails show no clubbing.  Psychiatric: He has a normal mood and affect.      Data Reviewed: Basic Metabolic Panel:  Recent Labs Lab 05/01/16 1454 05/01/16 2308 05/02/16 0519 05/03/16 0628 05/03/16 1607 05/04/16 0349  NA 119* 123* 124* 126*  --  129*  K 4.6 3.8 4.0 4.1  --  3.9  CL 85* 89* 90* 97*  --  101  CO2 20* 23 23 19*  --  18*  GLUCOSE 522* 322* 333* 179*  --  134*  BUN 44* 52* 60* 76*  --  83*  CREATININE 2.67* 3.09* 3.52* 3.96*  --  4.21*  CALCIUM 8.9 8.6* 8.6* 8.2*  --  7.8*  MG  --   --   --   --  2.1 2.0  PHOS  --   --   --   --  2.4* 2.6  2.7   Liver Function Tests:  Recent Labs Lab 05/03/16 1607 05/04/16 0349  AST 34  --   ALT 25  --   ALKPHOS 328*  --   BILITOT 8.3*  --   PROT 6.6  --   ALBUMIN 2.2* 1.9*   CBC:  Recent Labs Lab 05/01/16 1454 05/02/16 0519 05/04/16 0349  WBC 23.7* 17.8* 11.2*  HGB 11.1* 10.9* 10.5*  HCT 34.1* 33.0* 31.8*  MCV 73.0* 71.0* 73.0*  PLT 82* 79* 149*  Cardiac Enzymes:  Recent Labs Lab 05/01/16 1454 05/03/16 0628 05/03/16 1607 05/03/16 2128 05/04/16 0349  CKTOTAL 701* 96  --   --  66  TROPONINI  --   --  0.04* 0.06* 0.04*   CBG:  Recent Labs Lab 05/03/16 1633 05/03/16 2015 05/04/16 0001 05/04/16 0405 05/04/16 0709  GLUCAP 124* 146* 126* 129* 135*    Recent Results (from the past 240 hour(s))  CULTURE, BLOOD (ROUTINE X 2) w Reflex to ID Panel     Status: Abnormal (Preliminary result)   Collection Time: 05/01/16  4:24 PM  Result Value Ref Range Status   Specimen Description BLOOD RIGHT ANTECUBITAL  Final   Special Requests   Final    BOTTLES DRAWN AEROBIC AND ANAEROBIC  AER 6 ML ANA 3 ML   Culture  Setup Time   Final    GRAM NEGATIVE RODS IN BOTH AEROBIC AND ANAEROBIC BOTTLES CRITICAL RESULT CALLED TO, READ BACK BY AND VERIFIED WITH: CHRISTINE KATSOUDAS ON 05/02/16 AT 0744 BY QSD Performed at [facility]     Culture KLEBSIELLA PNEUMONIAE (A)  Final   Report Status PENDING  Incomplete  CULTURE, BLOOD (ROUTINE X 2) w Reflex to ID Panel     Status: Abnormal (Preliminary result)   Collection Time: 05/01/16  4:24 PM  Result Value Ref Range Status   Specimen Description BLOOD LEFT ANTECUBITAL  Final   Special Requests   Final    BOTTLES DRAWN AEROBIC AND ANAEROBIC  AER 6 ML ANA 6 ML   Culture  Setup Time   Final    GRAM NEGATIVE RODS IN BOTH AEROBIC AND ANAEROBIC BOTTLES CRITICAL VALUE NOTED.  VALUE IS CONSISTENT WITH PREVIOUSLY REPORTED AND CALLED VALUE. Performed at [facility]    Culture KLEBSIELLA PNEUMONIAE (A)  Final   Report Status PENDING  Incomplete  Blood Culture ID Panel (Reflexed)     Status: Abnormal   Collection Time: 05/01/16  4:24 PM  Result Value Ref Range Status   Enterococcus species NOT DETECTED NOT DETECTED Final   Listeria monocytogenes NOT DETECTED NOT DETECTED Final   Staphylococcus species NOT DETECTED NOT DETECTED Final   Staphylococcus aureus NOT DETECTED NOT DETECTED Final   Streptococcus species NOT DETECTED NOT DETECTED Final   Streptococcus agalactiae NOT DETECTED NOT DETECTED Final   Streptococcus pneumoniae NOT DETECTED NOT DETECTED Final   Streptococcus pyogenes NOT DETECTED NOT DETECTED Final   Acinetobacter baumannii NOT DETECTED NOT DETECTED Final   Enterobacteriaceae species DETECTED (A) NOT DETECTED Final    Comment: CRITICAL RESULT CALLED TO, READ BACK BY AND VERIFIED WITH: CHRISTINE KATSOUDAS ON 05/02/16 AT 0744 QSD    Enterobacter cloacae complex NOT DETECTED NOT DETECTED Final   Escherichia coli NOT DETECTED NOT DETECTED Final   Klebsiella oxytoca NOT DETECTED NOT DETECTED Final   Klebsiella pneumoniae DETECTED (A) NOT DETECTED Final    Comment: CRITICAL RESULT CALLED TO, READ BACK BY AND VERIFIED WITH: CHRISTINE KATSOUDAS ON 05/02/16 AT 0744 QSD    Proteus species NOT DETECTED NOT DETECTED Final   Serratia marcescens NOT DETECTED NOT  DETECTED Final   Carbapenem resistance NOT DETECTED NOT DETECTED Final   Haemophilus influenzae NOT DETECTED NOT DETECTED Final   Neisseria meningitidis NOT DETECTED NOT DETECTED Final   Pseudomonas aeruginosa NOT DETECTED NOT DETECTED Final   Candida albicans NOT DETECTED NOT DETECTED Final   Candida glabrata NOT DETECTED NOT DETECTED Final   Candida krusei NOT DETECTED NOT DETECTED Final   Candida parapsilosis NOT  DETECTED NOT DETECTED Final   Candida tropicalis NOT DETECTED NOT DETECTED Final  Culture, expectorated sputum-assessment     Status: None   Collection Time: 05/02/16  1:30 PM  Result Value Ref Range Status   Specimen Description EXPECTORATED SPUTUM  Final   Special Requests Normal  Final   Sputum evaluation THIS SPECIMEN IS ACCEPTABLE FOR SPUTUM CULTURE  Final   Report Status 05/02/2016 FINAL  Final  Culture, respiratory (NON-Expectorated)     Status: None (Preliminary result)   Collection Time: 05/02/16  1:30 PM  Result Value Ref Range Status   Specimen Description EXPECTORATED SPUTUM  Final   Special Requests Normal Reflexed from [id number]  Final   Gram Stain   Final    MODERATE WBC PRESENT, PREDOMINANTLY PMN MODERATE SQUAMOUS EPITHELIAL CELLS PRESENT ABUNDANT GRAM NEGATIVE RODS FEW GRAM POSITIVE COCCI FEW GRAM POSITIVE RODS    Culture   Final    CULTURE REINCUBATED FOR BETTER GROWTH [facility]    Report Status PENDING  Incomplete  MRSA PCR Screening     Status: None   Collection Time: 05/03/16  4:48 PM  Result Value Ref Range Status   MRSA by PCR NEGATIVE NEGATIVE Final    Comment:        The GeneXpert MRSA Assay (FDA approved for NASAL specimens only), is one component of a comprehensive MRSA colonization surveillance program. It is not intended to diagnose MRSA infection nor to guide or monitor treatment for MRSA infections.      Studies: Dg Chest 1 View  Result Date: 05/03/2016 CLINICAL DATA:  Cough and shortness of breath.  EXAM: CHEST 1 VIEW COMPARISON:  05/01/2016 FINDINGS: The heart is enlarged but stable. Stable tortuosity, ectasia and calcification of the thoracic aorta. The tracheostomy tube is in good position. No complicating features. No definite infiltrates or large effusions. Streaky bibasilar atelectasis. IMPRESSION: Stable tracheostomy tube. Stable cardiac enlargement. Streaky bibasilar atelectasis but no definite infiltrates or effusions. Electronically Signed   By: Rudie MeyerP.  Gallerani M.D.   On: 05/03/2016 08:05   [place] Renal  Result Date: 05/02/2016 CLINICAL DATA:  Acute renal failure. History of hypertension, diabetes, and atrial fibrillation. EXAM: RENAL / URINARY TRACT ULTRASOUND COMPLETE COMPARISON:  KUB of November 20, 2012 FINDINGS: Right Kidney: Length: 12.0 cm. The renal cortical echotexture remains lower than that of the adjacent liver. There is no focal mass or hydronephrosis. No stones are observed. Left Kidney: Length: 10.6 cm. The renal cortical echotexture is similar to that on the right. There is no focal mass or hydronephrosis. No stones are observed. Bladder: The urinary bladder is only partially distended. IMPRESSION: Limited visualization of the urinary bladder due to underdistention. Normal appearance of the kidneys with no evidence of obstruction. Electronically Signed   By: David  [place]azil[place] M.D.   On: 05/02/2016 09:59    Scheduled Meds: . aspirin EC  81 mg Oral Daily  . brimonidine  1 drop Both Eyes Q12H   And  . timolol  1 drop Both Eyes Q12H  . budesonide (PULMICORT) nebulizer solution  0.25 mg Nebulization BID  . ceFEPime (MAXIPIME) IV  2 g Intravenous Q24H  . feeding supplement (ENSURE ENLIVE)  237 mL Oral TID BM  . fluticasone  1 spray Each Nare Daily  . folic acid  1 mg Intravenous Daily  . heparin  5,000 Units Subcutaneous Q8H  . insulin aspart  2-6 Units Subcutaneous Q4H  . insulin glargine  26 Units Subcutaneous QHS  . ipratropium-albuterol  3 mL Nebulization Q6H  . sodium chloride  flush  3 mL Intravenous Q12H  . thiamine  100 mg Oral Daily   Or  . thiamine  100 mg Intravenous Daily  . vancomycin  1,000 mg Intravenous Q36H   Continuous Infusions: . sodium chloride 100 mL/hr at 05/04/16 0421  . phenylephrine (NEO-SYNEPHRINE) Adult infusion 30 mcg/min (05/04/16 1191)    Assessment/Plan:  1. Severe sepsis with multiorgan failure including shock and acute renal failure. Patient's antibiotic switched to Maxipime and vancomycin added. Still awaiting blood culture results for sensitivities on the Klebsiella growing in the blood. Urine culture still pending. Sputum culture with gram-negative rods. Patient on Neo-Synephrine to maintain blood pressure. 2. Shock on Neo-Synephrine 3. Acute kidney injury and hyponatremia. Creatinine worsening on a daily basis. Nephrology following. May end up needing dialysis if continues to worsen. 4. Chronic thrombocytopenia. Looking back at old labs this is likely secondary to alcohol. 5. Type 2 diabetes uncontrolled. On Lantus and sliding scale. 6. Mild rhabdomyolysis. Simvastatin stopped. CPK trended normal range. 7. Hyperlipidemia unspecified stop simvastatin 8. Weakness. 9. Alcohol abuse. CIWA protocol. 10. Chronic tracheostomy patient. Continue nebulizer treatments. Oxygenating well on room air  Code Status:     Code Status Orders        Start     Ordered   05/01/16 1757  Full code  Continuous     05/01/16 1756    Code Status History    Date Active Date Inactive Code Status Order ID Comments User Context   05/01/2016  5:56 PM 05/04/2016  5:57 AM Full Code [id number]  Enid Baas, MD Inpatient     Disposition Plan: To be determined  Consultants:  Nephrology  Critical care specialist   Antibiotics:  Maxipime  Vancomycin  Time spent: 32 minutes. Patient is critically ill and needs ICU monitoring  Alford Highland  [employer]

## 2016-05-04 NOTE — Progress Notes (Signed)
Central tele monitoring called to report pt converted to NSR about 1 hour ago. Currently in the 60s. No concerns.

## 2016-05-04 NOTE — Plan of Care (Signed)
Problem: Safety: Goal: Ability to remain free from injury will improve Outcome: Completed/Met Date Met: 05/04/16 Told patient to call us if he needs anything instead of getting out of bed for his safety  Problem: Skin Integrity: Goal: Risk for impaired skin integrity will decrease Outcome: Progressing With frequent turning and frequent assessing we are helping to optimize pt's skin integrity

## 2016-05-04 NOTE — Progress Notes (Signed)
PULMONARY / CRITICAL CARE MEDICINE   Name: Dominic ChamberJohn H [name] MRN: [id number] DOB: [birth year]    ADMISSION DATE:  05/01/2016  CONSULTATION DATE:  05/03/2016   REFERRING MD: Dr. Alford Highlandichard Wieting   PT PROFILE:   [age] M with sleep apnea/chronic respiratory failure, chronic trach, CAF, DM2, and hypertension who initially admitted 08/31 to hospitalist service with UTI and Klebsiella bacteremia. Also noted to have AKI and severe hyperglycemia. Transferred to ICU 09/02 with hypotension.and rising Cr. Vasopressors initiated.   SUBJECTIVE:  More awake and responsive. Offers no complaints. Drank some ensure. Continues to cough up copious secretions.   VITAL SIGNS: BP 108/76   Pulse 64   Temp 98.6 F (37 C) (Oral)   Resp (!) 21   Ht 5\' 11"  (1.803 m)   Wt 230 lb (104.3 kg)   SpO2 99%   BMI 32.08 kg/m   HEMODYNAMICS:    VENTILATOR SETTINGS:    INTAKE / OUTPUT: I/O last 3 completed shifts: In: 3292 [P.O.:30; I.V.:2812; IV Piggyback:450] Out: 100 [Urine:100]  PHYSICAL EXAMINATION: General: NAD, cognition intact Neuro: RASS 0, no focal deficits HEENT: [place]/AT, trahc site clean Cardiovascular:  IRIR, no M Lungs: +rhonchi, no wheezing Abdomen: Obese, normal bowel sounds Extremities: +2 pulses, no edema.  LABS:  BMET  Recent Labs Lab 05/02/16 0519 05/03/16 0628 05/04/16 0349  NA 124* 126* 129*  K 4.0 4.1 3.9  CL 90* 97* 101  CO2 23 19* 18*  BUN 60* 76* 83*  CREATININE 3.52* 3.96* 4.21*  GLUCOSE 333* 179* 134*    Electrolytes  Recent Labs Lab 05/02/16 0519 05/03/16 0628 05/03/16 1607 05/04/16 0349  CALCIUM 8.6* 8.2*  --  7.8*  MG  --   --  2.1 2.0  PHOS  --   --  2.4* 2.6  2.7    CBC  Recent Labs Lab 05/01/16 1454 05/02/16 0519 05/04/16 0349  WBC 23.7* 17.8* 11.2*  HGB 11.1* 10.9* 10.5*  HCT 34.1* 33.0* 31.8*  PLT 82* 79* 149*    Coag's  Recent Labs Lab 05/03/16 1607  APTT 33  INR 1.02    Sepsis Markers  Recent Labs Lab 05/03/16 1607  05/03/16 2128 05/04/16 0349  LATICACIDVEN 1.4 1.5 1.1  PROCALCITON >154.00  --  141.62    ABG  Recent Labs Lab 05/03/16 1155  PHART 7.36  PCO2ART 30*  PO2ART 77*    Liver Enzymes  Recent Labs Lab 05/03/16 1607 05/04/16 0349  AST 34  --   ALT 25  --   ALKPHOS 328*  --   BILITOT 8.3*  --   ALBUMIN 2.2* 1.9*    Cardiac Enzymes  Recent Labs Lab 05/03/16 1607 05/03/16 2128 05/04/16 0349  TROPONINI 0.04* 0.06* 0.04*    Glucose  Recent Labs Lab 05/03/16 1127 05/03/16 1633 05/03/16 2015 05/04/16 0001 05/04/16 0405 05/04/16 0709  GLUCAP 189* 124* 146* 126* 129* 135*    Imaging No results found. STUDIES:  05/03/2016: 2-D echo  CULTURES: Recent Results (from the past 240 hour(s))  CULTURE, BLOOD (ROUTINE X 2) w Reflex to ID Panel     Status: Abnormal (Preliminary result)   Collection Time: 05/01/16  4:24 PM  Result Value Ref Range Status   Specimen Description BLOOD RIGHT ANTECUBITAL  Final   Special Requests   Final    BOTTLES DRAWN AEROBIC AND ANAEROBIC  AER 6 ML ANA 3 ML   Culture  Setup Time   Final    GRAM NEGATIVE RODS IN BOTH AEROBIC AND ANAEROBIC  BOTTLES CRITICAL RESULT CALLED TO, READ BACK BY AND VERIFIED WITH: CHRISTINE KATSOUDAS ON 05/02/16 AT 0744 BY QSD Performed at [facility]    Culture KLEBSIELLA PNEUMONIAE (A)  Final   Report Status PENDING  Incomplete  CULTURE, BLOOD (ROUTINE X 2) w Reflex to ID Panel     Status: Abnormal (Preliminary result)   Collection Time: 05/01/16  4:24 PM  Result Value Ref Range Status   Specimen Description BLOOD LEFT ANTECUBITAL  Final   Special Requests   Final    BOTTLES DRAWN AEROBIC AND ANAEROBIC  AER 6 ML ANA 6 ML   Culture  Setup Time   Final    GRAM NEGATIVE RODS IN BOTH AEROBIC AND ANAEROBIC BOTTLES CRITICAL VALUE NOTED.  VALUE IS CONSISTENT WITH PREVIOUSLY REPORTED AND CALLED VALUE. Performed at [facility]    Culture KLEBSIELLA PNEUMONIAE (A)  Final   Report Status PENDING   Incomplete  Blood Culture ID Panel (Reflexed)     Status: Abnormal   Collection Time: 05/01/16  4:24 PM  Result Value Ref Range Status   Enterococcus species NOT DETECTED NOT DETECTED Final   Listeria monocytogenes NOT DETECTED NOT DETECTED Final   Staphylococcus species NOT DETECTED NOT DETECTED Final   Staphylococcus aureus NOT DETECTED NOT DETECTED Final   Streptococcus species NOT DETECTED NOT DETECTED Final   Streptococcus agalactiae NOT DETECTED NOT DETECTED Final   Streptococcus pneumoniae NOT DETECTED NOT DETECTED Final   Streptococcus pyogenes NOT DETECTED NOT DETECTED Final   Acinetobacter baumannii NOT DETECTED NOT DETECTED Final   Enterobacteriaceae species DETECTED (A) NOT DETECTED Final    Comment: CRITICAL RESULT CALLED TO, READ BACK BY AND VERIFIED WITH: CHRISTINE KATSOUDAS ON 05/02/16 AT 0744 QSD    Enterobacter cloacae complex NOT DETECTED NOT DETECTED Final   Escherichia coli NOT DETECTED NOT DETECTED Final   Klebsiella oxytoca NOT DETECTED NOT DETECTED Final   Klebsiella pneumoniae DETECTED (A) NOT DETECTED Final    Comment: CRITICAL RESULT CALLED TO, READ BACK BY AND VERIFIED WITH: CHRISTINE KATSOUDAS ON 05/02/16 AT 0744 QSD    Proteus species NOT DETECTED NOT DETECTED Final   Serratia marcescens NOT DETECTED NOT DETECTED Final   Carbapenem resistance NOT DETECTED NOT DETECTED Final   Haemophilus influenzae NOT DETECTED NOT DETECTED Final   Neisseria meningitidis NOT DETECTED NOT DETECTED Final   Pseudomonas aeruginosa NOT DETECTED NOT DETECTED Final   Candida albicans NOT DETECTED NOT DETECTED Final   Candida glabrata NOT DETECTED NOT DETECTED Final   Candida krusei NOT DETECTED NOT DETECTED Final   Candida parapsilosis NOT DETECTED NOT DETECTED Final   Candida tropicalis NOT DETECTED NOT DETECTED Final  Culture, expectorated sputum-assessment     Status: None   Collection Time: 05/02/16  1:30 PM  Result Value Ref Range Status   Specimen Description  EXPECTORATED SPUTUM  Final   Special Requests Normal  Final   Sputum evaluation THIS SPECIMEN IS ACCEPTABLE FOR SPUTUM CULTURE  Final   Report Status 05/02/2016 FINAL  Final  Culture, respiratory (NON-Expectorated)     Status: None (Preliminary result)   Collection Time: 05/02/16  1:30 PM  Result Value Ref Range Status   Specimen Description EXPECTORATED SPUTUM  Final   Special Requests Normal Reflexed from [id number]  Final   Gram Stain   Final    MODERATE WBC PRESENT, PREDOMINANTLY PMN MODERATE SQUAMOUS EPITHELIAL CELLS PRESENT ABUNDANT GRAM NEGATIVE RODS FEW GRAM POSITIVE COCCI FEW GRAM POSITIVE RODS    Culture   Final  CULTURE REINCUBATED FOR BETTER GROWTH Performed at [facility]    Report Status PENDING  Incomplete  MRSA PCR Screening     Status: None   Collection Time: 05/03/16  4:48 PM  Result Value Ref Range Status   MRSA by PCR NEGATIVE NEGATIVE Final    Comment:        The GeneXpert MRSA Assay (FDA approved for NASAL specimens only), is one component of a comprehensive MRSA colonization surveillance program. It is not intended to diagnose MRSA infection nor to guide or monitor treatment for MRSA infections.     ANTIBIOTICS: Ceftriaxone 08/31 >> 09/02 Vancomycin 09/02 >> 09/03 Cefepime 09/02 >>    SIGNIFICANT EVENTS: 08/31>admitted with hypotension, hyperglycemia, ARF and sepsis 09/02>Transferred to the ICU for worsening hypotension, bacteremia and worsening renal failure  LINES/TUBES: Trach-chronic PIVs   ASSESSMENT / PLAN:  PULMONARY A: OSA Chronic respiratory failure Chronic tracheostomy Tracheobronchitis  P:   Trach care per protocol Supplemental O2 as needed Nebulized steroids and bronchodilators  CARDIOVASCULAR A:  Septic shock-BP improved with IV fluids  H/O Hypertension H/O Afib-rate controlled, not on anticoagulants P:  Holding anticoagulation 2/2 thrombocytopenia Need for long-term anticoagulation needs to be  addressed prior to discharge given his CHADS2 score of 2 Low dose phenylephrine to keep MAP>65 mmHg Holding all home antihypertensives Continue ASA 81 mg daily Cycle cardiac markers Echocardiogram ordered  Will need CVL if vasopressor requirements are increasing  RENAL A:   AKI, oliguric Hyponatremia P:   Nephrology following Monitor BMET intermittently Monitor I/Os Correct electrolytes as indicated  GASTROINTESTINAL A:   Nausea/vomiting/diarreah-improved P:   Advance diet as tolerated Aspiration precautions 2/2 trach-patient states that he eats regular food at home  HEMATOLOGIC A:   Thrombocytopenia - improving Anemia of chronic disease P:  DVT px: SCDs Monitor CBC intermittently Transfuse per usual guidelines  INFECTIOUS A:   UTI Klebsiella bacteremia tracheobronchitis P:   Monitor temp, WBC count Micro and abx as above  ENDOCRINE A:  Severe hyperglycemia - improved Uncontrolled DM2 P:   SSI changed to ACHS Cont Lantus  NEUROLOGIC A:   AMS, resolved Reported hx of alcohol abuse - no evidence of withdrawal P:   RASS goal: 0 Low dose PRN lorazepam   CCM time: 35 mins  Billy Fischer, MD PCCM service Mobile [phone] Pager [phone]    05/04/2016, 7:40 AM

## 2016-05-04 NOTE — Progress Notes (Signed)
Subjective:  Patient was transferred yesterday to the ICU for hypotension and placed on pressors Serum creatinine continues to get worse Respiratory culture is growing abundant gram-negative rods blood culture - Klebsiella pneumoniae    Objective:  Vital signs in last 24 hours:  Temp:  [97.6 F (36.4 C)-98.6 F (37 C)] 98.6 F (37 C) (09/03 0200) Pulse Rate:  [47-95] 64 (09/03 0700) Resp:  [17-25] 21 (09/03 0700) BP: (81-108)/(48-76) 108/76 (09/03 0700) SpO2:  [94 %-100 %] 99 % (09/03 0700)  Weight change:  Filed Weights   05/01/16 1451  Weight: 104.3 kg (230 lb)    Intake/Output:    Intake/Output Summary (Last 24 hours) at 05/04/16 1203 Last data filed at 05/04/16 0700  Gross per 24 hour  Intake          2091.98 ml  Output                0 ml  Net          2091.98 ml     Physical Exam: General: No acute distress, appears uncomfortable, laying in bed  HEENT Left corneal opacity  Neck Tracheostomy in place with drainage  Pulm/lungs Coarse breath sounds bilaterally, somewhat tachypneic  CVS/Heart Tachycardic, no rub or gallop  Abdomen:  Soft, mildly distended, nontender  Extremities: No peripheral edema  Neurologic: Alert, able to speak by covering his trach  Skin: No acute rashes          Basic Metabolic Panel:   Recent Labs Lab 05/01/16 1454 05/01/16 2308 05/02/16 0519 05/03/16 0628 05/03/16 1607 05/04/16 0349  NA 119* 123* 124* 126*  --  129*  K 4.6 3.8 4.0 4.1  --  3.9  CL 85* 89* 90* 97*  --  101  CO2 20* 23 23 19*  --  18*  GLUCOSE 522* 322* 333* 179*  --  134*  BUN 44* 52* 60* 76*  --  83*  CREATININE 2.67* 3.09* 3.52* 3.96*  --  4.21*  CALCIUM 8.9 8.6* 8.6* 8.2*  --  7.8*  MG  --   --   --   --  2.1 2.0  PHOS  --   --   --   --  2.4* 2.6  2.7     CBC:  Recent Labs Lab 05/01/16 1454 05/02/16 0519 05/04/16 0349  WBC 23.7* 17.8* 11.2*  HGB 11.1* 10.9* 10.5*  HCT 34.1* 33.0* 31.8*  MCV 73.0* 71.0* 73.0*  PLT 82* 79* 149*       Microbiology:  Recent Results (from the past 720 hour(s))  CULTURE, BLOOD (ROUTINE X 2) w Reflex to ID Panel     Status: Abnormal   Collection Time: 05/01/16  4:24 PM  Result Value Ref Range Status   Specimen Description BLOOD RIGHT ANTECUBITAL  Final   Special Requests   Final    BOTTLES DRAWN AEROBIC AND ANAEROBIC  AER 6 ML ANA 3 ML   Culture  Setup Time   Final    GRAM NEGATIVE RODS IN BOTH AEROBIC AND ANAEROBIC BOTTLES CRITICAL RESULT CALLED TO, READ BACK BY AND VERIFIED WITH: CHRISTINE KATSOUDAS ON 05/02/16 AT 0744 BY QSD Performed at [facility]    Culture KLEBSIELLA PNEUMONIAE (A)  Final   Report Status 05/04/2016 FINAL  Final   Organism ID, Bacteria KLEBSIELLA PNEUMONIAE  Final      Susceptibility   Klebsiella pneumoniae - MIC*    AMPICILLIN >=32 RESISTANT Resistant     CEFAZOLIN <=4 SENSITIVE Sensitive  CEFEPIME <=1 SENSITIVE Sensitive     CEFTAZIDIME <=1 SENSITIVE Sensitive     CEFTRIAXONE <=1 SENSITIVE Sensitive     CIPROFLOXACIN <=0.25 SENSITIVE Sensitive     GENTAMICIN <=1 SENSITIVE Sensitive     IMIPENEM <=0.25 SENSITIVE Sensitive     TRIMETH/SULFA <=20 SENSITIVE Sensitive     AMPICILLIN/SULBACTAM 4 SENSITIVE Sensitive     PIP/TAZO 16 SENSITIVE Sensitive     Extended ESBL NEGATIVE Sensitive     * KLEBSIELLA PNEUMONIAE  CULTURE, BLOOD (ROUTINE X 2) w Reflex to ID Panel     Status: Abnormal   Collection Time: 05/01/16  4:24 PM  Result Value Ref Range Status   Specimen Description BLOOD LEFT ANTECUBITAL  Final   Special Requests   Final    BOTTLES DRAWN AEROBIC AND ANAEROBIC  AER 6 ML ANA 6 ML   Culture  Setup Time   Final    GRAM NEGATIVE RODS IN BOTH AEROBIC AND ANAEROBIC BOTTLES CRITICAL VALUE NOTED.  VALUE IS CONSISTENT WITH PREVIOUSLY REPORTED AND CALLED VALUE.    Culture (A)  Final    KLEBSIELLA PNEUMONIAE SUSCEPTIBILITIES PERFORMED ON PREVIOUS CULTURE WITHIN THE LAST 5 DAYS. Performed at [facility]    Report Status  05/04/2016 FINAL  Final  Blood Culture ID Panel (Reflexed)     Status: Abnormal   Collection Time: 05/01/16  4:24 PM  Result Value Ref Range Status   Enterococcus species NOT DETECTED NOT DETECTED Final   Listeria monocytogenes NOT DETECTED NOT DETECTED Final   Staphylococcus species NOT DETECTED NOT DETECTED Final   Staphylococcus aureus NOT DETECTED NOT DETECTED Final   Streptococcus species NOT DETECTED NOT DETECTED Final   Streptococcus agalactiae NOT DETECTED NOT DETECTED Final   Streptococcus pneumoniae NOT DETECTED NOT DETECTED Final   Streptococcus pyogenes NOT DETECTED NOT DETECTED Final   Acinetobacter baumannii NOT DETECTED NOT DETECTED Final   Enterobacteriaceae species DETECTED (A) NOT DETECTED Final    Comment: CRITICAL RESULT CALLED TO, READ BACK BY AND VERIFIED WITH: CHRISTINE KATSOUDAS ON 05/02/16 AT 0744 QSD    Enterobacter cloacae complex NOT DETECTED NOT DETECTED Final   Escherichia coli NOT DETECTED NOT DETECTED Final   Klebsiella oxytoca NOT DETECTED NOT DETECTED Final   Klebsiella pneumoniae DETECTED (A) NOT DETECTED Final    Comment: CRITICAL RESULT CALLED TO, READ BACK BY AND VERIFIED WITH: CHRISTINE KATSOUDAS ON 05/02/16 AT 0744 QSD    Proteus species NOT DETECTED NOT DETECTED Final   Serratia marcescens NOT DETECTED NOT DETECTED Final   Carbapenem resistance NOT DETECTED NOT DETECTED Final   Haemophilus influenzae NOT DETECTED NOT DETECTED Final   Neisseria meningitidis NOT DETECTED NOT DETECTED Final   Pseudomonas aeruginosa NOT DETECTED NOT DETECTED Final   Candida albicans NOT DETECTED NOT DETECTED Final   Candida glabrata NOT DETECTED NOT DETECTED Final   Candida krusei NOT DETECTED NOT DETECTED Final   Candida parapsilosis NOT DETECTED NOT DETECTED Final   Candida tropicalis NOT DETECTED NOT DETECTED Final  Urine culture     Status: None   Collection Time: 05/02/16  1:12 PM  Result Value Ref Range Status   Specimen Description URINE, RANDOM  Final    Special Requests NONE  Final   Culture NO GROWTH Performed at [facility]   Final   Report Status 05/04/2016 FINAL  Final  Culture, expectorated sputum-assessment     Status: None   Collection Time: 05/02/16  1:30 PM  Result Value Ref Range Status   Specimen Description  EXPECTORATED SPUTUM  Final   Special Requests Normal  Final   Sputum evaluation THIS SPECIMEN IS ACCEPTABLE FOR SPUTUM CULTURE  Final   Report Status 05/02/2016 FINAL  Final  Culture, respiratory (NON-Expectorated)     Status: None   Collection Time: 05/02/16  1:30 PM  Result Value Ref Range Status   Specimen Description EXPECTORATED SPUTUM  Final   Special Requests Normal Reflexed from [id number]  Final   Gram Stain   Final    MODERATE WBC PRESENT, PREDOMINANTLY PMN MODERATE SQUAMOUS EPITHELIAL CELLS PRESENT ABUNDANT GRAM NEGATIVE RODS FEW GRAM POSITIVE COCCI FEW GRAM POSITIVE RODS Performed at [facility]    Culture MULTIPLE ORGANISMS PRESENT, NONE PREDOMINANT  Final   Report Status 05/04/2016 FINAL  Final  MRSA PCR Screening     Status: None   Collection Time: 05/03/16  4:48 PM  Result Value Ref Range Status   MRSA by PCR NEGATIVE NEGATIVE Final    Comment:        The GeneXpert MRSA Assay (FDA approved for NASAL specimens only), is one component of a comprehensive MRSA colonization surveillance program. It is not intended to diagnose MRSA infection nor to guide or monitor treatment for MRSA infections.     Coagulation Studies:  Recent Labs  05/03/16 1607  LABPROT 13.4  INR 1.02    Urinalysis:  Recent Labs  05/01/16 1312  COLORURINE RED*  LABSPEC 1.021  PHURINE 5.0  GLUCOSEU 150*  HGBUR 2+*  BILIRUBINUR 2+*  KETONESUR NEGATIVE  PROTEINUR 100*  NITRITE NEGATIVE  LEUKOCYTESUR 2+*      Imaging: Dg Chest 1 View  Result Date: 05/03/2016 CLINICAL DATA:  Cough and shortness of breath. EXAM: CHEST 1 VIEW COMPARISON:  05/01/2016 FINDINGS: The heart is enlarged but stable.  Stable tortuosity, ectasia and calcification of the thoracic aorta. The tracheostomy tube is in good position. No complicating features. No definite infiltrates or large effusions. Streaky bibasilar atelectasis. IMPRESSION: Stable tracheostomy tube. Stable cardiac enlargement. Streaky bibasilar atelectasis but no definite infiltrates or effusions. Electronically Signed   By: Rudie Meyer M.D.   On: 05/03/2016 08:05     Medications:   . phenylephrine (NEO-SYNEPHRINE) Adult infusion 20 mcg/min (05/04/16 1149)   . aspirin EC  81 mg Oral Daily  . brimonidine  1 drop Both Eyes Q12H   And  . timolol  1 drop Both Eyes Q12H  . budesonide (PULMICORT) nebulizer solution  0.25 mg Nebulization BID  . ceFEPime (MAXIPIME) IV  2 g Intravenous Q24H  . heparin  5,000 Units Subcutaneous Q8H  . insulin aspart  0-15 Units Subcutaneous TID WC  . insulin aspart  0-5 Units Subcutaneous QHS  . insulin glargine  20 Units Subcutaneous QHS  . ipratropium-albuterol  3 mL Nebulization Q6H  . sodium chloride flush  3 mL Intravenous Q12H   acetaminophen **OR** [DISCONTINUED] acetaminophen, LORazepam, ondansetron **OR** ondansetron (ZOFRAN) IV  Assessment/ Plan:  70 y.o.African American male with medical problems of Chronic respiratory failure with chronic  tracheostomy, legally blind in left eye due to glaucoma, hypertension, diabetes, who was admitted to [facility] on 05/01/2016 for evaluation of weakness, increased falls.   1.  Acute renal failure, likely ATN from concurrent sepsis/bacteremia U/a with TNTC RBC, WBC, protein, glucose, Billirubin Renal ultrasound is negative for obstruction/stone Baseline creatinine likely 0.89 from November 2014.  No other labs available for review  Serum creatinine continues to worsen as patient's blood pressure remains low. UOP poor No acute indication for dialysis at the  moment but with worsening renal function, he may require it in near future.  2. Hyponatremia Again likely  secondary to renal failure Sodium level is improving slowly  3.  Thrombocytopenia Workup is in progress Likely sepsis related  4.  Diabetes type 2 insulin-dependent Poor control Hb1C 10.2 %  5. Sepsis with Klebsiella Pneumonia - antibiotics as per internal medicine team - Currently getting Cefepime   LOS: 3 Dominic [name] 9/3/201712:03 PM

## 2016-05-04 NOTE — Progress Notes (Signed)
Pharmacy Antibiotic Note  Dominic [name] is a 70 y.o. male admitted on 05/01/2016 with sepsis and bacteremia (KLEBSIELLA PNEUMONIAE).  Pharmacy has been consulted for Cefepime and vancomycin dosing.  Plan: Ke: 0.022     T1/2: 32 Hr     Vd: 61    Will start the patient on Vancomycin 1gm IV every 36 hours with 6 hours stack dosing. Calculated trough at Css 15. Plan for trough level prior to 5th dose. Will continue to monitor patients renal function.  Will start cefepime 2gm IV every 24 hours based on patients CrCl <8130ml/min.     Height: 5\' 11"  (180.3 cm) Weight: 230 lb (104.3 kg) IBW/kg (Calculated) : 75.3  Temp (24hrs), Avg:98.1 F (36.7 C), Min:97.6 F (36.4 C), Max:98.6 F (37 C)   Recent Labs Lab 05/01/16 1454 05/01/16 2308 05/02/16 0519 05/03/16 0628 05/03/16 1607 05/03/16 2128 05/04/16 0349  WBC 23.7*  --  17.8*  --   --   --  11.2*  CREATININE 2.67* 3.09* 3.52* 3.96*  --   --  4.21*  LATICACIDVEN  --   --   --   --  1.4 1.5 1.1    Estimated Creatinine Clearance: 20.1 mL/min (by C-G formula based on SCr of 4.21 mg/dL).    Allergies  Allergen Reactions  . Other     Oatmeal grits  . Penicillins Hives, Rash and Other (See Comments)    Has patient had a PCN reaction causing immediate rash, facial/tongue/throat swelling, SOB or lightheadedness with hypotension: possibly. Pt isn't sure Has patient had a PCN reaction causing severe rash involving mucus membranes or skin necrosis: No Has patient had a PCN reaction that required hospitalization No Has patient had a PCN reaction occurring within the last 10 years: No If all of the above answers are "NO", then may proceed with Cephalosporin use.     Antimicrobials this admission: 8/31 Cetriaxone >> 9/2 9/2 Vancomycin  >>  9/2 Cefepime>>  Dose adjustments this admission:  Microbiology results: 8/31 BCx: KLEBSIELLA PNEUMONIAE (A) 9/1 UCx: pending 9/1 Sputum: Normal 9/2 MRSA PCR: pending  Thank you for allowing  pharmacy to be a part of this patient's care.  Stormy CardKatsoudas,Deacon Gadbois K, Catskill Regional Medical CenterRPH Clinical Pharmacist 05/04/2016 7:58 AM

## 2016-05-04 NOTE — Progress Notes (Signed)
Pt continues to cough and wiping at trach. Has tan colored drainage from trach, did have sips of ensure per order. Started on Neo gtt for low blood pressure. Remains afib on the monitor. RT changed inner cannula at beginning of the shift.

## 2016-05-05 LAB — COMPREHENSIVE METABOLIC PANEL
ALK PHOS: 359 U/L — AB (ref 38–126)
ALT: 21 U/L (ref 17–63)
AST: 25 U/L (ref 15–41)
Albumin: 2 g/dL — ABNORMAL LOW (ref 3.5–5.0)
Anion gap: 10 (ref 5–15)
BILIRUBIN TOTAL: 4.9 mg/dL — AB (ref 0.3–1.2)
BUN: 87 mg/dL — AB (ref 6–20)
CALCIUM: 8.1 mg/dL — AB (ref 8.9–10.3)
CHLORIDE: 103 mmol/L (ref 101–111)
CO2: 17 mmol/L — ABNORMAL LOW (ref 22–32)
CREATININE: 4.56 mg/dL — AB (ref 0.61–1.24)
GFR calc Af Amer: 14 mL/min — ABNORMAL LOW (ref >60)
GFR, EST NON AFRICAN AMERICAN: 12 mL/min — AB (ref >60)
Glucose, Bld: 134 mg/dL — ABNORMAL HIGH (ref 65–99)
Potassium: 3.9 mmol/L (ref 3.5–5.1)
Sodium: 130 mmol/L — ABNORMAL LOW (ref 135–145)
Total Protein: 6.6 g/dL (ref 6.5–8.1)

## 2016-05-05 LAB — CBC
HEMATOCRIT: 29.4 % — AB (ref 40.0–52.0)
HEMOGLOBIN: 9.6 g/dL — AB (ref 13.0–18.0)
MCH: 23.4 pg — AB (ref 26.0–34.0)
MCHC: 32.5 g/dL (ref 32.0–36.0)
MCV: 72 fL — AB (ref 80.0–100.0)
PLATELETS: 225 10*3/uL (ref 150–440)
RBC: 4.09 MIL/uL — AB (ref 4.40–5.90)
RDW: 14.3 % (ref 11.5–14.5)
WBC: 12.5 10*3/uL — AB (ref 3.8–10.6)

## 2016-05-05 LAB — GLUCOSE, CAPILLARY
Glucose-Capillary: 129 mg/dL — ABNORMAL HIGH (ref 65–99)
Glucose-Capillary: 145 mg/dL — ABNORMAL HIGH (ref 65–99)
Glucose-Capillary: 119 mg/dL — ABNORMAL HIGH (ref 65–99)

## 2016-05-05 LAB — HEPATITIS C ANTIBODY

## 2016-05-05 LAB — PROCALCITONIN: Procalcitonin: 77.5 ng/mL

## 2016-05-05 MED ORDER — SENNOSIDES-DOCUSATE SODIUM 8.6-50 MG PO TABS: 2.0000 | ORAL_TABLET | Freq: Every evening | ORAL | Status: DC | PRN

## 2016-05-05 MED ORDER — DEXTROSE 5 % IV SOLN
2.0000 g | INTRAVENOUS | Status: DC
  Administered 2016-05-05 – 2016-05-12 (×8): 2 g via INTRAVENOUS
  Filled 2016-05-05 (×8): qty 2

## 2016-05-05 MED ORDER — MICONAZOLE NITRATE POWD: Status: DC | PRN

## 2016-05-05 MED ORDER — ALUM & MAG HYDROXIDE-SIMETH 200-200-20 MG/5ML PO SUSP: 30.0000 mL | Freq: Four times a day (QID) | ORAL | Status: DC | PRN

## 2016-05-05 MED ORDER — SODIUM CHLORIDE 0.9 % IV SOLN
INTRAVENOUS | Status: DC
  Administered 2016-05-05 – 2016-05-06 (×2): via INTRAVENOUS

## 2016-05-05 MED ORDER — FAMOTIDINE IN NACL 20-0.9 MG/50ML-% IV SOLN
20.0000 mg | INTRAVENOUS | Status: DC
  Administered 2016-05-05 – 2016-05-06 (×2): 20 mg via INTRAVENOUS
  Filled 2016-05-05 (×2): qty 50

## 2016-05-05 NOTE — Progress Notes (Signed)
PULMONARY / CRITICAL CARE MEDICINE   Name: Dominic [name] H Kazanjian MRN: [id number] DOB: [birth year]    ADMISSION DATE:  05/01/2016  CONSULTATION DATE:  05/03/2016   REFERRING MD: Dr. Alford Highlandichard Wieting   PT PROFILE:   [age] M with sleep apnea/chronic respiratory failure, chronic trach, CAF, DM2, and hypertension who initially admitted 08/31 to hospitalist service with UTI and Klebsiella bacteremia. Also noted to have AKI and severe hyperglycemia. Transferred to ICU 09/02 with hypotension.and rising Cr. Vasopressors initiated.   SUBJECTIVE:  Improved secretions but worsening kidney function. Unable to optimally assess urine output due to incontinence. Unable to insert foley>?urethral strictures. No fever. Abdomen mildly distended, no BM in 3 days  VITAL SIGNS: BP (!) 104/54   Pulse 64   Temp 97.3 F (36.3 C) (Oral)   Resp 18   Ht 5\' 11"  (1.803 m)   Wt 230 lb (104.3 kg)   SpO2 97%   BMI 32.08 kg/m   HEMODYNAMICS:    VENTILATOR SETTINGS:    INTAKE / OUTPUT: I/O last 3 completed shifts: In: 2534.2 [P.O.:30; I.V.:2044.2; Other:10; IV Piggyback:450] Out: -   PHYSICAL EXAMINATION: General: NAD Neuro: RASS 0, no focal deficits, cognition intact HEENT: [place]/AT, trahc site clean Cardiovascular:  IRIR, no MRG Lungs: +rhonchi, no wheezing Abdomen: Distended, normal bowel sounds Extremities: +2 pulses, no edema.  LABS:  BMET  Recent Labs Lab 05/02/16 0519 05/03/16 0628 05/04/16 0349  NA 124* 126* 129*  K 4.0 4.1 3.9  CL 90* 97* 101  CO2 23 19* 18*  BUN 60* 76* 83*  CREATININE 3.52* 3.96* 4.21*  GLUCOSE 333* 179* 134*    Electrolytes  Recent Labs Lab 05/02/16 0519 05/03/16 0628 05/03/16 1607 05/04/16 0349  CALCIUM 8.6* 8.2*  --  7.8*  MG  --   --  2.1 2.0  PHOS  --   --  2.4* 2.6  2.7    CBC  Recent Labs Lab 05/01/16 1454 05/02/16 0519 05/04/16 0349  WBC 23.7* 17.8* 11.2*  HGB 11.1* 10.9* 10.5*  HCT 34.1* 33.0* 31.8*  PLT 82* 79* 149*    Coag's  Recent  Labs Lab 05/03/16 1607  APTT 33  INR 1.02    Sepsis Markers  Recent Labs Lab 05/03/16 1607 05/03/16 2128 05/04/16 0349  LATICACIDVEN 1.4 1.5 1.1  PROCALCITON >154.00  --  141.62    ABG  Recent Labs Lab 05/03/16 1155  PHART 7.36  PCO2ART 30*  PO2ART 77*    Liver Enzymes  Recent Labs Lab 05/03/16 1607 05/04/16 0349  AST 34  --   ALT 25  --   ALKPHOS 328*  --   BILITOT 8.3*  --   ALBUMIN 2.2* 1.9*    Cardiac Enzymes  Recent Labs Lab 05/03/16 1607 05/03/16 2128 05/04/16 0349  TROPONINI 0.04* 0.06* 0.04*    Glucose  Recent Labs Lab 05/04/16 0001 05/04/16 0405 05/04/16 0709 05/04/16 1137 05/04/16 1652 05/04/16 2155  GLUCAP 126* 129* 135* 135* 124* 129*    Imaging No results found. STUDIES:  05/03/2016: 2-D echo  CULTURES: 05/01/16 Blood cultures: Klebsiella pneumoniae x 2 bottles 05/02/16: Sputum culture-multiple organisms  ANTIBIOTICS: Ceftriaxone 08/31 >> 09/02 Vancomycin 09/02 >> 09/03 Cefepime 09/02 >>    SIGNIFICANT EVENTS: 08/31>admitted with hypotension, hyperglycemia, ARF and sepsis 09/02>Transferred to the ICU for worsening hypotension, bacteremia and worsening renal failure  LINES/TUBES: Trach-chronic PIVs   ASSESSMENT / PLAN:  PULMONARY A: OSA Chronic respiratory failure Chronic tracheostomy Tracheobronchitis-improved tracheal secretions P:   Trach care per protocol  Supplemental O2 as needed Nebulized steroids and bronchodilators  CARDIOVASCULAR A:  Septic shock-off pressors H/O Hypertension H/O Afib-rate controlled, not on anticoagulants; 2 D echo with LVEF 60-65%, grade 1 diastolic dysfunction P:  Holding anticoagulation 2/2 thrombocytopenia Need for long-term anticoagulation needs to be addressed prior to discharge given his CHADS2 score of 2 Low dose phenylephrine prn to keep MAP>65 mmHg Holding all home antihypertensives Continue ASA 81 mg daily Will need CVL if vasopressor requirements are  increasing  RENAL A:   AKI with worsening creatinine-not oliguric; wet sheets noted; unable to insert foley Hyponatremia P:   Nephrology following Monitor BMET intermittently Monitor I/Os Correct electrolytes as indicated Condom catheter with strict Is/Os if possible  GASTROINTESTINAL A:   Nausea/vomiting/diarrhea-patient now with mild constipation.  P:   Heart healthy/Carb modified diet  Aspiration precautions 2/2 trach-patient states that he eats regular food at home Lactulose given x 1 with effect.  Senna 2 tabs qhs prn  HEMATOLOGIC A:   Thrombocytopenia - improving Anemia of chronic disease P:  DVT px: SCDs Monitor CBC intermittently Transfuse per usual guidelines  INFECTIOUS A:   UTI Klebsiella bacteremia tracheobronchitis P:   Monitor temp, WBC count Micro and abx as above  ENDOCRINE A:  Severe hyperglycemia - improved Uncontrolled DM2 P:   SSI changed to ACHS Cont Lantus  NEUROLOGIC A:   AMS, resolved Reported hx of alcohol abuse - no evidence of withdrawal P:   RASS goal: 0 Low dose PRN lorazepam   CCM time=35 minutes  Magdalene S. [facility] ANP-BC Pulmonary and Critical Care Medicine [facility] Pager [phone] or [phone]   05/05/2016, 3:18 AM   Patient seen and examined with NP, agree with assessment and plan. 70-year-old male admitted with UTI with septic shock, hypotension. Patient now weaned off of pressors, continues with acute on chronic kidney disease, we'll continue to monitor, nephro following.  Dominic [name], M.D.  05/05/2016   Critical Care Attestation.  I have personally obtained a history, examined the patient, evaluated laboratory and imaging results, formulated the assessment and plan and placed orders. The Patient requires high complexity decision making for assessment and support, frequent evaluation and titration of therapies, application of advanced monitoring technologies and extensive interpretation of  multiple databases. The patient has critical illness that could lead imminently to failure of 1 or more organ systems and requires the highest level of physician preparedness to intervene.  Critical Care Time devoted to patient care services described in this note is 35 minutes and is exclusive of time spent in procedures.

## 2016-05-05 NOTE — Progress Notes (Signed)
[employer] PROGRESS NOTE  Dominic [name] ZOX:[id number] DOB: 12-Jan-1946 DOA: 05/01/2016 PCP: No primary care provider on file.  HPI/Subjective: Patient off pressors since last night. Blood pressure is holding on its own. Patient asking for something for acid reflux. Still with some cough and wheeze.  Objective: Vitals:   05/05/16 1200 05/05/16 1300  BP: (!) 111/53 (!) 113/57  Pulse: 61 63  Resp: 17 (!) 23  Temp:      Filed Weights   05/01/16 1451  Weight: 104.3 kg (230 lb)    ROS: Review of Systems  Constitutional: Negative for chills and fever.  Eyes: Negative for blurred vision.  Respiratory: Positive for cough and wheezing. Negative for shortness of breath.   Cardiovascular: Negative for chest pain.  Gastrointestinal: Positive for heartburn. Negative for abdominal pain, constipation, diarrhea, nausea and vomiting.  Genitourinary: Negative for dysuria.  Musculoskeletal: Negative for joint pain.  Neurological: Negative for dizziness and headaches.   Exam: Physical Exam  HENT:  Nose: No mucosal edema.  Mouth/Throat: No oropharyngeal exudate or posterior oropharyngeal edema.  Eyes: Conjunctivae and lids are normal.  Left eye clouded over.  Neck: No JVD present. Carotid bruit is not present. No edema present. No thyroid mass and no thyromegaly present.  Cardiovascular: S1 normal and S2 normal.  Exam reveals no gallop.   No murmur heard. Pulses:      Dorsalis pedis pulses are 2+ on the right side, and 2+ on the left side.  Respiratory: No respiratory distress. He has decreased breath sounds in the right lower field and the left lower field. He has wheezes in the right middle field, the right lower field, the left middle field and the left lower field. He has no rhonchi. He has no rales.  GI: Soft. Bowel sounds are normal. There is no tenderness.  Musculoskeletal:       Right ankle: He exhibits swelling.       Left ankle: He exhibits swelling.  Lymphadenopathy:    He  has no cervical adenopathy.  Neurological: He is alert. No cranial nerve deficit.  Skin: Skin is warm. No rash noted. Nails show no clubbing.  Psychiatric: He has a normal mood and affect.      Data Reviewed: Basic Metabolic Panel:  Recent Labs Lab 05/01/16 2308 05/02/16 0519 05/03/16 0628 05/03/16 1607 05/04/16 0349 05/05/16 0621  NA 123* 124* 126*  --  129* 130*  K 3.8 4.0 4.1  --  3.9 3.9  CL 89* 90* 97*  --  101 103  CO2 23 23 19*  --  18* 17*  GLUCOSE 322* 333* 179*  --  134* 134*  BUN 52* 60* 76*  --  83* 87*  CREATININE 3.09* 3.52* 3.96*  --  4.21* 4.56*  CALCIUM 8.6* 8.6* 8.2*  --  7.8* 8.1*  MG  --   --   --  2.1 2.0  --   PHOS  --   --   --  2.4* 2.6  2.7  --    Liver Function Tests:  Recent Labs Lab 05/03/16 1607 05/04/16 0349 05/05/16 0621  AST 34  --  25  ALT 25  --  21  ALKPHOS 328*  --  359*  BILITOT 8.3*  --  4.9*  PROT 6.6  --  6.6  ALBUMIN 2.2* 1.9* 2.0*   CBC:  Recent Labs Lab 05/01/16 1454 05/02/16 0519 05/04/16 0349 05/05/16 0621  WBC 23.7* 17.8* 11.2* 12.5*  HGB 11.1* 10.9* 10.5* 9.6*  HCT 34.1* 33.0* 31.8* 29.4*  MCV 73.0* 71.0* 73.0* 72.0*  PLT 82* 79* 149* 225   Cardiac Enzymes:  Recent Labs Lab 05/01/16 1454 05/03/16 0628 05/03/16 1607 05/03/16 2128 05/04/16 0349  CKTOTAL 701* 96  --   --  66  TROPONINI  --   --  0.04* 0.06* 0.04*   CBG:  Recent Labs Lab 05/04/16 1137 05/04/16 1652 05/04/16 2155 05/05/16 0701 05/05/16 1226  GLUCAP 135* 124* 129* 129* 145*    Recent Results (from the past 240 hour(s))  CULTURE, BLOOD (ROUTINE X 2) w Reflex to ID Panel     Status: Abnormal   Collection Time: 05/01/16  4:24 PM  Result Value Ref Range Status   Specimen Description BLOOD RIGHT ANTECUBITAL  Final   Special Requests   Final    BOTTLES DRAWN AEROBIC AND ANAEROBIC  AER 6 ML ANA 3 ML   Culture  Setup Time   Final    GRAM NEGATIVE RODS IN BOTH AEROBIC AND ANAEROBIC BOTTLES CRITICAL RESULT CALLED TO, READ BACK  BY AND VERIFIED WITH: CHRISTINE KATSOUDAS ON 05/02/16 AT 0744 BY QSD Performed at [facility]    Culture KLEBSIELLA PNEUMONIAE (A)  Final   Report Status 05/04/2016 FINAL  Final   Organism ID, Bacteria KLEBSIELLA PNEUMONIAE  Final      Susceptibility   Klebsiella pneumoniae - MIC*    AMPICILLIN >=32 RESISTANT Resistant     CEFAZOLIN <=4 SENSITIVE Sensitive     CEFEPIME <=1 SENSITIVE Sensitive     CEFTAZIDIME <=1 SENSITIVE Sensitive     CEFTRIAXONE <=1 SENSITIVE Sensitive     CIPROFLOXACIN <=0.25 SENSITIVE Sensitive     GENTAMICIN <=1 SENSITIVE Sensitive     IMIPENEM <=0.25 SENSITIVE Sensitive     TRIMETH/SULFA <=20 SENSITIVE Sensitive     AMPICILLIN/SULBACTAM 4 SENSITIVE Sensitive     PIP/TAZO 16 SENSITIVE Sensitive     Extended ESBL NEGATIVE Sensitive     * KLEBSIELLA PNEUMONIAE  CULTURE, BLOOD (ROUTINE X 2) w Reflex to ID Panel     Status: Abnormal   Collection Time: 05/01/16  4:24 PM  Result Value Ref Range Status   Specimen Description BLOOD LEFT ANTECUBITAL  Final   Special Requests   Final    BOTTLES DRAWN AEROBIC AND ANAEROBIC  AER 6 ML ANA 6 ML   Culture  Setup Time   Final    GRAM NEGATIVE RODS IN BOTH AEROBIC AND ANAEROBIC BOTTLES CRITICAL VALUE NOTED.  VALUE IS CONSISTENT WITH PREVIOUSLY REPORTED AND CALLED VALUE.    Culture (A)  Final    KLEBSIELLA PNEUMONIAE SUSCEPTIBILITIES PERFORMED ON PREVIOUS CULTURE WITHIN THE LAST 5 DAYS. Performed at [facility]    Report Status 05/04/2016 FINAL  Final  Blood Culture ID Panel (Reflexed)     Status: Abnormal   Collection Time: 05/01/16  4:24 PM  Result Value Ref Range Status   Enterococcus species NOT DETECTED NOT DETECTED Final   Listeria monocytogenes NOT DETECTED NOT DETECTED Final   Staphylococcus species NOT DETECTED NOT DETECTED Final   Staphylococcus aureus NOT DETECTED NOT DETECTED Final   Streptococcus species NOT DETECTED NOT DETECTED Final   Streptococcus agalactiae NOT DETECTED NOT DETECTED  Final   Streptococcus pneumoniae NOT DETECTED NOT DETECTED Final   Streptococcus pyogenes NOT DETECTED NOT DETECTED Final   Acinetobacter baumannii NOT DETECTED NOT DETECTED Final   Enterobacteriaceae species DETECTED (A) NOT DETECTED Final    Comment: CRITICAL RESULT CALLED TO, READ BACK BY AND VERIFIED  WITH: CHRISTINE KATSOUDAS ON 05/02/16 AT 0744 QSD    Enterobacter cloacae complex NOT DETECTED NOT DETECTED Final   Escherichia coli NOT DETECTED NOT DETECTED Final   Klebsiella oxytoca NOT DETECTED NOT DETECTED Final   Klebsiella pneumoniae DETECTED (A) NOT DETECTED Final    Comment: CRITICAL RESULT CALLED TO, READ BACK BY AND VERIFIED WITH: CHRISTINE KATSOUDAS ON 05/02/16 AT 0744 QSD    Proteus species NOT DETECTED NOT DETECTED Final   Serratia marcescens NOT DETECTED NOT DETECTED Final   Carbapenem resistance NOT DETECTED NOT DETECTED Final   Haemophilus influenzae NOT DETECTED NOT DETECTED Final   Neisseria meningitidis NOT DETECTED NOT DETECTED Final   Pseudomonas aeruginosa NOT DETECTED NOT DETECTED Final   Candida albicans NOT DETECTED NOT DETECTED Final   Candida glabrata NOT DETECTED NOT DETECTED Final   Candida krusei NOT DETECTED NOT DETECTED Final   Candida parapsilosis NOT DETECTED NOT DETECTED Final   Candida tropicalis NOT DETECTED NOT DETECTED Final  Urine culture     Status: None   Collection Time: 05/02/16  1:12 PM  Result Value Ref Range Status   Specimen Description URINE, RANDOM  Final   Special Requests NONE  Final   Culture NO GROWTH Performed at [facility]   Final   Report Status 05/04/2016 FINAL  Final  Culture, expectorated sputum-assessment     Status: None   Collection Time: 05/02/16  1:30 PM  Result Value Ref Range Status   Specimen Description EXPECTORATED SPUTUM  Final   Special Requests Normal  Final   Sputum evaluation THIS SPECIMEN IS ACCEPTABLE FOR SPUTUM CULTURE  Final   Report Status 05/02/2016 FINAL  Final  Culture, respiratory  (NON-Expectorated)     Status: None   Collection Time: 05/02/16  1:30 PM  Result Value Ref Range Status   Specimen Description EXPECTORATED SPUTUM  Final   Special Requests Normal Reflexed from [id number]  Final   Gram Stain   Final    MODERATE WBC PRESENT, PREDOMINANTLY PMN MODERATE SQUAMOUS EPITHELIAL CELLS PRESENT ABUNDANT GRAM NEGATIVE RODS FEW GRAM POSITIVE COCCI FEW GRAM POSITIVE RODS Performed at [facility]    Culture MULTIPLE ORGANISMS PRESENT, NONE PREDOMINANT  Final   Report Status 05/04/2016 FINAL  Final  MRSA PCR Screening     Status: None   Collection Time: 05/03/16  4:48 PM  Result Value Ref Range Status   MRSA by PCR NEGATIVE NEGATIVE Final    Comment:        The GeneXpert MRSA Assay (FDA approved for NASAL specimens only), is one component of a comprehensive MRSA colonization surveillance program. It is not intended to diagnose MRSA infection nor to guide or monitor treatment for MRSA infections.      Scheduled Meds: . aspirin EC  81 mg Oral Daily  . brimonidine  1 drop Both Eyes Q12H   And  . timolol  1 drop Both Eyes Q12H  . budesonide (PULMICORT) nebulizer solution  0.25 mg Nebulization BID  . cefTRIAXone (ROCEPHIN)  IV  2 g Intravenous Q24H  . famotidine (PEPCID) IV  20 mg Intravenous Q24H  . heparin  5,000 Units Subcutaneous Q8H  . insulin aspart  0-15 Units Subcutaneous TID WC  . insulin aspart  0-5 Units Subcutaneous QHS  . insulin glargine  20 Units Subcutaneous QHS  . ipratropium-albuterol  3 mL Nebulization Q6H  . sodium chloride flush  3 mL Intravenous Q12H   Continuous Infusions: . sodium chloride 40 mL/hr at 05/05/16 1229  Assessment/Plan:  1. Severe sepsis with multiorgan failure including shock and acute renal failure. Switch antibiotics back to Rocephin since the Klebsiella is sensitive. 2. Shock resolved. Patient was on pressors for 2 days 3. Acute kidney injury and hyponatremia. Creatinine worsening on a daily basis.  Nephrology following. Close monitoring. Gentle IV fluids. 4. Chronic thrombocytopenia. Looking back at old labs this is likely secondary to alcohol. 5. Type 2 diabetes uncontrolled. On Lantus and sliding scale. 6. Mild rhabdomyolysis. Simvastatin stopped. CPK trended normal range. 7. Hyperlipidemia unspecified stop simvastatin 8. Weakness. Physical therapy evaluation 9. Alcohol abuse. CIWA protocol. 10. Chronic tracheostomy patient. Continue nebulizer treatments. Oxygenating well on room air  Code Status:     Code Status Orders        Start     Ordered   05/01/16 1757  Full code  Continuous     05/01/16 1756    Code Status History    Date Active Date Inactive Code Status Order ID Comments User Context   05/01/2016  5:56 PM 05/04/2016  5:57 AM Full Code [id number]  Enid Baasadhika Kalisetti, MD Inpatient     Disposition Plan: To be determined  Consultants:  Nephrology  Critical care specialist  Antibiotics:  Rocephin  Time spent: 25 minutes. Patient can be transferred out of the ICU today  Alford HighlandWIETING, Doaa Kendzierski  [employer]

## 2016-05-05 NOTE — Progress Notes (Signed)
PT Cancellation Note  Patient Details Name: Dominic [name] MRN: [id number] DOB: 09/17/1945   Cancelled Treatment:    Reason Eval/Treat Not Completed: Patient declined, no reason specified. Patient declines any attempt at therapy today due to fatigue/lethargy. PT will re-attempt at a later time/date.   Kerin RansomPatrick A McNamara, PT, DPT    05/05/2016, 3:05 PM

## 2016-05-05 NOTE — Progress Notes (Signed)
Received handoff report from Madelaine BhatAdam and Luisa Hartatrick, told that pt. Has been on room air with Tracheostomy and tolerating without complication. Checked in with the RT who reported that pt. Refuses any form of humidified air, MD's are aware, will continue to monitor pt. Closely.

## 2016-05-05 NOTE — Progress Notes (Signed)
Unable to place foley as ordered by Ms. Patria Mane NP,  met resistance.  Urology consult, needed if future foley catheter placement is desired. Placed condom catheter x 3. Discussed w/ Ms. Patria Mane, NP pt.'s UOP for evening and Bladder Scan results.  Will continue to monitor pt. closely

## 2016-05-05 NOTE — Progress Notes (Signed)
Subjective:  Patient was transferred to the ICU for hypotension and placed on pressors Now off pressors since last night Serum creatinine continues to get worse.  4.21 -> 4.56 Respiratory culture is growing abundant gram-negative rods blood culture - Klebsiella pneumoniae    Objective:  Vital signs in last 24 hours:  Temp:  [97.3 F (36.3 C)-98.4 F (36.9 C)] 97.5 F (36.4 C) (09/04 0400) Pulse Rate:  [41-98] 53 (09/04 0800) Resp:  [15-26] 26 (09/04 0800) BP: (73-135)/(46-91) 116/61 (09/04 0800) SpO2:  [84 %-100 %] 84 % (09/04 0800)  Weight change:  Filed Weights   05/01/16 1451  Weight: 104.3 kg (230 lb)    Intake/Output:    Intake/Output Summary (Last 24 hours) at 05/05/16 0943 Last data filed at 05/05/16 0700  Gross per 24 hour  Intake              517 ml  Output              105 ml  Net              412 ml     Physical Exam: General: No acute distress, appears uncomfortable, laying in bed  HEENT Left corneal opacity  Neck Tracheostomy in place with drainage  Pulm/lungs Coarse breath sounds bilaterally, somewhat tachypneic  CVS/Heart Tachycardic, no rub or gallop  Abdomen:  Soft, mildly distended, nontender  Extremities: No peripheral edema  Neurologic: Alert, able to speak by covering his trach  Skin: No acute rashes          Basic Metabolic Panel:   Recent Labs Lab 05/01/16 2308 05/02/16 0519 05/03/16 0628 05/03/16 1607 05/04/16 0349 05/05/16 0621  NA 123* 124* 126*  --  129* 130*  K 3.8 4.0 4.1  --  3.9 3.9  CL 89* 90* 97*  --  101 103  CO2 23 23 19*  --  18* 17*  GLUCOSE 322* 333* 179*  --  134* 134*  BUN 52* 60* 76*  --  83* 87*  CREATININE 3.09* 3.52* 3.96*  --  4.21* 4.56*  CALCIUM 8.6* 8.6* 8.2*  --  7.8* 8.1*  MG  --   --   --  2.1 2.0  --   PHOS  --   --   --  2.4* 2.6  2.7  --      CBC:  Recent Labs Lab 05/01/16 1454 05/02/16 0519 05/04/16 0349 05/05/16 0621  WBC 23.7* 17.8* 11.2* 12.5*  HGB 11.1* 10.9* 10.5* 9.6*   HCT 34.1* 33.0* 31.8* 29.4*  MCV 73.0* 71.0* 73.0* 72.0*  PLT 82* 79* 149* 225      Microbiology:  Recent Results (from the past 720 hour(s))  CULTURE, BLOOD (ROUTINE X 2) w Reflex to ID Panel     Status: Abnormal   Collection Time: 05/01/16  4:24 PM  Result Value Ref Range Status   Specimen Description BLOOD RIGHT ANTECUBITAL  Final   Special Requests   Final    BOTTLES DRAWN AEROBIC AND ANAEROBIC  AER 6 ML ANA 3 ML   Culture  Setup Time   Final    GRAM NEGATIVE RODS IN BOTH AEROBIC AND ANAEROBIC BOTTLES CRITICAL RESULT CALLED TO, READ BACK BY AND VERIFIED WITH: CHRISTINE KATSOUDAS ON 05/02/16 AT 0744 BY QSD Performed at [facility]    Culture KLEBSIELLA PNEUMONIAE (A)  Final   Report Status 05/04/2016 FINAL  Final   Organism ID, Bacteria KLEBSIELLA PNEUMONIAE  Final  Susceptibility   Klebsiella pneumoniae - MIC*    AMPICILLIN >=32 RESISTANT Resistant     CEFAZOLIN <=4 SENSITIVE Sensitive     CEFEPIME <=1 SENSITIVE Sensitive     CEFTAZIDIME <=1 SENSITIVE Sensitive     CEFTRIAXONE <=1 SENSITIVE Sensitive     CIPROFLOXACIN <=0.25 SENSITIVE Sensitive     GENTAMICIN <=1 SENSITIVE Sensitive     IMIPENEM <=0.25 SENSITIVE Sensitive     TRIMETH/SULFA <=20 SENSITIVE Sensitive     AMPICILLIN/SULBACTAM 4 SENSITIVE Sensitive     PIP/TAZO 16 SENSITIVE Sensitive     Extended ESBL NEGATIVE Sensitive     * KLEBSIELLA PNEUMONIAE  CULTURE, BLOOD (ROUTINE X 2) w Reflex to ID Panel     Status: Abnormal   Collection Time: 05/01/16  4:24 PM  Result Value Ref Range Status   Specimen Description BLOOD LEFT ANTECUBITAL  Final   Special Requests   Final    BOTTLES DRAWN AEROBIC AND ANAEROBIC  AER 6 ML ANA 6 ML   Culture  Setup Time   Final    GRAM NEGATIVE RODS IN BOTH AEROBIC AND ANAEROBIC BOTTLES CRITICAL VALUE NOTED.  VALUE IS CONSISTENT WITH PREVIOUSLY REPORTED AND CALLED VALUE.    Culture (A)  Final    KLEBSIELLA PNEUMONIAE SUSCEPTIBILITIES PERFORMED ON PREVIOUS CULTURE  WITHIN THE LAST 5 DAYS. Performed at [facility]    Report Status 05/04/2016 FINAL  Final  Blood Culture ID Panel (Reflexed)     Status: Abnormal   Collection Time: 05/01/16  4:24 PM  Result Value Ref Range Status   Enterococcus species NOT DETECTED NOT DETECTED Final   Listeria monocytogenes NOT DETECTED NOT DETECTED Final   Staphylococcus species NOT DETECTED NOT DETECTED Final   Staphylococcus aureus NOT DETECTED NOT DETECTED Final   Streptococcus species NOT DETECTED NOT DETECTED Final   Streptococcus agalactiae NOT DETECTED NOT DETECTED Final   Streptococcus pneumoniae NOT DETECTED NOT DETECTED Final   Streptococcus pyogenes NOT DETECTED NOT DETECTED Final   Acinetobacter baumannii NOT DETECTED NOT DETECTED Final   Enterobacteriaceae species DETECTED (A) NOT DETECTED Final    Comment: CRITICAL RESULT CALLED TO, READ BACK BY AND VERIFIED WITH: CHRISTINE KATSOUDAS ON 05/02/16 AT 0744 QSD    Enterobacter cloacae complex NOT DETECTED NOT DETECTED Final   Escherichia coli NOT DETECTED NOT DETECTED Final   Klebsiella oxytoca NOT DETECTED NOT DETECTED Final   Klebsiella pneumoniae DETECTED (A) NOT DETECTED Final    Comment: CRITICAL RESULT CALLED TO, READ BACK BY AND VERIFIED WITH: CHRISTINE KATSOUDAS ON 05/02/16 AT 0744 QSD    Proteus species NOT DETECTED NOT DETECTED Final   Serratia marcescens NOT DETECTED NOT DETECTED Final   Carbapenem resistance NOT DETECTED NOT DETECTED Final   Haemophilus influenzae NOT DETECTED NOT DETECTED Final   Neisseria meningitidis NOT DETECTED NOT DETECTED Final   Pseudomonas aeruginosa NOT DETECTED NOT DETECTED Final   Candida albicans NOT DETECTED NOT DETECTED Final   Candida glabrata NOT DETECTED NOT DETECTED Final   Candida krusei NOT DETECTED NOT DETECTED Final   Candida parapsilosis NOT DETECTED NOT DETECTED Final   Candida tropicalis NOT DETECTED NOT DETECTED Final  Urine culture     Status: None   Collection Time: 05/02/16  1:12 PM   Result Value Ref Range Status   Specimen Description URINE, RANDOM  Final   Special Requests NONE  Final   Culture NO GROWTH Performed at [facility]   Final   Report Status 05/04/2016 FINAL  Final  Culture,  expectorated sputum-assessment     Status: None   Collection Time: 05/02/16  1:30 PM  Result Value Ref Range Status   Specimen Description EXPECTORATED SPUTUM  Final   Special Requests Normal  Final   Sputum evaluation THIS SPECIMEN IS ACCEPTABLE FOR SPUTUM CULTURE  Final   Report Status 05/02/2016 FINAL  Final  Culture, respiratory (NON-Expectorated)     Status: None   Collection Time: 05/02/16  1:30 PM  Result Value Ref Range Status   Specimen Description EXPECTORATED SPUTUM  Final   Special Requests Normal Reflexed from [id number]  Final   Gram Stain   Final    MODERATE WBC PRESENT, PREDOMINANTLY PMN MODERATE SQUAMOUS EPITHELIAL CELLS PRESENT ABUNDANT GRAM NEGATIVE RODS FEW GRAM POSITIVE COCCI FEW GRAM POSITIVE RODS Performed at [facility]    Culture MULTIPLE ORGANISMS PRESENT, NONE PREDOMINANT  Final   Report Status 05/04/2016 FINAL  Final  MRSA PCR Screening     Status: None   Collection Time: 05/03/16  4:48 PM  Result Value Ref Range Status   MRSA by PCR NEGATIVE NEGATIVE Final    Comment:        The GeneXpert MRSA Assay (FDA approved for NASAL specimens only), is one component of a comprehensive MRSA colonization surveillance program. It is not intended to diagnose MRSA infection nor to guide or monitor treatment for MRSA infections.     Coagulation Studies:  Recent Labs  05/03/16 1607  LABPROT 13.4  INR 1.02    Urinalysis: No results for input(s): COLORURINE, LABSPEC, PHURINE, GLUCOSEU, HGBUR, BILIRUBINUR, KETONESUR, PROTEINUR, UROBILINOGEN, NITRITE, LEUKOCYTESUR in the last 72 hours.  Invalid input(s): APPERANCEUR    Imaging: No results found.   Medications:   . phenylephrine (NEO-SYNEPHRINE) Adult infusion Stopped  (05/04/16 2131)   . aspirin EC  81 mg Oral Daily  . brimonidine  1 drop Both Eyes Q12H   And  . timolol  1 drop Both Eyes Q12H  . budesonide (PULMICORT) nebulizer solution  0.25 mg Nebulization BID  . ceFEPime (MAXIPIME) IV  2 g Intravenous Q24H  . heparin  5,000 Units Subcutaneous Q8H  . insulin aspart  0-15 Units Subcutaneous TID WC  . insulin aspart  0-5 Units Subcutaneous QHS  . insulin glargine  20 Units Subcutaneous QHS  . ipratropium-albuterol  3 mL Nebulization Q6H  . sodium chloride flush  3 mL Intravenous Q12H   acetaminophen **OR** [DISCONTINUED] acetaminophen, LORazepam, miconazole nitrate, ondansetron **OR** ondansetron (ZOFRAN) IV, senna-docusate  Assessment/ Plan:  70 y.o.African American male with medical problems of Chronic respiratory failure with chronic  tracheostomy, legally blind in left eye due to glaucoma, hypertension, diabetes, who was admitted to [facility] on 05/01/2016 for evaluation of weakness, increased falls.   1.  Acute renal failure, likely ATN from concurrent sepsis/bacteremia U/a with TNTC RBC, WBC, protein, glucose, Billirubin- culture no growth Renal ultrasound is negative for obstruction/stone Baseline creatinine likely 0.89 from November 2014.  No other labs available for review  Serum creatinine continues to worsen as patient's blood pressure remains low. UOP poor No acute indication for dialysis at the moment but with worsening renal function, he may require it in near future. Monitor Volume status, electrolytes and S Creatinine  2. Hyponatremia Again likely secondary to renal failure Sodium level is improving slowly  3.  Thrombocytopenia Workup is in progress Likely sepsis related improved  4.  Diabetes type 2 insulin-dependent Poor control Hb1C 10.2 %  5. Sepsis with Klebsiella Pneumonia - antibiotics as per internal medicine  team - Currently getting Cefepime   LOS: 4 Dominic [name] 9/4/20179:43 AM

## 2016-05-06 ENCOUNTER — Encounter
Admission: EM | Disposition: A | Discharge status: Skilled Nursing Facility | Payer: Self-pay | Source: Home / Self Care | Attending: Internal Medicine

## 2016-05-06 HISTORY — PX: PERIPHERAL VASCULAR CATHETERIZATION: SHX172C

## 2016-05-06 LAB — BASIC METABOLIC PANEL
ANION GAP: 8 (ref 5–15)
BUN: 86 mg/dL — ABNORMAL HIGH (ref 6–20)
CHLORIDE: 106 mmol/L (ref 101–111)
CO2: 16 mmol/L — AB (ref 22–32)
Calcium: 8 mg/dL — ABNORMAL LOW (ref 8.9–10.3)
Creatinine, Ser: 4.32 mg/dL — ABNORMAL HIGH (ref 0.61–1.24)
GFR calc non Af Amer: 13 mL/min — ABNORMAL LOW (ref >60)
GFR, EST AFRICAN AMERICAN: 15 mL/min — AB (ref >60)
Glucose, Bld: 126 mg/dL — ABNORMAL HIGH (ref 65–99)
POTASSIUM: 4 mmol/L (ref 3.5–5.1)
Sodium: 130 mmol/L — ABNORMAL LOW (ref 135–145)

## 2016-05-06 LAB — GLUCOSE, CAPILLARY
GLUCOSE-CAPILLARY: 109 mg/dL — AB (ref 65–99)
GLUCOSE-CAPILLARY: 125 mg/dL — AB (ref 65–99)
GLUCOSE-CAPILLARY: 137 mg/dL — AB (ref 65–99)
GLUCOSE-CAPILLARY: 144 mg/dL — AB (ref 65–99)
Glucose-Capillary: 134 mg/dL — ABNORMAL HIGH (ref 65–99)

## 2016-05-06 LAB — CBC
HEMATOCRIT: 26.6 % — AB (ref 40.0–52.0)
HEMOGLOBIN: 8.6 g/dL — AB (ref 13.0–18.0)
MCH: 23.9 pg — ABNORMAL LOW (ref 26.0–34.0)
MCHC: 32.2 g/dL (ref 32.0–36.0)
MCV: 74.3 fL — ABNORMAL LOW (ref 80.0–100.0)
Platelets: 259 10*3/uL (ref 150–440)
RBC: 3.59 MIL/uL — ABNORMAL LOW (ref 4.40–5.90)
RDW: 14.8 % — ABNORMAL HIGH (ref 11.5–14.5)
WBC: 15.3 10*3/uL — AB (ref 3.8–10.6)

## 2016-05-06 SURGERY — DIALYSIS/PERMA CATHETER INSERTION

## 2016-05-06 MED ORDER — LORAZEPAM 2 MG/ML IJ SOLN: 1.0000 mg | Freq: Four times a day (QID) | INTRAMUSCULAR | Status: DC | PRN

## 2016-05-06 MED ORDER — ADULT MULTIVITAMIN W/MINERALS CH
1.0000 | ORAL_TABLET | Freq: Every day | ORAL | Status: DC
  Administered 2016-05-06 – 2016-05-12 (×7): 1 via ORAL
  Filled 2016-05-06 (×7): qty 1

## 2016-05-06 MED ORDER — LORAZEPAM 2 MG PO TABS: 0.0000 mg | ORAL_TABLET | Freq: Two times a day (BID) | ORAL | Status: DC

## 2016-05-06 MED ORDER — LORAZEPAM 1 MG PO TABS: 1.0000 mg | ORAL_TABLET | Freq: Four times a day (QID) | ORAL | Status: DC | PRN

## 2016-05-06 MED ORDER — FOLIC ACID 1 MG PO TABS
1.0000 mg | ORAL_TABLET | Freq: Every day | ORAL | Status: DC
  Administered 2016-05-06 – 2016-05-12 (×7): 1 mg via ORAL
  Filled 2016-05-06 (×7): qty 1

## 2016-05-06 MED ORDER — LORAZEPAM 2 MG PO TABS: 0.0000 mg | ORAL_TABLET | Freq: Four times a day (QID) | ORAL | Status: DC

## 2016-05-06 MED ORDER — FAMOTIDINE 20 MG PO TABS
20.0000 mg | ORAL_TABLET | Freq: Two times a day (BID) | ORAL | Status: DC
  Administered 2016-05-06 – 2016-05-09 (×6): 20 mg via ORAL
  Filled 2016-05-06 (×6): qty 1

## 2016-05-06 MED ORDER — THIAMINE HCL 100 MG/ML IJ SOLN: 100.0000 mg | Freq: Every day | INTRAMUSCULAR | Status: DC

## 2016-05-06 MED ORDER — VITAMIN B-1 100 MG PO TABS
100.0000 mg | ORAL_TABLET | Freq: Every day | ORAL | Status: DC
  Administered 2016-05-06 – 2016-05-12 (×7): 100 mg via ORAL
  Filled 2016-05-06 (×7): qty 1

## 2016-05-06 SURGICAL SUPPLY — 4 items
BIOPATCH RED 1 DISK 7.0 (GAUZE/BANDAGES/DRESSINGS) ×2 IMPLANT
BIOPATCH RED 1IN DISK 7.0MM (GAUZE/BANDAGES/DRESSINGS) ×1
GUIDEWIRE AMPLATZ SHORT (WIRE) ×3 IMPLANT
KIT DIALYSIS CATH TRI 30X13 (CATHETERS) ×3 IMPLANT

## 2016-05-06 NOTE — Clinical Social Work Note (Signed)
Clinical Social Work Assessment  Patient Details  Name: Dominic [name] MRN: [id number] Date of Birth: [birth year]  Date of referral:  05/06/16               Reason for consult:  Facility Placement                Permission sought to share information with:  Family Supports, Oceanographeracility Contact Representative Permission granted to share information::  Yes, Verbal Permission Granted  Name::        Agency::     Relationship::     Contact Information:     Housing/Transportation Living arrangements for the past 2 months:  Single Family Home Source of Information:  Patient (Sister of patient) Patient Interpreter Needed:  None Criminal Activity/Legal Involvement Pertinent to Current Situation/Hospitalization:  No - Comment as needed Significant Relationships:  Siblings Lives with:  Self Do you feel safe going back to the place where you live?   (he would not comment) Need for family participation in patient care:   (he would not comment)  Care giving concerns:  Patient lives alone and requires much assistance with adl's currently.   Social Worker assessment / plan:  CSW informed by physical therapy that they are recommending STR for patient. CSW attempted to speak with patient but he would not look at me nor would he answer most of my questions. He would reluctantly place his finger over his trach when he felt like responding and answered in one word answers mostly but occasionally would respond in partial sentences. Patient's sister was in his room and when CSW asked if he would consider STR he stated "my sister is over there, talk to her."  Patient's sister clarified with CSW what the question for patient was and CSW explained. Patient's sister stated that he did need rehab because he currently could not care for himself. After asking patient multiple times if he would consider STR he eventually replied "yeah" to his sister. CSW will begin bedsearch. Sister stated she did not want [facility]. CSW explained that few facilities [place] would work with trachs and that [facility] was one of them and might be the only one. CSW explained to patient and sister that they would need to think about what they wished to do if that was the only offer.  Employment status:   (unknown) Insurance information:  Managed Medicare PT Recommendations:  Skilled Nursing Facility Information / Referral to community resources:  Skilled Nursing Facility  Patient/Family's Response to care:  Patient extremely avoidant and would not make eye contact with CSW. Patient's sister willing to speak with CSW.  Patient/Family's Understanding of and Emotional Response to Diagnosis, Current Treatment, and Prognosis:  Unclear if patient is depressed due to patient's level of dispondency. CSW will continue to follow and monitor patient's mood.  Emotional Assessment Appearance:  Appears stated age Attitude/Demeanor/Rapport:  Avoidant, Hostile Affect (typically observed):  Flat, Irritable Orientation:   (not clear due to lack of willingness to participate in assessment) Alcohol / Substance use:  Not Applicable Psych involvement (Current and /or in the community):  No (Comment)  Discharge Needs  Concerns to be addressed:  Care Coordination Readmission within the last 30 days:  No Current discharge risk:  None Barriers to Discharge:  No Barriers Identified   [name], LCSW 05/06/2016, 3:34 PM

## 2016-05-06 NOTE — Progress Notes (Signed)
05/06/2016  Pt refuses condom catheter despite order from MD.  Bradly Chrisougherty, Jamiee Milholland E, RN

## 2016-05-06 NOTE — Progress Notes (Signed)
  End of hd 

## 2016-05-06 NOTE — Progress Notes (Signed)
Key Points: Use following P&T approved IV to PO antibiotic change policy.  Description contains the criteria that are approved Note: Policy Excludes:  Esophagectomy patientsPHARMACIST - PHYSICIAN COMMUNICATION DR:   Renae GlossWieting CONCERNING: IV to Oral Route Change Policy  RECOMMENDATION: This patient is receiving famotidine by the intravenous route.  Based on criteria approved by the Pharmacy and Therapeutics Committee, the intravenous medication(s) is/are being converted to the equivalent oral dose form(s).   DESCRIPTION: These criteria include:  The patient is eating (either orally or via tube) and/or has been taking other orally administered medications for a least 24 hours  The patient has no evidence of active gastrointestinal bleeding or impaired GI absorption (gastrectomy, short bowel, patient on TNA or NPO).  If you have questions about this conversion, please contact the Pharmacy Department  []   [phone]( [phone] )  [facility] [x]   [phone]( [phone] )  [facility] []   [phone]( [phone] )  [facility] []   [phone]( [phone] )  [facility] []   [phone]( [phone] )  [facility]   Olene FlossMelissa D Colby Reels, [facility] Hospital[facility]PH 05/06/2016 2:13 PM

## 2016-05-06 NOTE — Evaluation (Signed)
Physical Therapy Evaluation Patient Details Name: Dominic [name] MRN: [id number] DOB: May 04, 1946 Today's Date: 05/06/2016   History of Present Illness  Pt admitted for sepsis. Pt with complaints of weakness and multiple falls at home. Pt with history of CRF s/p trach, HTN, DM, and is legally blind in L eye. Pt with no home O2 at home and is currently on room air. Pt with complicated hospital stay secondary to hypotension, and bacteremia and admitted to CCU on 9/2.   Clinical Impression  Pt is a pleasant 70 year old male who was admitted for sepsis. Pt performs bed mobility with mod assist and refused further transfers/ambualtion at this time. Once seated at EOB, pt able to maintain balance, however does require assist to get to EOB.  Pt demonstrates deficits with strength/mobility/balance. Pt reports he is a limited household ambulator at baseline. Would benefit from skilled PT to address above deficits and promote optimal return to PLOF; recommend transition to STR upon discharge from acute hospitalization.      Follow Up Recommendations SNF    Equipment Recommendations  Rolling walker with 5" wheels    Recommendations for Other Services       Precautions / Restrictions Precautions Precautions: Fall Restrictions Weight Bearing Restrictions: No      Mobility  Bed Mobility Overal bed mobility: Needs Assistance Bed Mobility: Supine to Sit     Supine to sit: Mod assist     General bed mobility comments: assist for sliding B LE off bed and trunk support for sitting at EOB. Once seated at EOB, pt able to sit with supervision. Pt very hesitant to participate in therapy.  Transfers                 General transfer comment: Pt refused further transfers this date, not performed  Ambulation/Gait                Stairs            Wheelchair Mobility    Modified Rankin (Stroke Patients Only)       Balance Overall balance assessment: History of Falls;Needs  assistance Sitting-balance support: Feet supported Sitting balance-Leahy Scale: Fair                                       Pertinent Vitals/Pain      Home Living Family/patient expects to be discharged to:: Private residence Living Arrangements: Alone Available Help at Discharge: Personal care attendant (all day and is alone at night-wants more care) Type of Home: House Home Access: Stairs to enter Entrance Stairs-Rails: None Entrance Stairs-Number of Steps: 1 Home Layout: One level Home Equipment: Environmental consultant - standard;Electric scooter      Prior Function Level of Independence: Needs assistance         Comments: Pt very vague with PLOF, however reports caregivers assist with mobility     Hand Dominance        Extremity/Trunk Assessment   Upper Extremity Assessment: Generalized weakness (B UE grossly 4/5)           Lower Extremity Assessment: Generalized weakness (B LE grossly 3+/5)         Communication   Communication: No difficulties  Cognition Arousal/Alertness: Lethargic Behavior During Therapy: WFL for tasks assessed/performed Overall Cognitive Status: Within Functional Limits for tasks assessed  General Comments      Exercises        Assessment/Plan    PT Assessment Patient needs continued PT services  PT Diagnosis Difficulty walking;Generalized weakness   PT Problem List Decreased strength;Decreased balance;Decreased mobility  PT Treatment Interventions DME instruction;Gait training;Therapeutic exercise   PT Goals (Current goals can be found in the Care Plan section) Acute Rehab PT Goals Patient Stated Goal: to get stronger PT Goal Formulation: With patient Time For Goal Achievement: 05/20/16 Potential to Achieve Goals: Good    Frequency Min 2X/week   Barriers to discharge Decreased caregiver support      Co-evaluation               End of Session   Activity Tolerance: Patient  limited by fatigue Patient left: in bed;with bed alarm set Nurse Communication: Mobility status         Time: [id number] PT Time Calculation (min) (ACUTE ONLY): 14 min   Charges:   PT Evaluation $PT Eval Low Complexity: 1 Procedure     PT G Codes:        Dominic [name] 05/06/2016, 11:38 AM  Dominic PalauStephanie Teona [name], PT, DPT [phone]

## 2016-05-06 NOTE — Progress Notes (Signed)
Subjective:  Renal function remains quite poor. Creatinine currently 4.3 with a BUN of 86. Urine output was 400 cc over the preceding 24 hours. Discussed the need for renal replacement therapy with the patient today. He is okay to proceed.    Objective:  Vital signs in last 24 hours:  Temp:  [97.1 F (36.2 C)-98.7 F (37.1 C)] 97.1 F (36.2 C) (09/05 0537) Pulse Rate:  [52-65] 52 (09/05 0524) Resp:  [13-23] 19 (09/05 0524) BP: (95-127)/(49-67) 127/64 (09/05 0524) SpO2:  [83 %-99 %] 98 % (09/05 0524)  Weight change:  Filed Weights   05/01/16 1451  Weight: 104.3 kg (230 lb)    Intake/Output:    Intake/Output Summary (Last 24 hours) at 05/06/16 1028 Last data filed at 05/06/16 0900  Gross per 24 hour  Intake              828 ml  Output              400 ml  Net              428 ml     Physical Exam: General: No acute distress, resting in bed  HEENT Left corneal opacity  Neck Tracheostomy in place with drainage  Pulm/lungs Coarse breath sounds bilaterally  CVS/Heart S1S2 no rubs  Abdomen:  Soft, mildly distended, nontender  Extremities: trace peripheral edema  Neurologic: Alert, awake, able to communicate  Skin: No acute rashes          Basic Metabolic Panel:   Recent Labs Lab 05/02/16 0519 05/03/16 0628 05/03/16 1607 05/04/16 0349 05/05/16 0621 05/06/16 0456  NA 124* 126*  --  129* 130* 130*  K 4.0 4.1  --  3.9 3.9 4.0  CL 90* 97*  --  101 103 106  CO2 23 19*  --  18* 17* 16*  GLUCOSE 333* 179*  --  134* 134* 126*  BUN 60* 76*  --  83* 87* 86*  CREATININE 3.52* 3.96*  --  4.21* 4.56* 4.32*  CALCIUM 8.6* 8.2*  --  7.8* 8.1* 8.0*  MG  --   --  2.1 2.0  --   --   PHOS  --   --  2.4* 2.6  2.7  --   --      CBC:  Recent Labs Lab 05/01/16 1454 05/02/16 0519 05/04/16 0349 05/05/16 0621 05/06/16 0456  WBC 23.7* 17.8* 11.2* 12.5* 15.3*  HGB 11.1* 10.9* 10.5* 9.6* 8.6*  HCT 34.1* 33.0* 31.8* 29.4* 26.6*  MCV 73.0* 71.0* 73.0* 72.0* 74.3*   PLT 82* 79* 149* 225 259      Microbiology:  Recent Results (from the past 720 hour(s))  CULTURE, BLOOD (ROUTINE X 2) w Reflex to ID Panel     Status: Abnormal   Collection Time: 05/01/16  4:24 PM  Result Value Ref Range Status   Specimen Description BLOOD RIGHT ANTECUBITAL  Final   Special Requests   Final    BOTTLES DRAWN AEROBIC AND ANAEROBIC  AER 6 ML ANA 3 ML   Culture  Setup Time   Final    GRAM NEGATIVE RODS IN BOTH AEROBIC AND ANAEROBIC BOTTLES CRITICAL RESULT CALLED TO, READ BACK BY AND VERIFIED WITH: CHRISTINE KATSOUDAS ON 05/02/16 AT 0744 BY QSD Performed at [facility]    Culture KLEBSIELLA PNEUMONIAE (A)  Final   Report Status 05/04/2016 FINAL  Final   Organism ID, Bacteria KLEBSIELLA PNEUMONIAE  Final      Susceptibility  Klebsiella pneumoniae - MIC*    AMPICILLIN >=32 RESISTANT Resistant     CEFAZOLIN <=4 SENSITIVE Sensitive     CEFEPIME <=1 SENSITIVE Sensitive     CEFTAZIDIME <=1 SENSITIVE Sensitive     CEFTRIAXONE <=1 SENSITIVE Sensitive     CIPROFLOXACIN <=0.25 SENSITIVE Sensitive     GENTAMICIN <=1 SENSITIVE Sensitive     IMIPENEM <=0.25 SENSITIVE Sensitive     TRIMETH/SULFA <=20 SENSITIVE Sensitive     AMPICILLIN/SULBACTAM 4 SENSITIVE Sensitive     PIP/TAZO 16 SENSITIVE Sensitive     Extended ESBL NEGATIVE Sensitive     * KLEBSIELLA PNEUMONIAE  CULTURE, BLOOD (ROUTINE X 2) w Reflex to ID Panel     Status: Abnormal   Collection Time: 05/01/16  4:24 PM  Result Value Ref Range Status   Specimen Description BLOOD LEFT ANTECUBITAL  Final   Special Requests   Final    BOTTLES DRAWN AEROBIC AND ANAEROBIC  AER 6 ML ANA 6 ML   Culture  Setup Time   Final    GRAM NEGATIVE RODS IN BOTH AEROBIC AND ANAEROBIC BOTTLES CRITICAL VALUE NOTED.  VALUE IS CONSISTENT WITH PREVIOUSLY REPORTED AND CALLED VALUE.    Culture (A)  Final    KLEBSIELLA PNEUMONIAE SUSCEPTIBILITIES PERFORMED ON PREVIOUS CULTURE WITHIN THE LAST 5 DAYS. Performed at [facility]    Report Status 05/04/2016 FINAL  Final  Blood Culture ID Panel (Reflexed)     Status: Abnormal   Collection Time: 05/01/16  4:24 PM  Result Value Ref Range Status   Enterococcus species NOT DETECTED NOT DETECTED Final   Listeria monocytogenes NOT DETECTED NOT DETECTED Final   Staphylococcus species NOT DETECTED NOT DETECTED Final   Staphylococcus aureus NOT DETECTED NOT DETECTED Final   Streptococcus species NOT DETECTED NOT DETECTED Final   Streptococcus agalactiae NOT DETECTED NOT DETECTED Final   Streptococcus pneumoniae NOT DETECTED NOT DETECTED Final   Streptococcus pyogenes NOT DETECTED NOT DETECTED Final   Acinetobacter baumannii NOT DETECTED NOT DETECTED Final   Enterobacteriaceae species DETECTED (A) NOT DETECTED Final    Comment: CRITICAL RESULT CALLED TO, READ BACK BY AND VERIFIED WITH: CHRISTINE KATSOUDAS ON 05/02/16 AT 0744 QSD    Enterobacter cloacae complex NOT DETECTED NOT DETECTED Final   Escherichia coli NOT DETECTED NOT DETECTED Final   Klebsiella oxytoca NOT DETECTED NOT DETECTED Final   Klebsiella pneumoniae DETECTED (A) NOT DETECTED Final    Comment: CRITICAL RESULT CALLED TO, READ BACK BY AND VERIFIED WITH: CHRISTINE KATSOUDAS ON 05/02/16 AT 0744 QSD    Proteus species NOT DETECTED NOT DETECTED Final   Serratia marcescens NOT DETECTED NOT DETECTED Final   Carbapenem resistance NOT DETECTED NOT DETECTED Final   Haemophilus influenzae NOT DETECTED NOT DETECTED Final   Neisseria meningitidis NOT DETECTED NOT DETECTED Final   Pseudomonas aeruginosa NOT DETECTED NOT DETECTED Final   Candida albicans NOT DETECTED NOT DETECTED Final   Candida glabrata NOT DETECTED NOT DETECTED Final   Candida krusei NOT DETECTED NOT DETECTED Final   Candida parapsilosis NOT DETECTED NOT DETECTED Final   Candida tropicalis NOT DETECTED NOT DETECTED Final  Urine culture     Status: None   Collection Time: 05/02/16  1:12 PM  Result Value Ref Range Status   Specimen  Description URINE, RANDOM  Final   Special Requests NONE  Final   Culture NO GROWTH [facility]formed [facility]   Final   Report Status 05/04/2016 FINAL  Final  Culture, expectorated sputum-assessment  Status: None   Collection Time: 05/02/16  1:30 PM  Result Value Ref Range Status   Specimen Description EXPECTORATED SPUTUM  Final   Special Requests Normal  Final   Sputum evaluation THIS SPECIMEN IS ACCEPTABLE FOR SPUTUM CULTURE  Final   Report Status 05/02/2016 FINAL  Final  Culture, respiratory (NON-Expectorated)     Status: None   Collection Time: 05/02/16  1:30 PM  Result Value Ref Range Status   Specimen Description EXPECTORATED SPUTUM  Final   Special Requests Normal Reflexed from [id number]  Final   Gram Stain   Final    MODERATE WBC PRESENT, PREDOMINANTLY PMN MODERATE SQUAMOUS EPITHELIAL CELLS PRESENT ABUNDANT GRAM NEGATIVE RODS FEW GRAM POSITIVE COCCI FEW GRAM POSITIVE RODS Performed at [facility]    Culture MULTIPLE ORGANISMS PRESENT, NONE PREDOMINANT  Final   Report Status 05/04/2016 FINAL  Final  MRSA PCR Screening     Status: None   Collection Time: 05/03/16  4:48 PM  Result Value Ref Range Status   MRSA by PCR NEGATIVE NEGATIVE Final    Comment:        The GeneXpert MRSA Assay (FDA approved for NASAL specimens only), is one component of a comprehensive MRSA colonization surveillance program. It is not intended to diagnose MRSA infection nor to guide or monitor treatment for MRSA infections.     Coagulation Studies:  Recent Labs  05/03/16 1607  LABPROT 13.4  INR 1.02    Urinalysis: No results for input(s): COLORURINE, LABSPEC, PHURINE, GLUCOSEU, HGBUR, BILIRUBINUR, KETONESUR, PROTEINUR, UROBILINOGEN, NITRITE, LEUKOCYTESUR in the last 72 hours.  Invalid input(s): APPERANCEUR    Imaging: No results found.   Medications:   . sodium chloride 40 mL/hr at 05/06/16 0803   . aspirin EC  81 mg Oral Daily  . brimonidine  1 drop Both  Eyes Q12H   And  . timolol  1 drop Both Eyes Q12H  . budesonide (PULMICORT) nebulizer solution  0.25 mg Nebulization BID  . cefTRIAXone (ROCEPHIN)  IV  2 g Intravenous Q24H  . famotidine (PEPCID) IV  20 mg Intravenous Q24H  . folic acid  1 mg Oral Daily  . heparin  5,000 Units Subcutaneous Q8H  . insulin aspart  0-15 Units Subcutaneous TID WC  . insulin aspart  0-5 Units Subcutaneous QHS  . insulin glargine  20 Units Subcutaneous QHS  . ipratropium-albuterol  3 mL Nebulization Q6H  . LORazepam  0-4 mg Oral Q6H   Followed by  . [START ON 05/08/2016] LORazepam  0-4 mg Oral Q12H  . multivitamin with minerals  1 tablet Oral Daily  . sodium chloride flush  3 mL Intravenous Q12H  . thiamine  100 mg Oral Daily   Or  . thiamine  100 mg Intravenous Daily   acetaminophen **OR** [DISCONTINUED] acetaminophen, alum & mag hydroxide-simeth, LORazepam **OR** LORazepam, miconazole nitrate, ondansetron **OR** ondansetron (ZOFRAN) IV, senna-docusate  Assessment/ Plan:  70 y.o.African American male with medical problems of Chronic respiratory failure with chronic  tracheostomy, legally blind in left eye due to glaucoma, hypertension, diabetes, who was admitted to [facility] on 05/01/2016 for evaluation of weakness, increased falls.   1.  Acute renal failure, likely ATN from concurrent sepsis/bacteremia U/a with TNTC RBC, WBC, protein, glucose, Billirubin Renal ultrasound is negative for obstruction/stone Baseline creatinine likely 0.89 from November 2014.  No other labs available for review  - Renal function continues to be poor. Urine output over the preceding 24 hours was 400 cc. At this point in  time we will proceed with a trial of renal replacement therapy which will hopefully be temporary.  We will consult with vascular surgery to place a temperature dialysis catheter.  2. Hyponatremia Sodium low but stable at 130.  This should be corrected with dialysis.  3.  Thrombocytopenia Most likely sepsis  related. Platelet count up to 259.  4.  Diabetes type 2 insulin-dependent Poor control Hb1C 10.2 %  5. Sepsis with Klebsiella Pneumonia - Currently on ceftriaxone 2 g IV daily.   LOS: 5 Wrigley Plasencia 9/5/201710:28 AM

## 2016-05-06 NOTE — Op Note (Signed)
  OPERATIVE NOTE   PROCEDURE: 1. Insertion of temporary dialysis catheter catheter right femoral approach.  PRE-OPERATIVE DIAGNOSIS: Acute renal failure secondary to sepsis, hypotension and rhabdomyolysis  POST-OPERATIVE DIAGNOSIS: Same  SURGEON: Renford DillsGregory G Bri Wakeman M.D.  ANESTHESIA: 1% lidocaine local infiltration  ESTIMATED BLOOD LOSS: Minimal cc  INDICATIONS:   Dominic ChamberJohn H Gravette is a 70 y.o. male who presents with sepsis and hypotension and rhabdomyolysis. These factors have caused his kidneys to fail and he is now undergoing or initiating dialysis and therefore Tepper catheters being placed.  DESCRIPTION: After obtaining full informed written consent, the patient was positioned supine. The right groin was prepped and draped in a sterile fashion. Ultrasound was placed in a sterile sleeve. Ultrasound was utilized to identify the right femoral vein which is noted to be echolucent and compressible indicating patency. Images recorded for the permanent record. Under real-time visualization a Seldinger needle is inserted into the vein and the guidewires advanced without difficulty. Small counterincision was made at the wire insertion site. Dilator is passed over the wire and the temporary dialysis catheter catheter is fed over the wire without difficulty.  All lumens aspirate and flush easily and are packed with heparin saline. Catheter secured to the skin of the right thigh with 2-0 silk. A sterile dressing is applied with Biopatch.  COMPLICATIONS: None  CONDITION: Unchanged  Renford DillsGregory G Tia Hieronymus, M.D.  renovascular. Office:  [phone]

## 2016-05-06 NOTE — Progress Notes (Signed)
Post hd vitals 

## 2016-05-06 NOTE — [place] FL2 (Signed)
MEDICAID FL2 LEVEL OF CARE SCREENING TOOL     IDENTIFICATION  Patient Name: Dominic ChamberJohn H Cabral Birthdate: November 16, 1945 Sex: male Admission Date (Current Location): 05/01/2016  [facility] [place]  [place]  ([id number] [facility]) Facility and Address:  [facility], [address], [place], [place] [address]      Provider Number: [id number]  Attending Physician Name and Address:  Alford Highlandichard Wieting, MD  Relative Name and Phone Number:       Current Level of Care: Hospital Recommended Level of Care: Skilled Nursing Facility Prior Approval Number:    Date Approved/Denied:   PASRR Number:  [id number])  Discharge Plan: SNF    Current Diagnoses: Patient Active Problem List   Diagnosis Date Noted  . Sepsis (HCC) 05/01/2016    Orientation RESPIRATION BLADDER Height & Weight     Self, Time, Situation, Place  Tracheostomy (Old Trach room air at baseline. ) Continent Weight: 230 lb (104.3 kg) Height:  5\' 11"  (180.3 cm)  BEHAVIORAL SYMPTOMS/MOOD NEUROLOGICAL BOWEL NUTRITION STATUS   (none)  (none) Continent Diet (Diet: Heart Healthy/ Carb Modifed )  AMBULATORY STATUS COMMUNICATION OF NEEDS Skin   Extensive Assist Verbally Normal                       Personal Care Assistance Level of Assistance  Bathing, Feeding, Dressing Bathing Assistance: Limited assistance Feeding assistance: Independent Dressing Assistance: Limited assistance     Functional Limitations Info  Sight, Hearing, Speech Sight Info: Impaired Hearing Info: Adequate Speech Info: Adequate    SPECIAL CARE FACTORS FREQUENCY  PT (By licensed PT), OT (By licensed OT)     PT Frequency:  (5) OT Frequency:  (5)            Contractures      Additional Factors Info  Code Status, Allergies, Insulin Sliding Scale Code Status Info:  (Full Code. ) Allergies Info:  (Other, Penicillins)   Insulin Sliding Scale Info:  (NovoLog Insulin)       Current Medications  (05/06/2016):  This is the current hospital active medication list Current Facility-Administered Medications  Medication Dose Route Frequency Provider Last Rate Last Dose  . 0.9 %  sodium chloride infusion   Intravenous Continuous Alford Highlandichard Wieting, MD 40 mL/hr at 05/06/16 0803    . acetaminophen (TYLENOL) tablet 650 mg  650 mg Oral Q6H PRN Enid Baasadhika Kalisetti, MD      . alum & mag hydroxide-simeth (MAALOX/MYLANTA) 200-200-20 MG/5ML suspension 30 mL  30 mL Oral Q6H PRN Alford Highlandichard Wieting, MD      . aspirin EC tablet 81 mg  81 mg Oral Daily Enid Baasadhika Kalisetti, MD   81 mg at 05/06/16 0930  . brimonidine (ALPHAGAN) 0.2 % ophthalmic solution 1 drop  1 drop Both Eyes Q12H Enid Baasadhika Kalisetti, MD   1 drop at 05/06/16 0928   And  . timolol (TIMOPTIC) 0.5 % ophthalmic solution 1 drop  1 drop Both Eyes Q12H Enid Baasadhika Kalisetti, MD   1 drop at 05/06/16 0932  . budesonide (PULMICORT) nebulizer solution 0.25 mg  0.25 mg Nebulization BID Alford Highlandichard Wieting, MD   0.25 mg at 05/06/16 0721  . cefTRIAXone (ROCEPHIN) 2 g in dextrose 5 % 50 mL IVPB  2 g Intravenous Q24H Alford Highlandichard Wieting, MD   2 g at 05/06/16 1148  . famotidine (PEPCID) tablet 20 mg  20 mg Oral BID Alford Highlandichard Wieting, MD      . folic acid (FOLVITE) tablet 1 mg  1 mg Oral  Daily Alexis Hugelmeyer, DO   1 mg at 05/06/16 0928  . heparin injection 5,000 Units  5,000 Units Subcutaneous Q8H Enid Baas, MD   5,000 Units at 05/06/16 1404  . insulin aspart (novoLOG) injection 0-15 Units  0-15 Units Subcutaneous TID WC Merwyn Katos, MD   2 Units at 05/06/16 1148  . insulin aspart (novoLOG) injection 0-5 Units  0-5 Units Subcutaneous QHS Merwyn Katos, MD      . insulin glargine (LANTUS) injection 20 Units  20 Units Subcutaneous QHS Merwyn Katos, MD   20 Units at 05/05/16 2302  . ipratropium-albuterol (DUONEB) 0.5-2.5 (3) MG/3ML nebulizer solution 3 mL  3 mL Nebulization Q6H Alford Highland, MD   3 mL at 05/06/16 1332  . miconazole nitrate (MICATIN) topical powder    Topical PRN Dominic Loron, NP      . multivitamin with minerals tablet 1 tablet  1 tablet Oral Daily Alexis Hugelmeyer, DO   1 tablet at 05/06/16 0928  . ondansetron (ZOFRAN) tablet 4 mg  4 mg Oral Q6H PRN Enid Baas, MD       Or  . ondansetron (ZOFRAN) injection 4 mg  4 mg Intravenous Q6H PRN Enid Baas, MD   4 mg at 05/05/16 0106  . senna-docusate (Senokot-S) tablet 2 tablet  2 tablet Oral QHS PRN Dominic Loron, NP      . sodium chloride flush (NS) 0.9 % injection 3 mL  3 mL Intravenous Q12H Enid Baas, MD   3 mL at 05/06/16 1000  . thiamine (VITAMIN B-1) tablet 100 mg  100 mg Oral Daily Alexis Hugelmeyer, DO   100 mg at 05/06/16 1610   Or  . thiamine (B-1) injection 100 mg  100 mg Intravenous Daily Alexis Hugelmeyer, DO         Discharge Medications: Please see discharge summary for a list of discharge medications.  Relevant Imaging Results:  Relevant Lab Results:   Additional Information  (SSN: [id number] (Dialysis patient) )  Lorien Shingler, Darleen Crocker, LCSW
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
## 2016-05-06 NOTE — Progress Notes (Signed)
Hd start 

## 2016-05-06 NOTE — Progress Notes (Signed)
Pre hd info 

## 2016-05-06 NOTE — Progress Notes (Signed)
Patient ID: Dominic [name], male   DOB: 26-Apr-1946, 70 y.o.   MRN: [id number]  [employer] PROGRESS NOTE  Dominic [name] WUJ:[id number] DOB: 26-Apr-1946 DOA: 05/01/2016 PCP: No primary care provider on file.  HPI/Subjective: Patient stated that we can go ahead with dialysis and catheter placement. Does have some cough and some wheezing.  Objective: Vitals:   05/06/16 0537 05/06/16 1317  BP:  118/64  Pulse:  76  Resp:  18  Temp: 97.1 F (36.2 C) 97.7 F (36.5 C)    Filed Weights   05/01/16 1451  Weight: 104.3 kg (230 lb)    ROS: Review of Systems  Constitutional: Negative for chills and fever.  Eyes: Negative for blurred vision.  Respiratory: Positive for cough and wheezing. Negative for shortness of breath.   Cardiovascular: Negative for chest pain.  Gastrointestinal: Positive for heartburn. Negative for abdominal pain, constipation, diarrhea, nausea and vomiting.  Genitourinary: Negative for dysuria.  Musculoskeletal: Negative for joint pain.  Neurological: Negative for dizziness and headaches.   Exam: Physical Exam  HENT:  Nose: No mucosal edema.  Mouth/Throat: No oropharyngeal exudate or posterior oropharyngeal edema.  Eyes: Conjunctivae and lids are normal.  Left eye clouded over.  Neck: No JVD present. Carotid bruit is not present. No edema present. No thyroid mass and no thyromegaly present.  Cardiovascular: S1 normal and S2 normal.  Exam reveals no gallop.   No murmur heard. Pulses:      Dorsalis pedis pulses are 2+ on the right side, and 2+ on the left side.  Respiratory: No respiratory distress. He has decreased breath sounds in the right lower field and the left lower field. He has wheezes in the right middle field, the right lower field, the left middle field and the left lower field. He has no rhonchi. He has no rales.  GI: Soft. Bowel sounds are normal. There is no tenderness.  Musculoskeletal:       Right ankle: He exhibits swelling.       Left ankle:  He exhibits swelling.  Lymphadenopathy:    He has no cervical adenopathy.  Neurological: He is alert. No cranial nerve deficit.  Skin: Skin is warm. No rash noted. Nails show no clubbing.  Psychiatric: He has a normal mood and affect.      Data Reviewed: Basic Metabolic Panel:  Recent Labs Lab 05/02/16 0519 05/03/16 0628 05/03/16 1607 05/04/16 0349 05/05/16 0621 05/06/16 0456  NA 124* 126*  --  129* 130* 130*  K 4.0 4.1  --  3.9 3.9 4.0  CL 90* 97*  --  101 103 106  CO2 23 19*  --  18* 17* 16*  GLUCOSE 333* 179*  --  134* 134* 126*  BUN 60* 76*  --  83* 87* 86*  CREATININE 3.52* 3.96*  --  4.21* 4.56* 4.32*  CALCIUM 8.6* 8.2*  --  7.8* 8.1* 8.0*  MG  --   --  2.1 2.0  --   --   PHOS  --   --  2.4* 2.6  2.7  --   --    Liver Function Tests:  Recent Labs Lab 05/03/16 1607 05/04/16 0349 05/05/16 0621  AST 34  --  25  ALT 25  --  21  ALKPHOS 328*  --  359*  BILITOT 8.3*  --  4.9*  PROT 6.6  --  6.6  ALBUMIN 2.2* 1.9* 2.0*   CBC:  Recent Labs Lab 05/01/16 1454 05/02/16 0519 05/04/16 0349 05/05/16  1610 05/06/16 0456  WBC 23.7* 17.8* 11.2* 12.5* 15.3*  HGB 11.1* 10.9* 10.5* 9.6* 8.6*  HCT 34.1* 33.0* 31.8* 29.4* 26.6*  MCV 73.0* 71.0* 73.0* 72.0* 74.3*  PLT 82* 79* 149* 225 259   Cardiac Enzymes:  Recent Labs Lab 05/01/16 1454 05/03/16 0628 05/03/16 1607 05/03/16 2128 05/04/16 0349  CKTOTAL 701* 96  --   --  66  TROPONINI  --   --  0.04* 0.06* 0.04*   CBG:  Recent Labs Lab 05/05/16 1226 05/05/16 1622 05/06/16 0648 05/06/16 0721 05/06/16 1134  GLUCAP 145* 119* 125* 134* 137*    Recent Results (from the past 240 hour(s))  CULTURE, BLOOD (ROUTINE X 2) w Reflex to ID Panel     Status: Abnormal   Collection Time: 05/01/16  4:24 PM  Result Value Ref Range Status   Specimen Description BLOOD RIGHT ANTECUBITAL  Final   Special Requests   Final    BOTTLES DRAWN AEROBIC AND ANAEROBIC  AER 6 ML ANA 3 ML   Culture  Setup Time   Final    GRAM  NEGATIVE RODS IN BOTH AEROBIC AND ANAEROBIC BOTTLES CRITICAL RESULT CALLED TO, READ BACK BY AND VERIFIED WITH: CHRISTINE KATSOUDAS ON 05/02/16 AT 0744 BY QSD Performed at [facility]    Culture KLEBSIELLA PNEUMONIAE (A)  Final   Report Status 05/04/2016 FINAL  Final   Organism ID, Bacteria KLEBSIELLA PNEUMONIAE  Final      Susceptibility   Klebsiella pneumoniae - MIC*    AMPICILLIN >=32 RESISTANT Resistant     CEFAZOLIN <=4 SENSITIVE Sensitive     CEFEPIME <=1 SENSITIVE Sensitive     CEFTAZIDIME <=1 SENSITIVE Sensitive     CEFTRIAXONE <=1 SENSITIVE Sensitive     CIPROFLOXACIN <=0.25 SENSITIVE Sensitive     GENTAMICIN <=1 SENSITIVE Sensitive     IMIPENEM <=0.25 SENSITIVE Sensitive     TRIMETH/SULFA <=20 SENSITIVE Sensitive     AMPICILLIN/SULBACTAM 4 SENSITIVE Sensitive     PIP/TAZO 16 SENSITIVE Sensitive     Extended ESBL NEGATIVE Sensitive     * KLEBSIELLA PNEUMONIAE  CULTURE, BLOOD (ROUTINE X 2) w Reflex to ID Panel     Status: Abnormal   Collection Time: 05/01/16  4:24 PM  Result Value Ref Range Status   Specimen Description BLOOD LEFT ANTECUBITAL  Final   Special Requests   Final    BOTTLES DRAWN AEROBIC AND ANAEROBIC  AER 6 ML ANA 6 ML   Culture  Setup Time   Final    GRAM NEGATIVE RODS IN BOTH AEROBIC AND ANAEROBIC BOTTLES CRITICAL VALUE NOTED.  VALUE IS CONSISTENT WITH PREVIOUSLY REPORTED AND CALLED VALUE.    Culture (A)  Final    KLEBSIELLA PNEUMONIAE SUSCEPTIBILITIES PERFORMED ON PREVIOUS CULTURE WITHIN THE LAST 5 DAYS. Performed at [facility]    Report Status 05/04/2016 FINAL  Final  Blood Culture ID Panel (Reflexed)     Status: Abnormal   Collection Time: 05/01/16  4:24 PM  Result Value Ref Range Status   Enterococcus species NOT DETECTED NOT DETECTED Final   Listeria monocytogenes NOT DETECTED NOT DETECTED Final   Staphylococcus species NOT DETECTED NOT DETECTED Final   Staphylococcus aureus NOT DETECTED NOT DETECTED Final   Streptococcus  species NOT DETECTED NOT DETECTED Final   Streptococcus agalactiae NOT DETECTED NOT DETECTED Final   Streptococcus pneumoniae NOT DETECTED NOT DETECTED Final   Streptococcus pyogenes NOT DETECTED NOT DETECTED Final   Acinetobacter baumannii NOT DETECTED NOT DETECTED Final  Enterobacteriaceae species DETECTED (A) NOT DETECTED Final    Comment: CRITICAL RESULT CALLED TO, READ BACK BY AND VERIFIED WITH: CHRISTINE KATSOUDAS ON 05/02/16 AT 0744 QSD    Enterobacter cloacae complex NOT DETECTED NOT DETECTED Final   Escherichia coli NOT DETECTED NOT DETECTED Final   Klebsiella oxytoca NOT DETECTED NOT DETECTED Final   Klebsiella pneumoniae DETECTED (A) NOT DETECTED Final    Comment: CRITICAL RESULT CALLED TO, READ BACK BY AND VERIFIED WITH: CHRISTINE KATSOUDAS ON 05/02/16 AT 0744 QSD    Proteus species NOT DETECTED NOT DETECTED Final   Serratia marcescens NOT DETECTED NOT DETECTED Final   Carbapenem resistance NOT DETECTED NOT DETECTED Final   Haemophilus influenzae NOT DETECTED NOT DETECTED Final   Neisseria meningitidis NOT DETECTED NOT DETECTED Final   Pseudomonas aeruginosa NOT DETECTED NOT DETECTED Final   Candida albicans NOT DETECTED NOT DETECTED Final   Candida glabrata NOT DETECTED NOT DETECTED Final   Candida krusei NOT DETECTED NOT DETECTED Final   Candida parapsilosis NOT DETECTED NOT DETECTED Final   Candida tropicalis NOT DETECTED NOT DETECTED Final  Urine culture     Status: None   Collection Time: 05/02/16  1:12 PM  Result Value Ref Range Status   Specimen Description URINE, RANDOM  Final   Special Requests NONE  Final   Culture NO GROWTH Performed at [facility]   Final   Report Status 05/04/2016 FINAL  Final  Culture, expectorated sputum-assessment     Status: None   Collection Time: 05/02/16  1:30 PM  Result Value Ref Range Status   Specimen Description EXPECTORATED SPUTUM  Final   Special Requests Normal  Final   Sputum evaluation THIS SPECIMEN IS ACCEPTABLE  FOR SPUTUM CULTURE  Final   Report Status 05/02/2016 FINAL  Final  Culture, respiratory (NON-Expectorated)     Status: None   Collection Time: 05/02/16  1:30 PM  Result Value Ref Range Status   Specimen Description EXPECTORATED SPUTUM  Final   Special Requests Normal Reflexed from [id number]  Final   Gram Stain   Final    MODERATE WBC PRESENT, PREDOMINANTLY PMN MODERATE SQUAMOUS EPITHELIAL CELLS PRESENT ABUNDANT GRAM NEGATIVE RODS FEW GRAM POSITIVE COCCI FEW GRAM POSITIVE RODS Performed at [facility]    Culture MULTIPLE ORGANISMS PRESENT, NONE PREDOMINANT  Final   Report Status 05/04/2016 FINAL  Final  MRSA PCR Screening     Status: None   Collection Time: 05/03/16  4:48 PM  Result Value Ref Range Status   MRSA by PCR NEGATIVE NEGATIVE Final    Comment:        The GeneXpert MRSA Assay (FDA approved for NASAL specimens only), is one component of a comprehensive MRSA colonization surveillance program. It is not intended to diagnose MRSA infection nor to guide or monitor treatment for MRSA infections.      Scheduled Meds: . aspirin EC  81 mg Oral Daily  . brimonidine  1 drop Both Eyes Q12H   And  . timolol  1 drop Both Eyes Q12H  . budesonide (PULMICORT) nebulizer solution  0.25 mg Nebulization BID  . cefTRIAXone (ROCEPHIN)  IV  2 g Intravenous Q24H  . famotidine (PEPCID) IV  20 mg Intravenous Q24H  . folic acid  1 mg Oral Daily  . heparin  5,000 Units Subcutaneous Q8H  . insulin aspart  0-15 Units Subcutaneous TID WC  . insulin aspart  0-5 Units Subcutaneous QHS  . insulin glargine  20 Units Subcutaneous QHS  .  ipratropium-albuterol  3 mL Nebulization Q6H  . LORazepam  0-4 mg Oral Q6H   Followed by  . [START ON 05/08/2016] LORazepam  0-4 mg Oral Q12H  . multivitamin with minerals  1 tablet Oral Daily  . sodium chloride flush  3 mL Intravenous Q12H  . thiamine  100 mg Oral Daily   Or  . thiamine  100 mg Intravenous Daily   Continuous Infusions: . sodium  chloride 40 mL/hr at 05/06/16 0803    Assessment/Plan:  1. Severe sepsis with multiorgan failure including shock and acute renal failure. Switch antibiotics back to Rocephin since the Klebsiella is sensitive. 2. Shock resolved. Patient was on pressors for 2 days. 3. Acute kidney injury and hyponatremia. Case discussed with nephrology Nephrology decided to go with temporary dialysis to try to get the kidney function better. The patient said that we can go ahead with this. 4. Chronic thrombocytopenia. Looking back at old labs this is likely secondary to alcohol. 5. Type 2 diabetes uncontrolled. On Lantus and sliding scale. 6. Mild rhabdomyolysis. Simvastatin stopped. CPK trended normal range. 7. Hyperlipidemia unspecified stop simvastatin 8. Weakness. Physical therapy evaluation recommended rehabilitation. 9. Alcohol abuse. No signs of withdrawal.  10. Chronic tracheostomy patient. Continue nebulizer treatments. Oxygenating well on room air. Continue nebulizer treatments  Code Status:     Code Status Orders        Start     Ordered   05/01/16 1757  Full code  Continuous     05/01/16 1756    Code Status History    Date Active Date Inactive Code Status Order ID Comments User Context   05/01/2016  5:56 PM 05/04/2016  5:57 AM Full Code [id number]  Enid Baas, MD Inpatient     Disposition Plan: To be determined  Consultants:  Nephrology  Critical care specialist  Antibiotics:  Rocephin  Time spent: 25 minutes.  Alford Highland  [employer]

## 2016-05-06 NOTE — Progress Notes (Signed)
Pre hd assessment  

## 2016-05-06 NOTE — Progress Notes (Signed)
Post hd assessment 

## 2016-05-07 ENCOUNTER — Encounter: Payer: Self-pay | Admitting: Vascular Surgery

## 2016-05-07 LAB — BASIC METABOLIC PANEL
Anion gap: 7 (ref 5–15)
BUN: 69 mg/dL — AB (ref 6–20)
CALCIUM: 8 mg/dL — AB (ref 8.9–10.3)
CHLORIDE: 106 mmol/L (ref 101–111)
CO2: 19 mmol/L — ABNORMAL LOW (ref 22–32)
CREATININE: 3.49 mg/dL — AB (ref 0.61–1.24)
GFR calc Af Amer: 19 mL/min — ABNORMAL LOW (ref >60)
GFR, EST NON AFRICAN AMERICAN: 16 mL/min — AB (ref >60)
Glucose, Bld: 98 mg/dL (ref 65–99)
Potassium: 3.6 mmol/L (ref 3.5–5.1)
SODIUM: 132 mmol/L — AB (ref 135–145)

## 2016-05-07 LAB — HEPATITIS B CORE ANTIBODY, TOTAL: HEP B C TOTAL AB: NEGATIVE

## 2016-05-07 LAB — GLUCOSE, CAPILLARY
GLUCOSE-CAPILLARY: 81 mg/dL (ref 65–99)
Glucose-Capillary: 120 mg/dL — ABNORMAL HIGH (ref 65–99)
Glucose-Capillary: 102 mg/dL — ABNORMAL HIGH (ref 65–99)
Glucose-Capillary: 78 mg/dL (ref 65–99)

## 2016-05-07 LAB — HEPATITIS B SURFACE ANTIGEN: HEP B S AG: NEGATIVE

## 2016-05-07 LAB — PHOSPHORUS: Phosphorus: 2.4 mg/dL — ABNORMAL LOW (ref 2.5–4.6)

## 2016-05-07 LAB — HEPATITIS B SURFACE ANTIBODY, QUANTITATIVE

## 2016-05-07 MED ORDER — ALTEPLASE 2 MG IJ SOLR: 2.0000 mg | Freq: Once | INTRAMUSCULAR | Status: DC | PRN

## 2016-05-07 MED ORDER — SODIUM CHLORIDE 0.9 % IV SOLN: 100.0000 mL | INTRAVENOUS | Status: DC | PRN

## 2016-05-07 MED ORDER — LIDOCAINE-PRILOCAINE 2.5-2.5 % EX CREA
1.0000 "application " | TOPICAL_CREAM | CUTANEOUS | Status: DC | PRN
  Filled 2016-05-07: qty 5

## 2016-05-07 MED ORDER — HEPARIN SODIUM (PORCINE) 1000 UNIT/ML DIALYSIS
1000.0000 [IU] | INTRAMUSCULAR | Status: DC | PRN
  Filled 2016-05-07: qty 1

## 2016-05-07 MED ORDER — PENTAFLUOROPROP-TETRAFLUOROETH EX AERO
1.0000 "application " | INHALATION_SPRAY | CUTANEOUS | Status: DC | PRN
  Filled 2016-05-07: qty 30

## 2016-05-07 MED ORDER — LIDOCAINE HCL (PF) 1 % IJ SOLN
5.0000 mL | INTRAMUSCULAR | Status: DC | PRN
  Filled 2016-05-07: qty 5

## 2016-05-07 NOTE — Care Management (Signed)
Patient with temporary HD cath.  Discussed case with Dr. Cherylann RatelLateef nephrology.  At this time plan for HD acutely while in hospital, and not indicated at this time to arrange outpatient HD.

## 2016-05-07 NOTE — Progress Notes (Signed)
Subjective:  atient had first hemodialysis treatment yesterday. He is due for another dialysis treatment today. Urine output remains poor.   Objective:  Vital signs in last 24 hours:  Temp:  [97.4 F (36.3 C)-97.9 F (36.6 C)] 97.4 F (36.3 C) (09/06 1324) Pulse Rate:  [55-92] 60 (09/06 1400) Resp:  [14-24] 14 (09/06 1400) BP: (90-151)/(40-119) 121/60 (09/06 1400) SpO2:  [86 %-100 %] 100 % (09/06 1400) FiO2 (%):  [28 %] 28 % (09/06 0800) Weight:  [107 kg (235 lb 14.3 oz)-108.9 kg (240 lb 1.3 oz)] 108.9 kg (240 lb 1.3 oz) (09/06 1324)  Weight change:  Filed Weights   05/06/16 1915 05/06/16 2050 05/07/16 1324  Weight: 107.1 kg (236 lb 1.8 oz) 107 kg (235 lb 14.3 oz) 108.9 kg (240 lb 1.3 oz)    Intake/Output:    Intake/Output Summary (Last 24 hours) at 05/07/16 1417 Last data filed at 05/07/16 0900  Gross per 24 hour  Intake           818.42 ml  Output              250 ml  Net           568.42 ml     Physical Exam: General: No acute distress, resting in bed  HEENT Left corneal opacity  Neck Tracheostomy in place with drainage  Pulm/lungs Coarse breath sounds bilaterally  CVS/Heart S1S2 no rubs  Abdomen:  Soft, mildly distended, nontender  Extremities: trace peripheral edema  Neurologic: Alert, awake, able to communicate  Skin: No acute rashes          Basic Metabolic Panel:   Recent Labs Lab 05/03/16 0628 05/03/16 1607 05/04/16 0349 05/05/16 0621 05/06/16 0456 05/07/16 0437  NA 126*  --  129* 130* 130* 132*  K 4.1  --  3.9 3.9 4.0 3.6  CL 97*  --  101 103 106 106  CO2 19*  --  18* 17* 16* 19*  GLUCOSE 179*  --  134* 134* 126* 98  BUN 76*  --  83* 87* 86* 69*  CREATININE 3.96*  --  4.21* 4.56* 4.32* 3.49*  CALCIUM 8.2*  --  7.8* 8.1* 8.0* 8.0*  MG  --  2.1 2.0  --   --   --   PHOS  --  2.4* 2.6  2.7  --   --   --      CBC:  Recent Labs Lab 05/01/16 1454 05/02/16 0519 05/04/16 0349 05/05/16 0621 05/06/16 0456  WBC 23.7* 17.8* 11.2*  12.5* 15.3*  HGB 11.1* 10.9* 10.5* 9.6* 8.6*  HCT 34.1* 33.0* 31.8* 29.4* 26.6*  MCV 73.0* 71.0* 73.0* 72.0* 74.3*  PLT 82* 79* 149* 225 259      Microbiology:  Recent Results (from the past 720 hour(s))  CULTURE, BLOOD (ROUTINE X 2) w Reflex to ID Panel     Status: Abnormal   Collection Time: 05/01/16  4:24 PM  Result Value Ref Range Status   Specimen Description BLOOD RIGHT ANTECUBITAL  Final   Special Requests   Final    BOTTLES DRAWN AEROBIC AND ANAEROBIC  AER 6 ML ANA 3 ML   Culture  Setup Time   Final    GRAM NEGATIVE RODS IN BOTH AEROBIC AND ANAEROBIC BOTTLES CRITICAL RESULT CALLED TO, READ BACK BY AND VERIFIED WITH: CHRISTINE KATSOUDAS ON 05/02/16 AT 0744 BY QSD Performed at [facility]    Culture KLEBSIELLA PNEUMONIAE (A)  Final   Report Status 05/04/2016  FINAL  Final   Organism ID, Bacteria KLEBSIELLA PNEUMONIAE  Final      Susceptibility   Klebsiella pneumoniae - MIC*    AMPICILLIN >=32 RESISTANT Resistant     CEFAZOLIN <=4 SENSITIVE Sensitive     CEFEPIME <=1 SENSITIVE Sensitive     CEFTAZIDIME <=1 SENSITIVE Sensitive     CEFTRIAXONE <=1 SENSITIVE Sensitive     CIPROFLOXACIN <=0.25 SENSITIVE Sensitive     GENTAMICIN <=1 SENSITIVE Sensitive     IMIPENEM <=0.25 SENSITIVE Sensitive     TRIMETH/SULFA <=20 SENSITIVE Sensitive     AMPICILLIN/SULBACTAM 4 SENSITIVE Sensitive     PIP/TAZO 16 SENSITIVE Sensitive     Extended ESBL NEGATIVE Sensitive     * KLEBSIELLA PNEUMONIAE  CULTURE, BLOOD (ROUTINE X 2) w Reflex to ID Panel     Status: Abnormal   Collection Time: 05/01/16  4:24 PM  Result Value Ref Range Status   Specimen Description BLOOD LEFT ANTECUBITAL  Final   Special Requests   Final    BOTTLES DRAWN AEROBIC AND ANAEROBIC  AER 6 ML ANA 6 ML   Culture  Setup Time   Final    GRAM NEGATIVE RODS IN BOTH AEROBIC AND ANAEROBIC BOTTLES CRITICAL VALUE NOTED.  VALUE IS CONSISTENT WITH PREVIOUSLY REPORTED AND CALLED VALUE.    Culture (A)  Final     KLEBSIELLA PNEUMONIAE SUSCEPTIBILITIES PERFORMED ON PREVIOUS CULTURE WITHIN THE LAST 5 DAYS. Performed at [facility]    Report Status 05/04/2016 FINAL  Final  Blood Culture ID Panel (Reflexed)     Status: Abnormal   Collection Time: 05/01/16  4:24 PM  Result Value Ref Range Status   Enterococcus species NOT DETECTED NOT DETECTED Final   Listeria monocytogenes NOT DETECTED NOT DETECTED Final   Staphylococcus species NOT DETECTED NOT DETECTED Final   Staphylococcus aureus NOT DETECTED NOT DETECTED Final   Streptococcus species NOT DETECTED NOT DETECTED Final   Streptococcus agalactiae NOT DETECTED NOT DETECTED Final   Streptococcus pneumoniae NOT DETECTED NOT DETECTED Final   Streptococcus pyogenes NOT DETECTED NOT DETECTED Final   Acinetobacter baumannii NOT DETECTED NOT DETECTED Final   Enterobacteriaceae species DETECTED (A) NOT DETECTED Final    Comment: CRITICAL RESULT CALLED TO, READ BACK BY AND VERIFIED WITH: CHRISTINE KATSOUDAS ON 05/02/16 AT 0744 QSD    Enterobacter cloacae complex NOT DETECTED NOT DETECTED Final   Escherichia coli NOT DETECTED NOT DETECTED Final   Klebsiella oxytoca NOT DETECTED NOT DETECTED Final   Klebsiella pneumoniae DETECTED (A) NOT DETECTED Final    Comment: CRITICAL RESULT CALLED TO, READ BACK BY AND VERIFIED WITH: CHRISTINE KATSOUDAS ON 05/02/16 AT 0744 QSD    Proteus species NOT DETECTED NOT DETECTED Final   Serratia marcescens NOT DETECTED NOT DETECTED Final   Carbapenem resistance NOT DETECTED NOT DETECTED Final   Haemophilus influenzae NOT DETECTED NOT DETECTED Final   Neisseria meningitidis NOT DETECTED NOT DETECTED Final   Pseudomonas aeruginosa NOT DETECTED NOT DETECTED Final   Candida albicans NOT DETECTED NOT DETECTED Final   Candida glabrata NOT DETECTED NOT DETECTED Final   Candida krusei NOT DETECTED NOT DETECTED Final   Candida parapsilosis NOT DETECTED NOT DETECTED Final   Candida tropicalis NOT DETECTED NOT DETECTED Final   Urine culture     Status: None   Collection Time: 05/02/16  1:12 PM  Result Value Ref Range Status   Specimen Description URINE, RANDOM  Final   Special Requests NONE  Final   Culture NO GROWTH [facility]formed  [facility]   Final   Report Status 05/04/2016 FINAL  Final  Culture, expectorated sputum-assessment     Status: None   Collection Time: 05/02/16  1:30 PM  Result Value Ref Range Status   Specimen Description EXPECTORATED SPUTUM  Final   Special Requests Normal  Final   Sputum evaluation THIS SPECIMEN IS ACCEPTABLE FOR SPUTUM CULTURE  Final   Report Status 05/02/2016 FINAL  Final  Culture, respiratory (NON-Expectorated)     Status: None   Collection Time: 05/02/16  1:30 PM  Result Value Ref Range Status   Specimen Description EXPECTORATED SPUTUM  Final   Special Requests Normal Reflexed from [id number]  Final   Gram Stain   Final    MODERATE WBC PRESENT, PREDOMINANTLY PMN MODERATE SQUAMOUS EPITHELIAL CELLS PRESENT ABUNDANT GRAM NEGATIVE RODS FEW GRAM POSITIVE COCCI FEW GRAM POSITIVE RODS [facility]formed [facility]    Culture MULTIPLE ORGANISMS PRESENT, NONE PREDOMINANT  Final   Report Status 05/04/2016 FINAL  Final  MRSA PCR Screening     Status: None   Collection Time: 05/03/16  4:48 PM  Result Value Ref Range Status   MRSA by PCR NEGATIVE NEGATIVE Final    Comment:        The GeneXpert MRSA Assay (FDA approved for NASAL specimens only), is one component of a comprehensive MRSA colonization surveillance program. It is not intended to diagnose MRSA infection nor to guide or monitor treatment for MRSA infections.     Coagulation Studies: No results for input(s): LABPROT, INR in the last 72 hours.  Urinalysis: No results for input(s): COLORURINE, LABSPEC, PHURINE, GLUCOSEU, HGBUR, BILIRUBINUR, KETONESUR, PROTEINUR, UROBILINOGEN, NITRITE, LEUKOCYTESUR in the last 72 hours.  Invalid input(s): APPERANCEUR    Imaging: No results  found.   Medications:   . sodium chloride 40 mL/hr at 05/06/16 0803   . aspirin EC  81 mg Oral Daily  . brimonidine  1 drop Both Eyes Q12H   And  . timolol  1 drop Both Eyes Q12H  . budesonide (PULMICORT) nebulizer solution  0.25 mg Nebulization BID  . cefTRIAXone (ROCEPHIN)  IV  2 g Intravenous Q24H  . famotidine  20 mg Oral BID  . folic acid  1 mg Oral Daily  . heparin  5,000 Units Subcutaneous Q8H  . insulin aspart  0-15 Units Subcutaneous TID WC  . insulin aspart  0-5 Units Subcutaneous QHS  . insulin glargine  20 Units Subcutaneous QHS  . ipratropium-albuterol  3 mL Nebulization Q6H  . multivitamin with minerals  1 tablet Oral Daily  . sodium chloride flush  3 mL Intravenous Q12H  . thiamine  100 mg Oral Daily   Or  . thiamine  100 mg Intravenous Daily   acetaminophen **OR** [DISCONTINUED] acetaminophen, alum & mag hydroxide-simeth, miconazole nitrate, ondansetron **OR** ondansetron (ZOFRAN) IV, senna-docusate  Assessment/ Plan:  70 y.o.African American male with medical problems of Chronic respiratory failure with chronic  tracheostomy, legally blind in left eye due to glaucoma, hypertension, diabetes, who [facility] for evaluation of weakness, increased falls.   1.  Acute renal failure, likely ATN from concurrent sepsis/bacteremia U/a with TNTC RBC, WBC, protein, glucose, Billirubin Renal ultrasound is negative for obstruction/stone Baseline creatinine likely 0.89 from November 2014.  No other labs available for review  - renal function remains poor at this moment.  Patient due for hemodialysis again today.  We will reassess the patient for dialysis tomorrow as well.  2. Hyponatremia  Serum sodium improved to 132.  Continue to monitor.  3.  Thrombocytopenia Most likely sepsis related. Platelet count up to 259.  4.  Diabetes type 2 insulin-dependent Poor control Hb1C 10.2 %  5. Sepsis with Klebsiella Pneumonia - continue ceftriaxone 2  g IV daily.   LOS: 6 Ayodele Sangalang 9/6/20172:17 PM

## 2016-05-07 NOTE — Progress Notes (Signed)
Post hd assessment 

## 2016-05-07 NOTE — Progress Notes (Signed)
Pre hd assessment  

## 2016-05-07 NOTE — Therapy (Signed)
Patient found on routine rounds to be on room air/no humidity to trach. Congested, upper airway, suctioned large amount of tenacious pale yellow secretions. Inner cannula changed, nearly occluded with secretions. Neb tx given, trach care done. Aerosol t-collar at .28 set up. Patient educated on the need for appropriate humidity and the possibility of occluding his airway. Patient reluctantly agreed to wear his t-collar. Patient is requiring frequent suctioning.

## 2016-05-07 NOTE — Progress Notes (Signed)
Post hd vitals 

## 2016-05-07 NOTE — Progress Notes (Signed)
Start of hd 

## 2016-05-07 NOTE — Progress Notes (Signed)
Physical Therapy Treatment Patient Details Name: Dominic [name] MRN: [id number] DOB: 15-Dec-1945 Today's Date: 05/07/2016    History of Present Illness Pt admitted for sepsis. Pt with complaints of weakness and multiple falls at home. Pt with history of CRF s/p trach, HTN, DM, and is legally blind in L eye. Pt with no home O2 at home and is currently on room air. Pt with complicated hospital stay secondary to hypotension, and bacteremia and admitted to CCU on 9/2.     PT Comments    Pt lethargic, but agreeable to PT for bed exercises only. Pt performs limited supine bed exercises before fatigue. Pt wishes repositioned upward in bed and requires heavy cueing and assist to grab rails and position Bilateral lower extremities for use to self assist movement upward; pt requires Mod A and fatigues further refusing any further PT. Pt maintaining humidity over tracheostomy. Continue PT to progress strength and activity tolerance for improved functional mobility.  Follow Up Recommendations  SNF     Equipment Recommendations  Rolling walker with 5" wheels    Recommendations for Other Services       Precautions / Restrictions Precautions Precautions: Fall Restrictions Weight Bearing Restrictions: No    Mobility  Bed Mobility               General bed mobility comments: Mod A and inceased instruction to use UE/LE to sccot toward head of bed. Refused up in bed/out of bed  Transfers                    Ambulation/Gait                 Stairs            Wheelchair Mobility    Modified Rankin (Stroke Patients Only)       Balance                                    Cognition Arousal/Alertness: Lethargic Behavior During Therapy: WFL for tasks assessed/performed Overall Cognitive Status: Within Functional Limits for tasks assessed                      Exercises General Exercises - Lower Extremity Ankle Circles/Pumps:  AROM;AAROM;Both;10 reps;Supine Quad Sets: Strengthening;Both;10 reps;Supine Gluteal Sets: Strengthening;Both;10 reps;Supine Short Arc Quad: AAROM;Both;10 reps;Supine Heel Slides: AROM;AAROM;Both;10 reps;Supine    General Comments        Pertinent Vitals/Pain      Home Living                      Prior Function            PT Goals (current goals can now be found in the care plan section) Progress towards PT goals: Not progressing toward goals - comment    Frequency  Min 2X/week    PT Plan Current plan remains appropriate    Co-evaluation             End of Session   Activity Tolerance: Patient limited by fatigue;Patient limited by lethargy Patient left: in bed;with call bell/phone within reach;with bed alarm set     Time: [id number] PT Time Calculation (min) (ACUTE ONLY): 10 min  Charges:  $Therapeutic Exercise: 8-22 mins                    G Codes:  Kristeen Miss, PTA 05/07/2016, 11:34 AM

## 2016-05-07 NOTE — Progress Notes (Signed)
  End of hd 

## 2016-05-07 NOTE — Progress Notes (Signed)
Pre-hd tx 

## 2016-05-07 NOTE — Progress Notes (Signed)
Patient ID: Dominic [name], male   DOB: 1946/05/05, 70 y.o.   MRN: [id number]   [facility] PROGRESS NOTE  Dainel Arcidiacono Ditter YNW:[id number] DOB: 1945/09/11 DOA: 05/01/2016 PCP: No primary care provider on file.  HPI/Subjective: Patient with a long more trouble breathing today. He was placed on trach collar. States he did okay with dialysis  Objective: Vitals:   05/07/16 0505 05/07/16 1117  BP: 125/61   Pulse: 90 92  Resp: 18   Temp: 97.5 F (36.4 C)     Filed Weights   05/01/16 1451 05/06/16 1915 05/06/16 2050  Weight: 104.3 kg (230 lb) 107.1 kg (236 lb 1.8 oz) 107 kg (235 lb 14.3 oz)    ROS: Review of Systems  Constitutional: Negative for chills and fever.  Eyes: Negative for blurred vision.  Respiratory: Positive for cough, shortness of breath and wheezing.   Cardiovascular: Negative for chest pain.  Gastrointestinal: Negative for abdominal pain, constipation, diarrhea, nausea and vomiting.  Genitourinary: Negative for dysuria.  Musculoskeletal: Negative for joint pain.  Neurological: Negative for dizziness and headaches.   Exam: Physical Exam  HENT:  Nose: No mucosal edema.  Mouth/Throat: No oropharyngeal exudate or posterior oropharyngeal edema.  Eyes: Conjunctivae and lids are normal.  Left eye clouded over.  Neck: No JVD present. Carotid bruit is not present. No edema present. No thyroid mass and no thyromegaly present.  Cardiovascular: S1 normal and S2 normal.  Exam reveals no gallop.   No murmur heard. Pulses:      Dorsalis pedis pulses are 2+ on the right side, and 2+ on the left side.  Respiratory: No respiratory distress. He has decreased breath sounds in the right lower field and the left lower field. He has wheezes in the right middle field, the right lower field, the left middle field and the left lower field. He has no rhonchi. He has no rales.  GI: Soft. Bowel sounds are normal. There is no tenderness.  Musculoskeletal:       Right ankle: He exhibits  swelling.       Left ankle: He exhibits swelling.  Lymphadenopathy:    He has no cervical adenopathy.  Neurological: He is alert. No cranial nerve deficit.  Skin: Skin is warm. No rash noted. Nails show no clubbing.  Psychiatric: He has a normal mood and affect.      Data Reviewed: Basic Metabolic Panel:  Recent Labs Lab 05/03/16 0628 05/03/16 1607 05/04/16 0349 05/05/16 0621 05/06/16 0456 05/07/16 0437  NA 126*  --  129* 130* 130* 132*  K 4.1  --  3.9 3.9 4.0 3.6  CL 97*  --  101 103 106 106  CO2 19*  --  18* 17* 16* 19*  GLUCOSE 179*  --  134* 134* 126* 98  BUN 76*  --  83* 87* 86* 69*  CREATININE 3.96*  --  4.21* 4.56* 4.32* 3.49*  CALCIUM 8.2*  --  7.8* 8.1* 8.0* 8.0*  MG  --  2.1 2.0  --   --   --   PHOS  --  2.4* 2.6  2.7  --   --   --    Liver Function Tests:  Recent Labs Lab 05/03/16 1607 05/04/16 0349 05/05/16 0621  AST 34  --  25  ALT 25  --  21  ALKPHOS 328*  --  359*  BILITOT 8.3*  --  4.9*  PROT 6.6  --  6.6  ALBUMIN 2.2* 1.9* 2.0*   CBC:  Recent Labs Lab 05/01/16 1454 05/02/16 0519 05/04/16 0349 05/05/16 0621 05/06/16 0456  WBC 23.7* 17.8* 11.2* 12.5* 15.3*  HGB 11.1* 10.9* 10.5* 9.6* 8.6*  HCT 34.1* 33.0* 31.8* 29.4* 26.6*  MCV 73.0* 71.0* 73.0* 72.0* 74.3*  PLT 82* 79* 149* 225 259   Cardiac Enzymes:  Recent Labs Lab 05/01/16 1454 05/03/16 0628 05/03/16 1607 05/03/16 2128 05/04/16 0349  CKTOTAL 701* 96  --   --  66  TROPONINI  --   --  0.04* 0.06* 0.04*   CBG:  Recent Labs Lab 05/06/16 0721 05/06/16 1134 05/06/16 2126 05/07/16 0744 05/07/16 1144  GLUCAP 134* 137* 144* 81 120*    Recent Results (from the past 240 hour(s))  CULTURE, BLOOD (ROUTINE X 2) w Reflex to ID Panel     Status: Abnormal   Collection Time: 05/01/16  4:24 PM  Result Value Ref Range Status   Specimen Description BLOOD RIGHT ANTECUBITAL  Final   Special Requests   Final    BOTTLES DRAWN AEROBIC AND ANAEROBIC  AER 6 ML ANA 3 ML   Culture   Setup Time   Final    GRAM NEGATIVE RODS IN BOTH AEROBIC AND ANAEROBIC BOTTLES CRITICAL RESULT CALLED TO, READ BACK BY AND VERIFIED WITH: CHRISTINE KATSOUDAS ON 05/02/16 AT 0744 BY QSD Performed [facility]    Culture KLEBSIELLA PNEUMONIAE (A)  Final   Report Status 05/04/2016 FINAL  Final   Organism ID, Bacteria KLEBSIELLA PNEUMONIAE  Final      Susceptibility   Klebsiella pneumoniae - MIC*    AMPICILLIN >=32 RESISTANT Resistant     CEFAZOLIN <=4 SENSITIVE Sensitive     CEFEPIME <=1 SENSITIVE Sensitive     CEFTAZIDIME <=1 SENSITIVE Sensitive     CEFTRIAXONE <=1 SENSITIVE Sensitive     CIPROFLOXACIN <=0.25 SENSITIVE Sensitive     GENTAMICIN <=1 SENSITIVE Sensitive     IMIPENEM <=0.25 SENSITIVE Sensitive     TRIMETH/SULFA <=20 SENSITIVE Sensitive     AMPICILLIN/SULBACTAM 4 SENSITIVE Sensitive     PIP/TAZO 16 SENSITIVE Sensitive     Extended ESBL NEGATIVE Sensitive     * KLEBSIELLA PNEUMONIAE  CULTURE, BLOOD (ROUTINE X 2) w Reflex to ID Panel     Status: Abnormal   Collection Time: 05/01/16  4:24 PM  Result Value Ref Range Status   Specimen Description BLOOD LEFT ANTECUBITAL  Final   Special Requests   Final    BOTTLES DRAWN AEROBIC AND ANAEROBIC  AER 6 ML ANA 6 ML   Culture  Setup Time   Final    GRAM NEGATIVE RODS IN BOTH AEROBIC AND ANAEROBIC BOTTLES CRITICAL VALUE NOTED.  VALUE IS CONSISTENT WITH PREVIOUSLY REPORTED AND CALLED VALUE.    Culture (A)  Final    KLEBSIELLA PNEUMONIAE SUSCEPTIBILITIES PERFORMED ON PREVIOUS CULTURE WITHIN THE LAST 5 DAYS. Performed at [facility]    Report Status 05/04/2016 FINAL  Final  Blood Culture ID Panel (Reflexed)     Status: Abnormal   Collection Time: 05/01/16  4:24 PM  Result Value Ref Range Status   Enterococcus species NOT DETECTED NOT DETECTED Final   Listeria monocytogenes NOT DETECTED NOT DETECTED Final   Staphylococcus species NOT DETECTED NOT DETECTED Final   Staphylococcus aureus NOT DETECTED NOT DETECTED  Final   Streptococcus species NOT DETECTED NOT DETECTED Final   Streptococcus agalactiae NOT DETECTED NOT DETECTED Final   Streptococcus pneumoniae NOT DETECTED NOT DETECTED Final   Streptococcus pyogenes NOT DETECTED NOT DETECTED Final  Acinetobacter baumannii NOT DETECTED NOT DETECTED Final   Enterobacteriaceae species DETECTED (A) NOT DETECTED Final    Comment: CRITICAL RESULT CALLED TO, READ BACK BY AND VERIFIED WITH: CHRISTINE KATSOUDAS ON 05/02/16 AT 0744 QSD    Enterobacter cloacae complex NOT DETECTED NOT DETECTED Final   Escherichia coli NOT DETECTED NOT DETECTED Final   Klebsiella oxytoca NOT DETECTED NOT DETECTED Final   Klebsiella pneumoniae DETECTED (A) NOT DETECTED Final    Comment: CRITICAL RESULT CALLED TO, READ BACK BY AND VERIFIED WITH: CHRISTINE KATSOUDAS ON 05/02/16 AT 0744 QSD    Proteus species NOT DETECTED NOT DETECTED Final   Serratia marcescens NOT DETECTED NOT DETECTED Final   Carbapenem resistance NOT DETECTED NOT DETECTED Final   Haemophilus influenzae NOT DETECTED NOT DETECTED Final   Neisseria meningitidis NOT DETECTED NOT DETECTED Final   Pseudomonas aeruginosa NOT DETECTED NOT DETECTED Final   Candida albicans NOT DETECTED NOT DETECTED Final   Candida glabrata NOT DETECTED NOT DETECTED Final   Candida krusei NOT DETECTED NOT DETECTED Final   Candida parapsilosis NOT DETECTED NOT DETECTED Final   Candida tropicalis NOT DETECTED NOT DETECTED Final  Urine culture     Status: None   Collection Time: 05/02/16  1:12 PM  Result Value Ref Range Status   Specimen Description URINE, RANDOM  Final   Special Requests NONE  Final   Culture NO GROWTH Performed at [facility]   Final   Report Status 05/04/2016 FINAL  Final  Culture, expectorated sputum-assessment     Status: None   Collection Time: 05/02/16  1:30 PM  Result Value Ref Range Status   Specimen Description EXPECTORATED SPUTUM  Final   Special Requests Normal  Final   Sputum evaluation THIS  SPECIMEN IS ACCEPTABLE FOR SPUTUM CULTURE  Final   Report Status 05/02/2016 FINAL  Final  Culture, respiratory (NON-Expectorated)     Status: None   Collection Time: 05/02/16  1:30 PM  Result Value Ref Range Status   Specimen Description EXPECTORATED SPUTUM  Final   Special Requests Normal Reflexed from [id number]  Final   Gram Stain   Final    MODERATE WBC PRESENT, PREDOMINANTLY PMN MODERATE SQUAMOUS EPITHELIAL CELLS PRESENT ABUNDANT GRAM NEGATIVE RODS FEW GRAM POSITIVE COCCI FEW GRAM POSITIVE RODS Performed at [facility]    Culture MULTIPLE ORGANISMS PRESENT, NONE PREDOMINANT  Final   Report Status 05/04/2016 FINAL  Final  MRSA PCR Screening     Status: None   Collection Time: 05/03/16  4:48 PM  Result Value Ref Range Status   MRSA by PCR NEGATIVE NEGATIVE Final    Comment:        The GeneXpert MRSA Assay (FDA approved for NASAL specimens only), is one component of a comprehensive MRSA colonization surveillance program. It is not intended to diagnose MRSA infection nor to guide or monitor treatment for MRSA infections.      Scheduled Meds: . aspirin EC  81 mg Oral Daily  . brimonidine  1 drop Both Eyes Q12H   And  . timolol  1 drop Both Eyes Q12H  . budesonide (PULMICORT) nebulizer solution  0.25 mg Nebulization BID  . cefTRIAXone (ROCEPHIN)  IV  2 g Intravenous Q24H  . famotidine  20 mg Oral BID  . folic acid  1 mg Oral Daily  . heparin  5,000 Units Subcutaneous Q8H  . insulin aspart  0-15 Units Subcutaneous TID WC  . insulin aspart  0-5 Units Subcutaneous QHS  . insulin glargine  20 Units Subcutaneous QHS  . ipratropium-albuterol  3 mL Nebulization Q6H  . multivitamin with minerals  1 tablet Oral Daily  . sodium chloride flush  3 mL Intravenous Q12H  . thiamine  100 mg Oral Daily   Or  . thiamine  100 mg Intravenous Daily   Continuous Infusions: . sodium chloride 40 mL/hr at 05/06/16 0803    Assessment/Plan:  1. Severe sepsis with multiorgan  failure including shock and acute renal failure. Continue Rocephin since the Klebsiella is sensitive for total of 14 days. 2. Shock resolved. Patient was on pressors for 2 days. 3. Acute kidney injury and hyponatremia. Dialysis catheter placed in the right groin. Patient had dialysis yesterday and today. 4. Chronic thrombocytopenia. Looking back at old labs this is likely secondary to alcohol. 5. Type 2 diabetes uncontrolled. On Lantus and sliding scale. 6. Mild rhabdomyolysis. Simvastatin stopped. CPK trended normal range. 7. Hyperlipidemia unspecified stop simvastatin 8. Weakness. Physical therapy evaluation recommended rehabilitation. 9. Alcohol abuse. No signs of withdrawal.  10. Chronic tracheostomy patient. Continue nebulizer treatments. Patient placed on trach collar. Continue nebulizer treatments. Patient still with a lot of secretions. 11. Anemia of chronic disease  Code Status:     Code Status Orders        Start     Ordered   05/01/16 1757  Full code  Continuous     05/01/16 1756    Code Status History    Date Active Date Inactive Code Status Order ID Comments User Context   05/01/2016  5:56 PM 05/04/2016  5:57 AM Full Code [id number]  Enid Baas, MD Inpatient     Disposition Plan: To be determined  Consultants:  Nephrology  Antibiotics:  Rocephin  Time spent: 22 minutes.  Alford Highland  [employer]

## 2016-05-08 LAB — GLUCOSE, CAPILLARY
GLUCOSE-CAPILLARY: 49 mg/dL — AB (ref 65–99)
GLUCOSE-CAPILLARY: 69 mg/dL (ref 65–99)
GLUCOSE-CAPILLARY: 71 mg/dL (ref 65–99)
Glucose-Capillary: 102 mg/dL — ABNORMAL HIGH (ref 65–99)
Glucose-Capillary: 67 mg/dL (ref 65–99)
Glucose-Capillary: 69 mg/dL (ref 65–99)
Glucose-Capillary: 92 mg/dL (ref 65–99)

## 2016-05-08 LAB — PARATHYROID HORMONE, INTACT (NO CA): PTH: 135 pg/mL — AB (ref 15–65)

## 2016-05-08 LAB — PHOSPHORUS: PHOSPHORUS: 3.2 mg/dL (ref 2.5–4.6)

## 2016-05-08 MED ORDER — SCOPOLAMINE 1 MG/3DAYS TD PT72
1.0000 | MEDICATED_PATCH | TRANSDERMAL | Status: DC
  Administered 2016-05-08 – 2016-05-11 (×3): 1.5 mg via TRANSDERMAL
  Filled 2016-05-08 (×3): qty 1

## 2016-05-08 MED ORDER — INSULIN GLARGINE 100 UNIT/ML ~~LOC~~ SOLN
11.0000 [IU] | Freq: Every day | SUBCUTANEOUS | Status: DC
  Filled 2016-05-08 (×2): qty 0.11

## 2016-05-08 NOTE — Progress Notes (Signed)
Post Dialysis Assessment.   

## 2016-05-08 NOTE — Progress Notes (Signed)
PT Cancellation Note  Patient Details Name: Dominic ChamberJohn H Bolar MRN: [id number] DOB: 1945-12-25   Cancelled Treatment:    Reason Eval/Treat Not Completed: Patient at procedure or test/unavailable. Treatment attempted; pt no available at this time. Re attempt treatment later today.    Kristeen MissHeidi Elizabeth Bishop, PTA 05/08/2016, 10:58 AM

## 2016-05-08 NOTE — Progress Notes (Signed)
Dialysis completed

## 2016-05-08 NOTE — Progress Notes (Signed)
[facility]   PATIENT NAME: Dominic [name]    MR#:  [id number]  DATE OF BIRTH:  Dec 29, 1945  SUBJECTIVE:  CHIEF COMPLAINT:   Chief Complaint  Patient presents with  . Weakness   - patient with chronic trach- admitted with sepsis, klebisella in blood- [facility]- started on hemodialysis - day 3 today, increased tracheal secretions- mostly clear  REVIEW OF SYSTEMS:  Review of Systems  Constitutional: Positive for malaise/fatigue. Negative for chills and fever.  Eyes: Positive for blurred vision.  Respiratory: Positive for cough and shortness of breath. Negative for wheezing.   Cardiovascular: Positive for leg swelling. Negative for chest pain and palpitations.  Gastrointestinal: Negative for abdominal pain, constipation, diarrhea, nausea and vomiting.  Genitourinary: Negative for dysuria.  Musculoskeletal: Positive for back pain and myalgias.  Neurological: Negative for dizziness, seizures and headaches.    DRUG ALLERGIES:   Allergies  Allergen Reactions  . Other     Oatmeal grits  . Penicillins Hives, Rash and Other (See Comments)    Has patient had a PCN reaction causing immediate rash, facial/tongue/throat swelling, SOB or lightheadedness with hypotension: possibly. Pt isn't sure Has patient had a PCN reaction causing severe rash involving mucus membranes or skin necrosis: No Has patient had a PCN reaction that required hospitalization No Has patient had a PCN reaction occurring within the last 10 years: No If all of the above answers are "NO", then may proceed with Cephalosporin use.     VITALS:  Blood pressure 138/62, pulse 65, temperature 97.7 F (36.5 C), temperature source Oral, resp. rate 20, height 5\' 11"  (1.803 m), weight 108.3 kg (238 lb 12.1 oz), SpO2 100 %.  PHYSICAL EXAMINATION:  Physical Exam  GENERAL:  70 y.o.-year-old patient lying in the bed with no acute distress.  EYES: right Pupil round, reactive to light and  accommodation. Left cornea is opaque, No scleral icterus. Extraocular muscles intact.  HEENT: Head atraumatic, normocephalic. Oropharynx and nasopharynx clear.  NECK:  Supple, no jugular venous distention. No thyroid enlargement, no tenderness. Trach in place and creamish discharge oozing through it, on trach collar LUNGS: Normal breath sounds bilaterally, no wheezing, rales,rhonchi or crepitation. No use of accessory muscles of respiration. Decreased bibasilar breath sounds CARDIOVASCULAR: S1, S2 normal. No murmurs, rubs, or gallops.  ABDOMEN: Soft, obese,  nontender, nondistended. Bowel sounds present. No organomegaly or mass.  EXTREMITIES: No pedal edema, cyanosis, or clubbing.  NEUROLOGIC: Cranial nerves II through XII are intact. Muscle strength 5/5 in all extremities. Sensation intact. Gait not checked. Global weakness noted. PSYCHIATRIC: The patient is alert and oriented x 3.  SKIN: No obvious rash, lesion, or ulcer.     LABORATORY PANEL:   CBC  Recent Labs Lab 05/06/16 0456  WBC 15.3*  HGB 8.6*  HCT 26.6*  PLT 259   ------------------------------------------------------------------------------------------------------------------  Chemistries   Recent Labs Lab 05/04/16 0349 05/05/16 0621  05/07/16 0437  NA 129* 130*  < > 132*  K 3.9 3.9  < > 3.6  CL 101 103  < > 106  CO2 18* 17*  < > 19*  GLUCOSE 134* 134*  < > 98  BUN 83* 87*  < > 69*  CREATININE 4.21* 4.56*  < > 3.49*  CALCIUM 7.8* 8.1*  < > 8.0*  MG 2.0  --   --   --   AST  --  25  --   --   ALT  --  21  --   --  ALKPHOS  --  359*  --   --   BILITOT  --  4.9*  --   --   < > = values in this interval not displayed. ------------------------------------------------------------------------------------------------------------------  Cardiac Enzymes  Recent Labs Lab 05/04/16 0349  TROPONINI 0.04*    ------------------------------------------------------------------------------------------------------------------  RADIOLOGY:  No results found.  EKG:   Orders placed or performed during the hospital encounter of 05/01/16  . ED EKG  . ED EKG  . EKG 12-Lead  . EKG 12-Lead    ASSESSMENT AND PLAN:   Whitman HeroJohn Eisel  is a 70 y.o. 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  #1 severe sepsis with multiorgan failure-blood cultures growing Klebsiella. Continue Rocephin for a total of 14 days. -Received pressors due to hypotension on admission, currently off of pressors.  #2 acute renal failure- ATN from sepsis, and rhabdomyolysis  - Discontinue IV fluids now. Started on hemodialysis. Day 3 today. - Continue monitoring urine output. hold nephrotoxins - renal US with no obstruction. Appreciate nephrology consult  #3 type 2 diabetes mellitus-currently on Lantus and sliding scale insulin. hba1c of 10.2  #4 hypertension- since BP is low normal, continue to hold norvasc and lisinopril/HCTZ  #5 chronic respiratory failure-status post chronic trach. Patient on room air at home, currently requiring trach collar at 28% FiO2. -Increased secretions to rule out trach, more serous and whitish phlegm. We will add scopolamine. 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 Continue eyedrops.  #7 DVT Prophylaxis- Heparin SQ    All the records are reviewed and case discussed with Care Management/Social Workerr. Management plans discussed with the patient, family and they are in agreement.  CODE STATUS: Full Code  TOTAL TIME TAKING CARE OF THIS PATIENT: 37 minutes.   POSSIBLE D/C IN 2 DAYS, DEPENDING ON CLINICAL  CONDITION.   Enid BaasKALISETTI,Noam Karaffa M.D on 05/08/2016 at 11:54 AM  Between 7am to 6pm - Pager - [phone]  After 6pm go to [email or web address] - Social research officer, governmentpassword [email or web address] [email or web address]  [facility]  Office  [phone]  CC: Primary care physician; No primary care provider on file.

## 2016-05-08 NOTE — Care Management (Signed)
Faxed requested HD records to Susann Givensheryl Brawner with patient pathways

## 2016-05-08 NOTE — Progress Notes (Signed)
Subjective:  Patient completed hemodialysis today. He appears have tolerated well. Patient appears to be a bit more awake and alert today.   Objective:  Vital signs in last 24 hours:  Temp:  [97.5 F (36.4 C)-97.7 F (36.5 C)] 97.7 F (36.5 C) (09/07 1406) Pulse Rate:  [55-71] 71 (09/07 1406) Resp:  [12-24] 18 (09/07 1406) BP: (90-180)/(58-166) 163/72 (09/07 1406) SpO2:  [94 %-100 %] 100 % (09/07 1406) FiO2 (%):  [28 %] 28 % (09/07 1340) Weight:  [107.8 kg (237 lb 10.5 oz)-108.3 kg (238 lb 12.1 oz)] 107.8 kg (237 lb 10.5 oz) (09/07 1340)  Weight change: 1.8 kg (3 lb 15.5 oz) Filed Weights   05/07/16 1600 05/08/16 1011 05/08/16 1340  Weight: 108.1 kg (238 lb 5.1 oz) 108.3 kg (238 lb 12.1 oz) 107.8 kg (237 lb 10.5 oz)    Intake/Output:    Intake/Output Summary (Last 24 hours) at 05/08/16 1451 Last data filed at 05/08/16 1428  Gross per 24 hour  Intake          1224.66 ml  Output             1400 ml  Net          -175.34 ml     Physical Exam: General: No acute distress, resting in bed  HEENT Left corneal opacity  Neck Tracheostomy in place with drainage  Pulm/lungs Coarse breath sounds bilaterally  CVS/Heart S1S2 no rubs  Abdomen:  Soft, mildly distended, nontender  Extremities: trace peripheral edema  Neurologic: Alert, awake, able to communicate  Skin: No acute rashes  Access: Right femoral dialysis catheter       Basic Metabolic Panel:   Recent Labs Lab 05/03/16 0628 05/03/16 1607 05/04/16 0349 05/05/16 0621 05/06/16 0456 05/07/16 0437 05/07/16 1815 05/08/16 1010  NA 126*  --  129* 130* 130* 132*  --   --   K 4.1  --  3.9 3.9 4.0 3.6  --   --   CL 97*  --  101 103 106 106  --   --   CO2 19*  --  18* 17* 16* 19*  --   --   GLUCOSE 179*  --  134* 134* 126* 98  --   --   BUN 76*  --  83* 87* 86* 69*  --   --   CREATININE 3.96*  --  4.21* 4.56* 4.32* 3.49*  --   --   CALCIUM 8.2*  --  7.8* 8.1* 8.0* 8.0*  --   --   MG  --  2.1 2.0  --   --   --    --   --   PHOS  --  2.4* 2.6  2.7  --   --   --  2.4* 3.2     CBC:  Recent Labs Lab 05/01/16 1454 05/02/16 0519 05/04/16 0349 05/05/16 0621 05/06/16 0456  WBC 23.7* 17.8* 11.2* 12.5* 15.3*  HGB 11.1* 10.9* 10.5* 9.6* 8.6*  HCT 34.1* 33.0* 31.8* 29.4* 26.6*  MCV 73.0* 71.0* 73.0* 72.0* 74.3*  PLT 82* 79* 149* 225 259      Microbiology:  Recent Results (from the past 720 hour(s))  CULTURE, BLOOD (ROUTINE X 2) w Reflex to ID Panel     Status: Abnormal   Collection Time: 05/01/16  4:24 PM  Result Value Ref Range Status   Specimen Description BLOOD RIGHT ANTECUBITAL  Final   Special Requests   Final    BOTTLES DRAWN AEROBIC  AND ANAEROBIC  AER 6 ML ANA 3 ML   Culture  Setup Time   Final    GRAM NEGATIVE RODS IN BOTH AEROBIC AND ANAEROBIC BOTTLES CRITICAL RESULT CALLED TO, READ BACK BY AND VERIFIED WITH: CHRISTINE KATSOUDAS ON 05/02/16 AT 0744 BY QSD Performed at [facility]    Culture KLEBSIELLA PNEUMONIAE (A)  Final   Report Status 05/04/2016 FINAL  Final   Organism ID, Bacteria KLEBSIELLA PNEUMONIAE  Final      Susceptibility   Klebsiella pneumoniae - MIC*    AMPICILLIN >=32 RESISTANT Resistant     CEFAZOLIN <=4 SENSITIVE Sensitive     CEFEPIME <=1 SENSITIVE Sensitive     CEFTAZIDIME <=1 SENSITIVE Sensitive     CEFTRIAXONE <=1 SENSITIVE Sensitive     CIPROFLOXACIN <=0.25 SENSITIVE Sensitive     GENTAMICIN <=1 SENSITIVE Sensitive     IMIPENEM <=0.25 SENSITIVE Sensitive     TRIMETH/SULFA <=20 SENSITIVE Sensitive     AMPICILLIN/SULBACTAM 4 SENSITIVE Sensitive     PIP/TAZO 16 SENSITIVE Sensitive     Extended ESBL NEGATIVE Sensitive     * KLEBSIELLA PNEUMONIAE  CULTURE, BLOOD (ROUTINE X 2) w Reflex to ID Panel     Status: Abnormal   Collection Time: 05/01/16  4:24 PM  Result Value Ref Range Status   Specimen Description BLOOD LEFT ANTECUBITAL  Final   Special Requests   Final    BOTTLES DRAWN AEROBIC AND ANAEROBIC  AER 6 ML ANA 6 ML   Culture  Setup Time    Final    GRAM NEGATIVE RODS IN BOTH AEROBIC AND ANAEROBIC BOTTLES CRITICAL VALUE NOTED.  VALUE IS CONSISTENT WITH PREVIOUSLY REPORTED AND CALLED VALUE.    Culture (A)  Final    KLEBSIELLA PNEUMONIAE SUSCEPTIBILITIES PERFORMED ON PREVIOUS CULTURE WITHIN THE LAST 5 DAYS. Performed at [facility]    Report Status 05/04/2016 FINAL  Final  Blood Culture ID Panel (Reflexed)     Status: Abnormal   Collection Time: 05/01/16  4:24 PM  Result Value Ref Range Status   Enterococcus species NOT DETECTED NOT DETECTED Final   Listeria monocytogenes NOT DETECTED NOT DETECTED Final   Staphylococcus species NOT DETECTED NOT DETECTED Final   Staphylococcus aureus NOT DETECTED NOT DETECTED Final   Streptococcus species NOT DETECTED NOT DETECTED Final   Streptococcus agalactiae NOT DETECTED NOT DETECTED Final   Streptococcus pneumoniae NOT DETECTED NOT DETECTED Final   Streptococcus pyogenes NOT DETECTED NOT DETECTED Final   Acinetobacter baumannii NOT DETECTED NOT DETECTED Final   Enterobacteriaceae species DETECTED (A) NOT DETECTED Final    Comment: CRITICAL RESULT CALLED TO, READ BACK BY AND VERIFIED WITH: CHRISTINE KATSOUDAS ON 05/02/16 AT 0744 QSD    Enterobacter cloacae complex NOT DETECTED NOT DETECTED Final   Escherichia coli NOT DETECTED NOT DETECTED Final   Klebsiella oxytoca NOT DETECTED NOT DETECTED Final   Klebsiella pneumoniae DETECTED (A) NOT DETECTED Final    Comment: CRITICAL RESULT CALLED TO, READ BACK BY AND VERIFIED WITH: CHRISTINE KATSOUDAS ON 05/02/16 AT 0744 QSD    Proteus species NOT DETECTED NOT DETECTED Final   Serratia marcescens NOT DETECTED NOT DETECTED Final   Carbapenem resistance NOT DETECTED NOT DETECTED Final   Haemophilus influenzae NOT DETECTED NOT DETECTED Final   Neisseria meningitidis NOT DETECTED NOT DETECTED Final   Pseudomonas aeruginosa NOT DETECTED NOT DETECTED Final   Candida albicans NOT DETECTED NOT DETECTED Final   Candida glabrata NOT DETECTED  NOT DETECTED Final   Candida krusei  NOT DETECTED NOT DETECTED Final   Candida parapsilosis NOT DETECTED NOT DETECTED Final   Candida tropicalis NOT DETECTED NOT DETECTED Final  Urine culture     Status: None   Collection Time: 05/02/16  1:12 PM  Result Value Ref Range Status   Specimen Description URINE, RANDOM  Final   Special Requests NONE  Final   Culture NO GROWTH Performed at [facility]   Final   Report Status 05/04/2016 FINAL  Final  Culture, expectorated sputum-assessment     Status: None   Collection Time: 05/02/16  1:30 PM  Result Value Ref Range Status   Specimen Description EXPECTORATED SPUTUM  Final   Special Requests Normal  Final   Sputum evaluation THIS SPECIMEN IS ACCEPTABLE FOR SPUTUM CULTURE  Final   Report Status 05/02/2016 FINAL  Final  Culture, respiratory (NON-Expectorated)     Status: None   Collection Time: 05/02/16  1:30 PM  Result Value Ref Range Status   Specimen Description EXPECTORATED SPUTUM  Final   Special Requests Normal Reflexed from [id number]  Final   Gram Stain   Final    MODERATE WBC PRESENT, PREDOMINANTLY PMN MODERATE SQUAMOUS EPITHELIAL CELLS PRESENT ABUNDANT GRAM NEGATIVE RODS FEW GRAM POSITIVE COCCI FEW GRAM POSITIVE RODS Performed at [facility]    Culture MULTIPLE ORGANISMS PRESENT, NONE PREDOMINANT  Final   Report Status 05/04/2016 FINAL  Final  MRSA PCR Screening     Status: None   Collection Time: 05/03/16  4:48 PM  Result Value Ref Range Status   MRSA by PCR NEGATIVE NEGATIVE Final    Comment:        The GeneXpert MRSA Assay (FDA approved for NASAL specimens only), is one component of a comprehensive MRSA colonization surveillance program. It is not intended to diagnose MRSA infection nor to guide or monitor treatment for MRSA infections.     Coagulation Studies: No results for input(s): LABPROT, INR in the last 72 hours.  Urinalysis: No results for input(s): COLORURINE, LABSPEC, PHURINE, GLUCOSEU,  HGBUR, BILIRUBINUR, KETONESUR, PROTEINUR, UROBILINOGEN, NITRITE, LEUKOCYTESUR in the last 72 hours.  Invalid input(s): APPERANCEUR    Imaging: No results found.   Medications:     . aspirin EC  81 mg Oral Daily  . brimonidine  1 drop Both Eyes Q12H   And  . timolol  1 drop Both Eyes Q12H  . budesonide (PULMICORT) nebulizer solution  0.25 mg Nebulization BID  . cefTRIAXone (ROCEPHIN)  IV  2 g Intravenous Q24H  . famotidine  20 mg Oral BID  . folic acid  1 mg Oral Daily  . heparin  5,000 Units Subcutaneous Q8H  . insulin aspart  0-15 Units Subcutaneous TID WC  . insulin aspart  0-5 Units Subcutaneous QHS  . insulin glargine  11 Units Subcutaneous QHS  . ipratropium-albuterol  3 mL Nebulization Q6H  . multivitamin with minerals  1 tablet Oral Daily  . scopolamine  1 patch Transdermal Q72H  . sodium chloride flush  3 mL Intravenous Q12H  . thiamine  100 mg Oral Daily   Or  . 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   1.  Acute renal failure, likely ATN from concurrent sepsis/bacteremia U/a with TNTC RBC, WBC, protein, glucose, Billirubin Renal ultrasound is negative for obstruction/stone Baseline creatinine likely 0.89 from November 2014.  No other labs available for review  - bUN and creatinine have improved under the influence of dialysis.  Urine output over the preceding 24 hours was 300 cc. We will reassess the patient for  dialysis treatment tomorrow as well.  2. Hyponatremia No new serum sodium today.  Recheck tomorrow.  3.  Thrombocytopenia Most likely sepsis related. Platelet count up to 259.  4.  Diabetes type 2 insulin-dependent Poor control Hb1C 10.2 %  5. Sepsis with Klebsiella Pneumonia - continue ceftriaxone 2 g IV daily.   LOS: 7 Deron Poole 9/7/20172:51 PM

## 2016-05-08 NOTE — Progress Notes (Signed)
Inpatient Diabetes Program Recommendations  AACE/ADA: New Consensus Statement on Inpatient Glycemic Control (2015)  Target Ranges:  Prepandial:   less than 140 mg/dL      Peak postprandial:   less than 180 mg/dL (1-2 hours)      Critically ill patients:  140 - 180 mg/dL   Lab Results  Component Value Date   GLUCAP 69 05/08/2016   HGBA1C 10.2 ([name]) 05/01/2016    Review of Glycemic Control  Results for Dominic [name], Dominic [name] (MRN [id number]) as of 05/08/2016 09:54  Ref. Range 05/07/2016 11:44 05/07/2016 17:27 05/07/2016 21:13 05/08/2016 07:26 05/08/2016 08:23  Glucose-Capillary Latest Ref Range: 65 - 99 mg/dL [name] [name] 78 49 (L) 69    Diabetes history: DM2 Outpatient Diabetes medications: None  Current orders for Inpatient glycemic control: Lantus 20 units QHS, Novolog 0-15 units TID with meals, Novolog 0-5 units QHS  Please decrease Lantus insulin to 11 units (0.1units/kg) qhs since he had a low blood sugar this am.   Susette RacerJulie Yuliet Needs, RN, BA, [place]HA, CDE Diabetes Coordinator Inpatient Diabetes Program  [phone] (Team Pager) [phone] [facility] Office) 05/08/2016 9:53 AM

## 2016-05-08 NOTE — Progress Notes (Signed)
   05/06/16 2050  Post-Hemodialysis Assessment  Rinseback Volume (mL) 250 mL  Dialyzer Clearance Lightly streaked  Duration of HD Treatment -hour(s) 1.5 hour(s)  Hemodialysis Intake (mL) 500 mL  UF Total -Machine (mL) 500 mL  Net UF (mL) 0 mL  Tolerated HD Treatment Yes  Post-Hemodialysis Comments goal met, tx ended, pt alert, resting, vss, report to rn and ccmd notified

## 2016-05-08 NOTE — Progress Notes (Signed)
CBG 69, pt given orange juice. CBG 67 on recheck. Given more orange juice, 1 pack of peanut butter & crackers. CBG 102. MD notified, lantus held per order. Will continue to monitor pt.

## 2016-05-08 NOTE — Progress Notes (Signed)
Pre-Dialysis Assessment. 

## 2016-05-08 NOTE — Progress Notes (Signed)
Pre Dialysis 

## 2016-05-08 NOTE — Progress Notes (Signed)
Dialysis initiated

## 2016-05-08 NOTE — Progress Notes (Signed)
   05/07/16 1600  Post-Hemodialysis Assessment  Rinseback Volume (mL) 250 mL  Dialyzer Clearance Lightly streaked  Duration of HD Treatment -hour(s) 2.5 hour(s)  Hemodialysis Intake (mL) 750 mL  UF Total -Machine (mL) 900 mL  Net UF (mL) 150 mL  Tolerated HD Treatment Yes  Post-Hemodialysis Comments goal not met, tx ended, pt alert, no c/o, vss.

## 2016-05-09 LAB — BASIC METABOLIC PANEL
Anion gap: 6 (ref 5–15)
BUN: 25 mg/dL — AB (ref 6–20)
CO2: 30 mmol/L (ref 22–32)
CREATININE: 1.72 mg/dL — AB (ref 0.61–1.24)
Calcium: 8.1 mg/dL — ABNORMAL LOW (ref 8.9–10.3)
Chloride: 103 mmol/L (ref 101–111)
GFR calc Af Amer: 45 mL/min — ABNORMAL LOW (ref >60)
GFR, EST NON AFRICAN AMERICAN: 39 mL/min — AB (ref >60)
GLUCOSE: 124 mg/dL — AB (ref 65–99)
POTASSIUM: 3.6 mmol/L (ref 3.5–5.1)
SODIUM: 139 mmol/L (ref 135–145)

## 2016-05-09 LAB — GLUCOSE, CAPILLARY
GLUCOSE-CAPILLARY: 112 mg/dL — AB (ref 65–99)
Glucose-Capillary: 107 mg/dL — ABNORMAL HIGH (ref 65–99)
Glucose-Capillary: 124 mg/dL — ABNORMAL HIGH (ref 65–99)
Glucose-Capillary: 151 mg/dL — ABNORMAL HIGH (ref 65–99)

## 2016-05-09 MED ORDER — INSULIN GLARGINE 100 UNIT/ML ~~LOC~~ SOLN
5.0000 [IU] | Freq: Every day | SUBCUTANEOUS | Status: DC
  Administered 2016-05-09 – 2016-05-10 (×2): 5 [IU] via SUBCUTANEOUS
  Filled 2016-05-09 (×3): qty 0.05

## 2016-05-09 MED ORDER — SENNOSIDES-DOCUSATE SODIUM 8.6-50 MG PO TABS
2.0000 | ORAL_TABLET | Freq: Every day | ORAL | Status: DC
  Administered 2016-05-09 – 2016-05-12 (×4): 2 via ORAL
  Filled 2016-05-09 (×4): qty 2

## 2016-05-09 MED ORDER — POLYETHYLENE GLYCOL 3350 17 G PO PACK: 17.0000 g | PACK | Freq: Every day | ORAL | Status: DC | PRN

## 2016-05-09 NOTE — Progress Notes (Signed)
[facility] [place] [facility]   PATIENT NAME: Joeseph Verville    MR#:  [id number]  DATE OF BIRTH:  02-Mar-1946  SUBJECTIVE:  CHIEF COMPLAINT:   Chief Complaint  Patient presents with  . Weakness   - more alert, making urine, holding off dialysis for today, received 3 sessions for ARF - continues to have increased tracheal secretions- mostly clear  REVIEW OF SYSTEMS:  Review of Systems  Constitutional: Positive for malaise/fatigue. Negative for chills and fever.  Eyes: Positive for blurred vision.  Respiratory: Positive for cough and shortness of breath. Negative for wheezing.   Cardiovascular: Positive for leg swelling. Negative for chest pain and palpitations.  Gastrointestinal: Negative for abdominal pain, constipation, diarrhea, nausea and vomiting.  Genitourinary: Negative for dysuria.  Musculoskeletal: Positive for back pain and myalgias.  Neurological: Negative for dizziness, seizures and headaches.    DRUG ALLERGIES:   Allergies  Allergen Reactions  . Other     Oatmeal grits  . Penicillins Hives, Rash and Other (See Comments)    Has patient had a PCN reaction causing immediate rash, facial/tongue/throat swelling, SOB or lightheadedness with hypotension: possibly. Pt isn't sure Has patient had a PCN reaction causing severe rash involving mucus membranes or skin necrosis: No Has patient had a PCN reaction that required hospitalization No Has patient had a PCN reaction occurring within the last 10 years: No If all of the above answers are "NO", then may proceed with Cephalosporin use.     VITALS:  Blood pressure (!) 141/62, pulse 61, temperature 98 F (36.7 C), resp. rate (!) 24, height 5\' 11"  (1.803 m), weight 107.8 kg (237 lb 10.5 oz), SpO2 99 %.  PHYSICAL EXAMINATION:  Physical Exam  GENERAL:  70 y.o.-year-old patient lying in the bed with no acute distress.  EYES: right Pupil round, reactive to light and accommodation. Left cornea is  opaque, No scleral icterus. Extraocular muscles intact.  HEENT: Head atraumatic, normocephalic. Oropharynx and nasopharynx clear.  NECK:  Supple, no jugular venous distention. No thyroid enlargement, no tenderness. Trach in place and creamish discharge oozing through it, on trach collar LUNGS: Normal breath sounds bilaterally, no wheezing, rales,rhonchi or crepitation. No use of accessory muscles of respiration. Decreased bibasilar breath sounds CARDIOVASCULAR: S1, S2 normal. No murmurs, rubs, or gallops.  ABDOMEN: Soft, obese,  nontender, nondistended. Bowel sounds present. No organomegaly or mass.  EXTREMITIES: No pedal edema, cyanosis, or clubbing.  NEUROLOGIC: Cranial nerves II through XII are intact. Muscle strength 5/5 in all extremities. Sensation intact. Gait not checked. Global weakness noted. PSYCHIATRIC: The patient is alert and oriented x 3.  SKIN: No obvious rash, lesion, or ulcer.     LABORATORY PANEL:   CBC  Recent Labs Lab 05/06/16 0456  WBC 15.3*  HGB 8.6*  HCT 26.6*  PLT 259   ------------------------------------------------------------------------------------------------------------------  Chemistries   Recent Labs Lab 05/04/16 0349 05/05/16 0621  05/09/16 0446  NA 129* 130*  < > 139  K 3.9 3.9  < > 3.6  CL 101 103  < > 103  CO2 18* 17*  < > 30  GLUCOSE 134* 134*  < > 124*  BUN 83* 87*  < > 25*  CREATININE 4.21* 4.56*  < > 1.72*  CALCIUM 7.8* 8.1*  < > 8.1*  MG 2.0  --   --   --   AST  --  25  --   --   ALT  --  21  --   --  ALKPHOS  --  359*  --   --   BILITOT  --  4.9*  --   --   < > = values in this interval not displayed. ------------------------------------------------------------------------------------------------------------------  Cardiac Enzymes  Recent Labs Lab 05/04/16 0349  TROPONINI 0.04*   ------------------------------------------------------------------------------------------------------------------  RADIOLOGY:  No  results found.  EKG:   Orders placed or performed during the hospital encounter of 05/01/16  . ED EKG  . ED EKG  . EKG 12-Lead  . EKG 12-Lead    ASSESSMENT AND PLAN:   Whitman HeroJohn Bayles  is a 70 y.o. 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  #1 severe sepsis with multiorgan failure-blood cultures growing Klebsiella. Continue Rocephin for a total of 14 days. -Received pressors due to hypotension on admission, currently off of pressors.  #2 acute renal failure- ATN from sepsis, and rhabdomyolysis  - off IV fluids now. Received 3 sessions of hemodialysis. Off today as urine output is improving - Continue monitoring urine output. hold nephrotoxins - renal US with no obstruction. Appreciate nephrology consult  #3 type 2 diabetes mellitus-currently on Lantus - decrease dose as poor po intake and low sugars and sliding scale insulin. hba1c of 10.2  #4 hypertension- since BP is low normal, continue to hold norvasc and lisinopril/HCTZ  #5 chronic respiratory failure-status post chronic trach. Patient on room air at home, currently requiring trach collar at 28% FiO2. -Increased secretions to rule out trach, more serous and whitish phlegm. added scopolamine. 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 Continue eyedrops.  #7 DVT Prophylaxis- Heparin SQ    All the records are reviewed and case discussed with Care Management/Social Workerr. Management plans discussed with the patient, family and they are in agreement.  CODE STATUS: Full Code  TOTAL TIME TAKING CARE OF THIS PATIENT: 37 minutes.   POSSIBLE D/C IN 2 DAYS, DEPENDING ON CLINICAL CONDITION.   Enid BaasKALISETTI,Tishana Clinkenbeard M.D on 05/09/2016 at 2:03  PM  Between 7am to 6pm - Pager - [phone]  After 6pm go to [email or web address] - Social research officer, governmentpassword EPAS [facility]  [facility] [place] [facility]  Office  [phone]  CC: Primary care physician; No primary care provider on file.

## 2016-05-09 NOTE — Progress Notes (Signed)
Subjective:  Yesterday urine output was 950 cc. Renal function trend has improved under the influence of dialysis. Patient appears to be breathing comfortably at the moment.    Objective:  Vital signs in last 24 hours:  Temp:  [97.7 F (36.5 C)-98 F (36.7 C)] 97.7 F (36.5 C) (09/08 1408) Pulse Rate:  [61-66] 64 (09/08 1500) Resp:  [18-24] 20 (09/08 1408) BP: (139-141)/(62-69) 139/69 (09/08 1408) SpO2:  [99 %-100 %] 100 % (09/08 1500) FiO2 (%):  [28 %] 28 % (09/08 0725)  Weight change: -0.6 kg (-1 lb 5.2 oz) Filed Weights   05/07/16 1600 05/08/16 1011 05/08/16 1340  Weight: 108.1 kg (238 lb 5.1 oz) 108.3 kg (238 lb 12.1 oz) 107.8 kg (237 lb 10.5 oz)    Intake/Output:    Intake/Output Summary (Last 24 hours) at 05/09/16 1629 Last data filed at 05/09/16 1600  Gross per 24 hour  Intake              120 ml  Output              500 ml  Net             -380 ml     Physical Exam: General: No acute distress, resting in bed  HEENT Left corneal opacity  Neck Tracheostomy in place with drainage  Pulm/lungs Coarse breath sounds bilaterally  CVS/Heart S1S2 no rubs  Abdomen:  Soft, mildly distended, nontender  Extremities: trace peripheral edema  Neurologic: Alert, awake, able to communicate  Skin: No acute rashes  Access: Right femoral dialysis catheter       Basic Metabolic Panel:   Recent Labs Lab 05/03/16 1607 05/04/16 0349 05/05/16 0621 05/06/16 0456 05/07/16 0437 05/07/16 1815 05/08/16 1010 05/09/16 0446  NA  --  129* 130* 130* 132*  --   --  139  K  --  3.9 3.9 4.0 3.6  --   --  3.6  CL  --  101 103 106 106  --   --  103  CO2  --  18* 17* 16* 19*  --   --  30  GLUCOSE  --  134* 134* 126* 98  --   --  124*  BUN  --  83* 87* 86* 69*  --   --  25*  CREATININE  --  4.21* 4.56* 4.32* 3.49*  --   --  1.72*  CALCIUM  --  7.8* 8.1* 8.0* 8.0*  --   --  8.1*  MG 2.1 2.0  --   --   --   --   --   --   PHOS 2.4* 2.6  2.7  --   --   --  2.4* 3.2  --       CBC:  Recent Labs Lab 05/04/16 0349 05/05/16 0621 05/06/16 0456  WBC 11.2* 12.5* 15.3*  HGB 10.5* 9.6* 8.6*  HCT 31.8* 29.4* 26.6*  MCV 73.0* 72.0* 74.3*  PLT 149* 225 259      Microbiology:  Recent Results (from the past 720 hour(s))  CULTURE, BLOOD (ROUTINE X 2) w Reflex to ID Panel     Status: Abnormal   Collection Time: 05/01/16  4:24 PM  Result Value Ref Range Status   Specimen Description BLOOD RIGHT ANTECUBITAL  Final   Special Requests   Final    BOTTLES DRAWN AEROBIC AND ANAEROBIC  AER 6 ML ANA 3 ML   Culture  Setup Time   Final  GRAM NEGATIVE RODS IN BOTH AEROBIC AND ANAEROBIC BOTTLES CRITICAL RESULT CALLED TO, READ BACK BY AND VERIFIED WITH: CHRISTINE KATSOUDAS ON 05/02/16 AT 0744 BY QSD Performed at [facility]    Culture KLEBSIELLA PNEUMONIAE (A)  Final   Report Status 05/04/2016 FINAL  Final   Organism ID, Bacteria KLEBSIELLA PNEUMONIAE  Final      Susceptibility   Klebsiella pneumoniae - MIC*    AMPICILLIN >=32 RESISTANT Resistant     CEFAZOLIN <=4 SENSITIVE Sensitive     CEFEPIME <=1 SENSITIVE Sensitive     CEFTAZIDIME <=1 SENSITIVE Sensitive     CEFTRIAXONE <=1 SENSITIVE Sensitive     CIPROFLOXACIN <=0.25 SENSITIVE Sensitive     GENTAMICIN <=1 SENSITIVE Sensitive     IMIPENEM <=0.25 SENSITIVE Sensitive     TRIMETH/SULFA <=20 SENSITIVE Sensitive     AMPICILLIN/SULBACTAM 4 SENSITIVE Sensitive     PIP/TAZO 16 SENSITIVE Sensitive     Extended ESBL NEGATIVE Sensitive     * KLEBSIELLA PNEUMONIAE  CULTURE, BLOOD (ROUTINE X 2) w Reflex to ID Panel     Status: Abnormal   Collection Time: 05/01/16  4:24 PM  Result Value Ref Range Status   Specimen Description BLOOD LEFT ANTECUBITAL  Final   Special Requests   Final    BOTTLES DRAWN AEROBIC AND ANAEROBIC  AER 6 ML ANA 6 ML   Culture  Setup Time   Final    GRAM NEGATIVE RODS IN BOTH AEROBIC AND ANAEROBIC BOTTLES CRITICAL VALUE NOTED.  VALUE IS CONSISTENT WITH PREVIOUSLY REPORTED AND  CALLED VALUE.    Culture (A)  Final    KLEBSIELLA PNEUMONIAE SUSCEPTIBILITIES PERFORMED ON PREVIOUS CULTURE WITHIN THE LAST 5 DAYS. Performed at [facility]    Report Status 05/04/2016 FINAL  Final  Blood Culture ID Panel (Reflexed)     Status: Abnormal   Collection Time: 05/01/16  4:24 PM  Result Value Ref Range Status   Enterococcus species NOT DETECTED NOT DETECTED Final   Listeria monocytogenes NOT DETECTED NOT DETECTED Final   Staphylococcus species NOT DETECTED NOT DETECTED Final   Staphylococcus aureus NOT DETECTED NOT DETECTED Final   Streptococcus species NOT DETECTED NOT DETECTED Final   Streptococcus agalactiae NOT DETECTED NOT DETECTED Final   Streptococcus pneumoniae NOT DETECTED NOT DETECTED Final   Streptococcus pyogenes NOT DETECTED NOT DETECTED Final   Acinetobacter baumannii NOT DETECTED NOT DETECTED Final   Enterobacteriaceae species DETECTED (A) NOT DETECTED Final    Comment: CRITICAL RESULT CALLED TO, READ BACK BY AND VERIFIED WITH: CHRISTINE KATSOUDAS ON 05/02/16 AT 0744 QSD    Enterobacter cloacae complex NOT DETECTED NOT DETECTED Final   Escherichia coli NOT DETECTED NOT DETECTED Final   Klebsiella oxytoca NOT DETECTED NOT DETECTED Final   Klebsiella pneumoniae DETECTED (A) NOT DETECTED Final    Comment: CRITICAL RESULT CALLED TO, READ BACK BY AND VERIFIED WITH: CHRISTINE KATSOUDAS ON 05/02/16 AT 0744 QSD    Proteus species NOT DETECTED NOT DETECTED Final   Serratia marcescens NOT DETECTED NOT DETECTED Final   Carbapenem resistance NOT DETECTED NOT DETECTED Final   Haemophilus influenzae NOT DETECTED NOT DETECTED Final   Neisseria meningitidis NOT DETECTED NOT DETECTED Final   Pseudomonas aeruginosa NOT DETECTED NOT DETECTED Final   Candida albicans NOT DETECTED NOT DETECTED Final   Candida glabrata NOT DETECTED NOT DETECTED Final   Candida krusei NOT DETECTED NOT DETECTED Final   Candida parapsilosis NOT DETECTED NOT DETECTED Final   Candida  tropicalis NOT DETECTED NOT  DETECTED Final  Urine culture     Status: None   Collection Time: 05/02/16  1:12 PM  Result Value Ref Range Status   Specimen Description URINE, RANDOM  Final   Special Requests NONE  Final   Culture NO GROWTH Performed at [facility]   Final   Report Status 05/04/2016 FINAL  Final  Culture, expectorated sputum-assessment     Status: None   Collection Time: 05/02/16  1:30 PM  Result Value Ref Range Status   Specimen Description EXPECTORATED SPUTUM  Final   Special Requests Normal  Final   Sputum evaluation THIS SPECIMEN IS ACCEPTABLE FOR SPUTUM CULTURE  Final   Report Status 05/02/2016 FINAL  Final  Culture, respiratory (NON-Expectorated)     Status: None   Collection Time: 05/02/16  1:30 PM  Result Value Ref Range Status   Specimen Description EXPECTORATED SPUTUM  Final   Special Requests Normal Reflexed from [id number]  Final   Gram Stain   Final    MODERATE WBC PRESENT, PREDOMINANTLY PMN MODERATE SQUAMOUS EPITHELIAL CELLS PRESENT ABUNDANT GRAM NEGATIVE RODS FEW GRAM POSITIVE COCCI FEW GRAM POSITIVE RODS Performed at [facility]    Culture MULTIPLE ORGANISMS PRESENT, NONE PREDOMINANT  Final   Report Status 05/04/2016 FINAL  Final  MRSA PCR Screening     Status: None   Collection Time: 05/03/16  4:48 PM  Result Value Ref Range Status   MRSA by PCR NEGATIVE NEGATIVE Final    Comment:        The GeneXpert MRSA Assay (FDA approved for NASAL specimens only), is one component of a comprehensive MRSA colonization surveillance program. It is not intended to diagnose MRSA infection nor to guide or monitor treatment for MRSA infections.     Coagulation Studies: No results for input(s): LABPROT, INR in the last 72 hours.  Urinalysis: No results for input(s): COLORURINE, LABSPEC, PHURINE, GLUCOSEU, HGBUR, BILIRUBINUR, KETONESUR, PROTEINUR, UROBILINOGEN, NITRITE, LEUKOCYTESUR in the last 72 hours.  Invalid input(s): APPERANCEUR     Imaging: No results found.   Medications:     . aspirin EC  81 mg Oral Daily  . brimonidine  1 drop Both Eyes Q12H   And  . timolol  1 drop Both Eyes Q12H  . budesonide (PULMICORT) nebulizer solution  0.25 mg Nebulization BID  . cefTRIAXone (ROCEPHIN)  IV  2 g Intravenous Q24H  . folic acid  1 mg Oral Daily  . heparin  5,000 Units Subcutaneous Q8H  . insulin aspart  0-15 Units Subcutaneous TID WC  . insulin aspart  0-5 Units Subcutaneous QHS  . insulin glargine  5 Units Subcutaneous QHS  . ipratropium-albuterol  3 mL Nebulization Q6H  . multivitamin with minerals  1 tablet Oral Daily  . scopolamine  1 patch Transdermal Q72H  . senna-docusate  2 tablet Oral Daily  . sodium chloride flush  3 mL Intravenous Q12H  . thiamine  100 mg Oral Daily   Or  . thiamine  100 mg Intravenous Daily   sodium chloride, sodium chloride, acetaminophen **OR** [DISCONTINUED] acetaminophen, alteplase, alum & mag hydroxide-simeth, heparin, lidocaine (PF), lidocaine-prilocaine, miconazole nitrate, ondansetron **OR** ondansetron (ZOFRAN) IV, pentafluoroprop-tetrafluoroeth, polyethylene glycol  Assessment/ Plan:  70 y.o.African American male with medical problems of Chronic respiratory failure with chronic  tracheostomy, legally blind in left eye due to glaucoma, hypertension, diabetes, who was admitted to [facility] on 05/01/2016 for evaluation of weakness, increased falls.   1.  Acute renal failure, likely ATN from concurrent sepsis/bacteremia U/a  with TNTC RBC, WBC, protein, glucose, Billirubin Renal ultrasound is negative for obstruction/stone Baseline creatinine likely 0.89 from November 2014.  No other labs available for review  - renal function has improved under the influence of dialysis.  Creatinine currently down to 1.72.  Urine output was 950 cc over the preceding 24 hours.  We will hold off on further dialysis.  Monitor urine output as well as creatinine trend tomorrow.  2.  Hyponatremia Sodium corrected with dialysis and currently up to 139.  Continue to monitor.  3.  Thrombocytopenia Most likely sepsis related. Platelet count up to 259.  4.  Diabetes type 2 insulin-dependent Poor control Hb1C 10.2 %  5. Sepsis with Klebsiella Pneumonia - continue ceftriaxone 2 g IV daily.   LOS: 8 Rashid Whitenight 9/8/20174:29 PM

## 2016-05-09 NOTE — Progress Notes (Signed)
Inpatient Diabetes Program Recommendations  AACE/ADA: New Consensus Statement on Inpatient Glycemic Control (2015)  Target Ranges:  Prepandial:   less than 140 mg/dL      Peak postprandial:   less than 180 mg/dL (1-2 hours)      Critically ill patients:  140 - 180 mg/dL   Lab Results  Component Value Date   GLUCAP 124 ([name]) 05/09/2016   HGBA1C 10.2 ([name]) 05/01/2016    Review of Glycemic Control  Results for Dominic [name], Dominic [name] (MRN [id number]) as of 05/09/2016 12:49  Ref. Range 05/08/2016 22:06 05/08/2016 22:53 05/08/2016 23:42 05/09/2016 07:43 05/09/2016 11:30  Glucose-Capillary Latest Ref Range: 65 - 99 mg/dL 69 67 [id number] ([name]) [id number] ([name]) [id number] ([name])    Diabetes history:DM2 Outpatient Diabetes medications: None  Current orders for Inpatient glycemic control: Lantus 11 units QHS, Novolog 0-15 units TID with meals, Novolog 0-5 units QHS  Patient did not receive his Lantus insulin last night.  Please decrease Lantus insulin to 5 units qhs.  Please consider decreasing Novolog correction to sensitive correction (0-9 units) tid, Novolog 0-5 units qhs.  Susette RacerJulie Nayleah Gamel, RN, BA, MHA, CDE Diabetes Coordinator Inpatient Diabetes Program  [phone] (Team Pager) [phone] [facility] Office) 05/09/2016 12:51 PM

## 2016-05-09 NOTE — Clinical Social Work Note (Addendum)
Patient had informed CSW a couple of days ago that he wanted me to follow up with his daughter: Dominic [name] regarding bed offers. CSW contacted Dominic [name] and [facility] has [name]. CSW informed by physician this morning that patient's kidney function will be monitored through the weekend with potential discharge expected first of the week. Patient's kidney function is improving and it is hopeful patient will not require dialysis as an outpatient. CSW has updated the admission's coordinator Garment/textile technologist(Tiffany) covering [name]. [name] MSW,LCSW [phone]

## 2016-05-09 NOTE — Progress Notes (Signed)
Physical Therapy Treatment Patient Details Name: Dominic [name] MRN: [id number] DOB: 15-Nov-1945 Today's Date: 05/09/2016    History of Present Illness Pt admitted for sepsis. Pt with complaints of weakness and multiple falls at home. Pt with history of CRF s/p trach, HTN, DM, and is legally blind in L eye. Pt with no home O2 at home and is currently on room air. Pt with complicated hospital stay secondary to hypotension, and bacteremia and admitted to CCU on 9/2.  Pt s/p R temporary fem cath placement 05/06/16.     PT Comments    Unable to assess OOB mobility d/t R temporary fem cath restrictions (per PT protocol).  Pt fatigued with UE and LE ex's in bed and requesting to rest end of session d/t feeling tired.  Will continue to progress pt with increasing strength and activity tolerance per pt tolerance.   Follow Up Recommendations  SNF     Equipment Recommendations  Rolling walker with 5" wheels    Recommendations for Other Services       Precautions / Restrictions Precautions Precautions: Fall Precaution Comments: R temporary fem cath restrictions Restrictions Weight Bearing Restrictions: No    Mobility  Bed Mobility               General bed mobility comments: Not assessed d/t R temporary fem cath restrictions  Transfers                 General transfer comment: Not assessed d/t R temporary fem cath restrictions  Ambulation/Gait             General Gait Details: Not assessed d/t R temporary fem cath restrictions   Stairs            Wheelchair Mobility    Modified Rankin (Stroke Patients Only)       Balance                                    Cognition Arousal/Alertness: Awake/alert Behavior During Therapy: WFL for tasks assessed/performed Overall Cognitive Status: Within Functional Limits for tasks assessed                      Exercises General Exercises - Lower Extremity Ankle Circles/Pumps:  AROM;Strengthening;Both;Supine Quad Sets: AROM;Strengthening;Both;Supine Short Arc Quad: AROM;Strengthening;Left;Supine Heel Slides: AROM;Strengthening;Left;Supine Hip ABduction/ADduction: AROM;Strengthening;Left;Supine Shoulder Exercises Shoulder Flexion: AROM;Strengthening;Both;Supine (to 90 degrees) Elbow Flexion: AROM;Strengthening;Both;Supine Elbow Extension: AROM;Strengthening;Both;Supine Digit Composite Flexion: AROM;Strengthening;Both;Supine Composite Extension: AROM;Strengthening;Both;Supine    General Comments  Pt agreeable to PT session.      Pertinent Vitals/Pain Pain Assessment: No/denies pain  O2 100% (5 L/min O2 via trach-mask) and HR 64-66 bpm during session    Home Living                      Prior Function            PT Goals (current goals can now be found in the care plan section) Acute Rehab PT Goals Patient Stated Goal: to get stronger PT Goal Formulation: With patient Time For Goal Achievement: 05/20/16 Potential to Achieve Goals: Good Additional Goals Additional Goal #1: (after R temporary fem cath restrictions removed) pt will be able to perform bed mobility/transfers with supervision in order to improve functional mobility Progress towards PT goals: Not progressing toward goals - comment (Pt progressing with ex's/strengthening; unable to progress with functional mobility d/t temporary fem  cath restrictions)    Frequency  Min 2X/week    PT Plan Current plan remains appropriate    Co-evaluation             End of Session Equipment Utilized During Treatment: Oxygen Activity Tolerance: Patient limited by fatigue Patient left: in bed;with call bell/phone within reach;with bed alarm set     Time: 1444-1500 PT Time Calculation (min) (ACUTE ONLY): 16 min  Charges:  $Therapeutic Exercise: 8-22 mins                    G CodesHendricks [name]:      Dominic [name] 05/09/2016, 3:26 PM Dominic LimesEmily Dontrell [name], PT [phone]

## 2016-05-10 LAB — GLUCOSE, CAPILLARY
GLUCOSE-CAPILLARY: 168 mg/dL — AB (ref 65–99)
GLUCOSE-CAPILLARY: 198 mg/dL — AB (ref 65–99)
Glucose-Capillary: 96 mg/dL (ref 65–99)
Glucose-Capillary: 124 mg/dL — ABNORMAL HIGH (ref 65–99)

## 2016-05-10 LAB — CBC
HCT: 22.1 % — ABNORMAL LOW (ref 40.0–52.0)
Hemoglobin: 7 g/dL — ABNORMAL LOW (ref 13.0–18.0)
MCH: 23.1 pg — AB (ref 26.0–34.0)
MCHC: 31.7 g/dL — ABNORMAL LOW (ref 32.0–36.0)
MCV: 72.9 fL — AB (ref 80.0–100.0)
PLATELETS: 214 10*3/uL (ref 150–440)
RBC: 3.03 MIL/uL — ABNORMAL LOW (ref 4.40–5.90)
RDW: 14.7 % — AB (ref 11.5–14.5)
WBC: 12.5 10*3/uL — ABNORMAL HIGH (ref 3.8–10.6)

## 2016-05-10 LAB — BASIC METABOLIC PANEL
Anion gap: 5 (ref 5–15)
BUN: 22 mg/dL — AB (ref 6–20)
CALCIUM: 8.2 mg/dL — AB (ref 8.9–10.3)
CO2: 30 mmol/L (ref 22–32)
Chloride: 103 mmol/L (ref 101–111)
Creatinine, Ser: 1.69 mg/dL — ABNORMAL HIGH (ref 0.61–1.24)
GFR calc Af Amer: 46 mL/min — ABNORMAL LOW (ref >60)
GFR, EST NON AFRICAN AMERICAN: 39 mL/min — AB (ref >60)
GLUCOSE: 101 mg/dL — AB (ref 65–99)
Potassium: 3.6 mmol/L (ref 3.5–5.1)
Sodium: 138 mmol/L (ref 135–145)

## 2016-05-10 LAB — C4 COMPLEMENT: Complement C4, Body Fluid: 24 mg/dL (ref 14–44)

## 2016-05-10 LAB — MPO/PR-3 (ANCA) ANTIBODIES: Myeloperoxidase Abs: <9 U/mL (ref 0.0–9.0)

## 2016-05-10 LAB — ABO/RH: ABO/RH(D): O POS

## 2016-05-10 LAB — PREPARE RBC (CROSSMATCH)

## 2016-05-10 LAB — C3 COMPLEMENT: C3 Complement: 146 mg/dL (ref 82–167)

## 2016-05-10 LAB — ANA W/REFLEX IF POSITIVE: ANA: NEGATIVE

## 2016-05-10 MED ORDER — AMLODIPINE BESYLATE 5 MG PO TABS
5.0000 mg | ORAL_TABLET | Freq: Every day | ORAL | Status: AC
  Administered 2016-05-10: 5 mg via ORAL
  Filled 2016-05-10: qty 1

## 2016-05-10 MED ORDER — SODIUM CHLORIDE 0.9 % IV SOLN
Freq: Once | INTRAVENOUS | Status: AC
  Administered 2016-05-10: 15:00:00 via INTRAVENOUS

## 2016-05-10 NOTE — Progress Notes (Signed)
[facility] - [place] [facility]   PATIENT NAME: Dominic [name]    MR#:  [id number]  DATE OF BIRTH:  [birth year]  SUBJECTIVE:  CHIEF COMPLAINT:   Chief Complaint  Patient presents with  . Weakness   - feels better, decreased tracheal secretions. - creatinine Improving. Urine output not accurately recorded as patient is incontinent at times. -Hemoglobin dropped. No active bleeding  REVIEW OF SYSTEMS:  Review of Systems  Constitutional: Positive for malaise/fatigue. Negative for chills and fever.  Eyes: Positive for blurred vision.  Respiratory: Positive for cough and shortness of breath. Negative for wheezing.   Cardiovascular: Positive for leg swelling. Negative for chest pain and palpitations.  Gastrointestinal: Negative for abdominal pain, constipation, diarrhea, nausea and vomiting.  Genitourinary: Negative for dysuria.  Musculoskeletal: Negative for back pain and myalgias.  Neurological: Negative for dizziness, seizures and headaches.    DRUG ALLERGIES:   Allergies  Allergen Reactions  . Other     Oatmeal grits  . Penicillins Hives, Rash and Other (See Comments)    Has patient had a PCN reaction causing immediate rash, facial/tongue/throat swelling, SOB or lightheadedness with hypotension: possibly. Pt isn't sure Has patient had a PCN reaction causing severe rash involving mucus membranes or skin necrosis: No Has patient had a PCN reaction that required hospitalization No Has patient had a PCN reaction occurring within the last 10 years: No If all of the above answers are "NO", then may proceed with Cephalosporin use.     VITALS:  Blood pressure (!) 111/59, pulse 70, temperature 97.6 F (36.4 C), temperature source Oral, resp. rate 19, height 5\' 11"  (1.803 m), weight 107.8 kg (237 lb 10.5 oz), SpO2 100 %.  PHYSICAL EXAMINATION:  Physical Exam  GENERAL:  70 y.o.-year-old patient lying in the bed with no acute distress.  EYES: right Pupil  round, reactive to light and accommodation. Left cornea is opaque, No scleral icterus. Extraocular muscles intact.  HEENT: Head atraumatic, normocephalic. Oropharynx and nasopharynx clear.  NECK:  Supple, no jugular venous distention. No thyroid enlargement, no tenderness. Trach in place and creamish discharge oozing through it, on trach collar LUNGS: Normal breath sounds bilaterally, no wheezing, rales,rhonchi or crepitation. No use of accessory muscles of respiration. Decreased bibasilar breath sounds CARDIOVASCULAR: S1, S2 normal. No murmurs, rubs, or gallops.  ABDOMEN: Soft, obese,  nontender, nondistended. Bowel sounds present. No organomegaly or mass.  EXTREMITIES: No pedal edema, cyanosis, or clubbing.  NEUROLOGIC: Cranial nerves II through XII are intact. Muscle strength 5/5 in all extremities. Sensation intact. Gait not checked. Global weakness noted. PSYCHIATRIC: The patient is alert and oriented x 3.  SKIN: No obvious rash, lesion, or ulcer.     LABORATORY PANEL:   CBC  Recent Labs Lab 05/10/16 0606  WBC 12.5*  HGB 7.0*  HCT 22.1*  PLT 214   ------------------------------------------------------------------------------------------------------------------  Chemistries   Recent Labs Lab 05/04/16 0349 05/05/16 0621  05/10/16 0606  NA 129* 130*  < > 138  K 3.9 3.9  < > 3.6  CL 101 103  < > 103  CO2 18* 17*  < > 30  GLUCOSE 134* 134*  < > 101*  BUN 83* 87*  < > 22*  CREATININE 4.21* 4.56*  < > 1.69*  CALCIUM 7.8* 8.1*  < > 8.2*  MG 2.0  --   --   --   AST  --  25  --   --   ALT  --  21  --   --  ALKPHOS  --  359*  --   --   BILITOT  --  4.9*  --   --   < > = values in this interval not displayed. ------------------------------------------------------------------------------------------------------------------  Cardiac Enzymes  Recent Labs Lab 05/04/16 0349  TROPONINI 0.04*    ------------------------------------------------------------------------------------------------------------------  RADIOLOGY:  No results found.  EKG:   Orders placed or performed during the hospital encounter of 05/01/16  . ED EKG  . ED EKG  . EKG 12-Lead  . EKG 12-Lead    ASSESSMENT AND PLAN:   Dominic [name]  is a 70 y.o. 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  #1 severe sepsis with multiorgan failure-blood cultures growing Klebsiella. Continue Rocephin for a total of 14 days. -Received pressors due to hypotension on admission, currently off of pressors.  #2 acute renal failure- ATN from sepsis, and rhabdomyolysis  - off IV fluids now. Received 3 sessions of hemodialysis.  urine output is improving, creatinine is improving -Hopefully no further need for dialysis. Continue to monitor - Continue monitoring urine output. hold nephrotoxins - renal US with no obstruction. Appreciate nephrology consult  #3 type 2 diabetes mellitus-currently on Lantus - decrease dose as poor po intake and low sugars and sliding scale insulin. hba1c of 10.2  #4 hypertension- since BP is low normal, continue to hold norvasc and lisinopril/HCTZ  #5 chronic respiratory failure-status post chronic trach. Patient on room air at home, currently requiring trach collar at 28% FiO2. -Increased secretions to rule out trach, more serous and whitish phlegm. added scopolamine. -Also had cough medications - CXR with bibasilar atelectasis  #6 acute on chronic anemia-slow drop in hemoglobin since admission. No active bleeding. -1 unit packed RBC transfusion today..  #7 DVT Prophylaxis- Heparin SQ  Likely discharge to rehabilitation early next week if no further need for  dialysis.    All the records are reviewed and case discussed with Care Management/Social Workerr. Management plans discussed with the patient, family and they are in agreement.  CODE STATUS: Full Code  TOTAL TIME TAKING CARE OF THIS PATIENT: 37 minutes.   POSSIBLE D/C IN 2 DAYS, DEPENDING ON CLINICAL CONDITION.   Enid Baas M.D on 05/10/2016 at 12:33 PM  Between 7am to 6pm - Pager - [phone]  After 6pm go to [email or web address] - Social research officer, government  [facility]  Office  [phone]  CC: Primary care physician; No primary care provider on file.

## 2016-05-10 NOTE — Progress Notes (Signed)
05/10/2016 7:00 PM  Pt BP post blood transfusion was 176/73.  Paged prime doc. and spoke with Dr. Emmit PomfretHugelmeyer who ordered one-time P.O. Norvasc 5 mg.  Will administer and report to night shift nurse need for BP reassessment.  Bradly Chrisougherty, Farrie Sann E, RN

## 2016-05-10 NOTE — Progress Notes (Signed)
05/10/2016  11:00  Order from Dr. Cherylann RatelLateef to remove temporary dialysis catheter.  Noticed order for IV Ceftriaxone and plans to transfuse RBCs.  [facility] and explained that this was currently his only IV access and that pt is a difficult IV start.  Dr. Nemiah CommanderKalisetti asked me to leave temp cath in for now.  Bradly Chrisougherty, Seferina Brokaw E, RN

## 2016-05-10 NOTE — Progress Notes (Signed)
Subjective:  Patient seen at bedside. Renal function trend has remained stable per blood work. Overall the patient appears to be doing better.   Objective:  Vital signs in last 24 hours:  Temp:  [97.6 F (36.4 C)-98 F (36.7 C)] 97.6 F (36.4 C) (09/09 0457) Pulse Rate:  [64-70] 70 (09/09 0457) Resp:  [19-20] 19 (09/09 0457) BP: (111-150)/(59-69) 111/59 (09/09 0457) SpO2:  [94 %-100 %] 100 % (09/09 0757) FiO2 (%):  [28 %] 28 % (09/09 0757)  Weight change:  Filed Weights   05/07/16 1600 05/08/16 1011 05/08/16 1340  Weight: 108.1 kg (238 lb 5.1 oz) 108.3 kg (238 lb 12.1 oz) 107.8 kg (237 lb 10.5 oz)    Intake/Output:    Intake/Output Summary (Last 24 hours) at 05/10/16 1059 Last data filed at 05/10/16 1019  Gross per 24 hour  Intake              120 ml  Output              652 ml  Net             -532 ml     Physical Exam: General: No acute distress, resting in bed  HEENT Left corneal opacity  Neck Tracheostomy in place with drainage  Pulm/lungs Coarse breath sounds bilaterally  CVS/Heart S1S2 no rubs  Abdomen:  Soft, mildly distended, nontender  Extremities: trace peripheral edema  Neurologic: Alert, awake, able to communicate  Skin: No acute rashes  Access: Right femoral dialysis catheter       Basic Metabolic Panel:   Recent Labs Lab 05/03/16 1607  05/04/16 0349 05/05/16 0621 05/06/16 0456 05/07/16 0437 05/07/16 1815 05/08/16 1010 05/09/16 0446 05/10/16 0606  NA  --   < > 129* 130* 130* 132*  --   --  139 138  K  --   < > 3.9 3.9 4.0 3.6  --   --  3.6 3.6  CL  --   < > 101 103 106 106  --   --  103 103  CO2  --   < > 18* 17* 16* 19*  --   --  30 30  GLUCOSE  --   < > 134* 134* 126* 98  --   --  124* 101*  BUN  --   < > 83* 87* 86* 69*  --   --  25* 22*  CREATININE  --   < > 4.21* 4.56* 4.32* 3.49*  --   --  1.72* 1.69*  CALCIUM  --   < > 7.8* 8.1* 8.0* 8.0*  --   --  8.1* 8.2*  MG 2.1  --  2.0  --   --   --   --   --   --   --   PHOS 2.4*   --  2.6  2.7  --   --   --  2.4* 3.2  --   --   < > = values in this interval not displayed.   CBC:  Recent Labs Lab 05/04/16 0349 05/05/16 0621 05/06/16 0456 05/10/16 0606  WBC 11.2* 12.5* 15.3* 12.5*  HGB 10.5* 9.6* 8.6* 7.0*  HCT 31.8* 29.4* 26.6* 22.1*  MCV 73.0* 72.0* 74.3* 72.9*  PLT 149* 225 259 214      Microbiology:  Recent Results (from the past 720 hour(s))  CULTURE, BLOOD (ROUTINE X 2) w Reflex to ID Panel     Status: Abnormal   Collection Time: 05/01/16  4:24 PM  Result Value Ref Range Status   Specimen Description BLOOD RIGHT ANTECUBITAL  Final   Special Requests   Final    BOTTLES DRAWN AEROBIC AND ANAEROBIC  AER 6 ML ANA 3 ML   Culture  Setup Time   Final    GRAM NEGATIVE RODS IN BOTH AEROBIC AND ANAEROBIC BOTTLES CRITICAL RESULT CALLED TO, READ BACK BY AND VERIFIED WITH: CHRISTINE KATSOUDAS ON 05/02/16 AT 0744 BY QSD Performed at [facility]    Culture KLEBSIELLA PNEUMONIAE (A)  Final   Report Status 05/04/2016 FINAL  Final   Organism ID, Bacteria KLEBSIELLA PNEUMONIAE  Final      Susceptibility   Klebsiella pneumoniae - MIC*    AMPICILLIN >=32 RESISTANT Resistant     CEFAZOLIN <=4 SENSITIVE Sensitive     CEFEPIME <=1 SENSITIVE Sensitive     CEFTAZIDIME <=1 SENSITIVE Sensitive     CEFTRIAXONE <=1 SENSITIVE Sensitive     CIPROFLOXACIN <=0.25 SENSITIVE Sensitive     GENTAMICIN <=1 SENSITIVE Sensitive     IMIPENEM <=0.25 SENSITIVE Sensitive     TRIMETH/SULFA <=20 SENSITIVE Sensitive     AMPICILLIN/SULBACTAM 4 SENSITIVE Sensitive     PIP/TAZO 16 SENSITIVE Sensitive     Extended ESBL NEGATIVE Sensitive     * KLEBSIELLA PNEUMONIAE  CULTURE, BLOOD (ROUTINE X 2) w Reflex to ID Panel     Status: Abnormal   Collection Time: 05/01/16  4:24 PM  Result Value Ref Range Status   Specimen Description BLOOD LEFT ANTECUBITAL  Final   Special Requests   Final    BOTTLES DRAWN AEROBIC AND ANAEROBIC  AER 6 ML ANA 6 ML   Culture  Setup Time   Final     GRAM NEGATIVE RODS IN BOTH AEROBIC AND ANAEROBIC BOTTLES CRITICAL VALUE NOTED.  VALUE IS CONSISTENT WITH PREVIOUSLY REPORTED AND CALLED VALUE.    Culture (A)  Final    KLEBSIELLA PNEUMONIAE SUSCEPTIBILITIES PERFORMED ON PREVIOUS CULTURE WITHIN THE LAST 5 DAYS. Performed at [facility]    Report Status 05/04/2016 FINAL  Final  Blood Culture ID Panel (Reflexed)     Status: Abnormal   Collection Time: 05/01/16  4:24 PM  Result Value Ref Range Status   Enterococcus species NOT DETECTED NOT DETECTED Final   Listeria monocytogenes NOT DETECTED NOT DETECTED Final   Staphylococcus species NOT DETECTED NOT DETECTED Final   Staphylococcus aureus NOT DETECTED NOT DETECTED Final   Streptococcus species NOT DETECTED NOT DETECTED Final   Streptococcus agalactiae NOT DETECTED NOT DETECTED Final   Streptococcus pneumoniae NOT DETECTED NOT DETECTED Final   Streptococcus pyogenes NOT DETECTED NOT DETECTED Final   Acinetobacter baumannii NOT DETECTED NOT DETECTED Final   Enterobacteriaceae species DETECTED (A) NOT DETECTED Final    Comment: CRITICAL RESULT CALLED TO, READ BACK BY AND VERIFIED WITH: CHRISTINE KATSOUDAS ON 05/02/16 AT 0744 QSD    Enterobacter cloacae complex NOT DETECTED NOT DETECTED Final   Escherichia coli NOT DETECTED NOT DETECTED Final   Klebsiella oxytoca NOT DETECTED NOT DETECTED Final   Klebsiella pneumoniae DETECTED (A) NOT DETECTED Final    Comment: CRITICAL RESULT CALLED TO, READ BACK BY AND VERIFIED WITH: CHRISTINE KATSOUDAS ON 05/02/16 AT 0744 QSD    Proteus species NOT DETECTED NOT DETECTED Final   Serratia marcescens NOT DETECTED NOT DETECTED Final   Carbapenem resistance NOT DETECTED NOT DETECTED Final   Haemophilus influenzae NOT DETECTED NOT DETECTED Final   Neisseria meningitidis NOT DETECTED NOT DETECTED Final  Pseudomonas aeruginosa NOT DETECTED NOT DETECTED Final   Candida albicans NOT DETECTED NOT DETECTED Final   Candida glabrata NOT DETECTED NOT  DETECTED Final   Candida krusei NOT DETECTED NOT DETECTED Final   Candida parapsilosis NOT DETECTED NOT DETECTED Final   Candida tropicalis NOT DETECTED NOT DETECTED Final  Urine culture     Status: None   Collection Time: 05/02/16  1:12 PM  Result Value Ref Range Status   Specimen Description URINE, RANDOM  Final   Special Requests NONE  Final   Culture NO GROWTH Performed at [facility]   Final   Report Status 05/04/2016 FINAL  Final  Culture, expectorated sputum-assessment     Status: None   Collection Time: 05/02/16  1:30 PM  Result Value Ref Range Status   Specimen Description EXPECTORATED SPUTUM  Final   Special Requests Normal  Final   Sputum evaluation THIS SPECIMEN IS ACCEPTABLE FOR SPUTUM CULTURE  Final   Report Status 05/02/2016 FINAL  Final  Culture, respiratory (NON-Expectorated)     Status: None   Collection Time: 05/02/16  1:30 PM  Result Value Ref Range Status   Specimen Description EXPECTORATED SPUTUM  Final   Special Requests Normal Reflexed from [id number]  Final   Gram Stain   Final    MODERATE WBC PRESENT, PREDOMINANTLY PMN MODERATE SQUAMOUS EPITHELIAL CELLS PRESENT ABUNDANT GRAM NEGATIVE RODS FEW GRAM POSITIVE COCCI FEW GRAM POSITIVE RODS Performed at [facility]    Culture MULTIPLE ORGANISMS PRESENT, NONE PREDOMINANT  Final   Report Status 05/04/2016 FINAL  Final  MRSA PCR Screening     Status: None   Collection Time: 05/03/16  4:48 PM  Result Value Ref Range Status   MRSA by PCR NEGATIVE NEGATIVE Final    Comment:        The GeneXpert MRSA Assay (FDA approved for NASAL specimens only), is one component of a comprehensive MRSA colonization surveillance program. It is not intended to diagnose MRSA infection nor to guide or monitor treatment for MRSA infections.     Coagulation Studies: No results for input(s): LABPROT, INR in the last 72 hours.  Urinalysis: No results for input(s): COLORURINE, LABSPEC, PHURINE, GLUCOSEU, HGBUR,  BILIRUBINUR, KETONESUR, PROTEINUR, UROBILINOGEN, NITRITE, LEUKOCYTESUR in the last 72 hours.  Invalid input(s): APPERANCEUR    Imaging: No results found.   Medications:     . aspirin EC  81 mg Oral Daily  . brimonidine  1 drop Both Eyes Q12H   And  . timolol  1 drop Both Eyes Q12H  . budesonide (PULMICORT) nebulizer solution  0.25 mg Nebulization BID  . cefTRIAXone (ROCEPHIN)  IV  2 g Intravenous Q24H  . folic acid  1 mg Oral Daily  . heparin  5,000 Units Subcutaneous Q8H  . insulin aspart  0-15 Units Subcutaneous TID WC  . insulin aspart  0-5 Units Subcutaneous QHS  . insulin glargine  5 Units Subcutaneous QHS  . ipratropium-albuterol  3 mL Nebulization Q6H  . multivitamin with minerals  1 tablet Oral Daily  . scopolamine  1 patch Transdermal Q72H  . senna-docusate  2 tablet Oral Daily  . sodium chloride flush  3 mL Intravenous Q12H  . thiamine  100 mg Oral Daily   Or  . thiamine  100 mg Intravenous Daily   sodium chloride, sodium chloride, acetaminophen **OR** [DISCONTINUED] acetaminophen, alteplase, alum & mag hydroxide-simeth, heparin, lidocaine (PF), lidocaine-prilocaine, miconazole nitrate, ondansetron **OR** ondansetron (ZOFRAN) IV, pentafluoroprop-tetrafluoroeth, polyethylene glycol  Assessment/ Plan:  70 y.o.African American male with medical problems of Chronic respiratory failure with chronic  tracheostomy, legally blind in left eye due to glaucoma, hypertension, diabetes, who was admitted to [facility] on 05/01/2016 for evaluation of weakness, increased falls.   1.  Acute renal failure, likely ATN from concurrent sepsis/bacteremia U/a with TNTC RBC, WBC, protein, glucose, Billirubin Renal ultrasound is negative for obstruction/stone Baseline creatinine likely 0.89 from November 2014.  No other labs available for review  - The patient has improved significantly. BUN currently 22 with a cranial 1.69 and the patient has been off of dialysis. Therefore we will go ahead and  discontinue temporary dialysis catheter. Continue to monitor renal function daily.  2. Hyponatremia Serum sodium has normalized with dialysis. Currently serum sodium is 138.  3.  Thrombocytopenia Platelets also remained relatively stable at 214,000.  4.  Diabetes type 2 insulin-dependent Poor control Hb1C 10.2 %  5. Sepsis with Klebsiella Pneumonia - continue ceftriaxone 2 g IV daily.   LOS: 9 Dominic [name] 9/9/201710:59 AM

## 2016-05-10 NOTE — Plan of Care (Signed)
Problem: Pain Managment: Goal: General experience of comfort will improve Outcome: Progressing Pt complains of occassional pain in the feet.  Discussed possibility of peripheral neuropathy with Dr. Nemiah CommanderKalisetti.  Bradly Chrisougherty, Floy Angert E, RN

## 2016-05-11 LAB — GLUCOSE, CAPILLARY
GLUCOSE-CAPILLARY: 213 mg/dL — AB (ref 65–99)
GLUCOSE-CAPILLARY: 158 mg/dL — AB (ref 65–99)
GLUCOSE-CAPILLARY: 189 mg/dL — AB (ref 65–99)
Glucose-Capillary: 120 mg/dL — ABNORMAL HIGH (ref 65–99)

## 2016-05-11 LAB — TYPE AND SCREEN
ABO/RH(D): O POS
ANTIBODY SCREEN: NEGATIVE
Unit division: 0

## 2016-05-11 LAB — CBC
HEMATOCRIT: 26.5 % — AB (ref 40.0–52.0)
HEMOGLOBIN: 8.7 g/dL — AB (ref 13.0–18.0)
MCH: 24.1 pg — ABNORMAL LOW (ref 26.0–34.0)
MCHC: 33 g/dL (ref 32.0–36.0)
MCV: 73.2 fL — ABNORMAL LOW (ref 80.0–100.0)
Platelets: 229 10*3/uL (ref 150–440)
RBC: 3.62 MIL/uL — AB (ref 4.40–5.90)
RDW: 14.8 % — ABNORMAL HIGH (ref 11.5–14.5)
WBC: 16 10*3/uL — ABNORMAL HIGH (ref 3.8–10.6)

## 2016-05-11 MED ORDER — INSULIN GLARGINE 100 UNIT/ML ~~LOC~~ SOLN
8.0000 [IU] | Freq: Every day | SUBCUTANEOUS | Status: DC
  Administered 2016-05-11: 8 [IU] via SUBCUTANEOUS
  Filled 2016-05-11 (×2): qty 0.08

## 2016-05-11 NOTE — Care Management Important Message (Signed)
Important Message  Patient Details  Name: Dominic [name] MRN: [id number] Date of Birth: 08-10-1946   Medicare Important Message Given:  Yes    Corleen Otwell A, RN 05/11/2016, 4:55 PM

## 2016-05-11 NOTE — Progress Notes (Signed)
Subjective:  Patient seen at bedside. Hemoglobin up to 8.7. No new creatinine today. However urine output was 1080 over the preceding 24 hours.   Objective:  Vital signs in last 24 hours:  Temp:  [97.7 F (36.5 C)-98.4 F (36.9 C)] 98.2 F (36.8 C) (09/10 0454) Pulse Rate:  [61-77] 77 (09/10 0454) Resp:  [16-20] 20 (09/10 0454) BP: (123-176)/(63-73) 151/63 (09/10 0454) SpO2:  [99 %-100 %] 100 % (09/10 0739) FiO2 (%):  [28 %] 28 % (09/10 0739)  Weight change:  Filed Weights   05/07/16 1600 05/08/16 1011 05/08/16 1340  Weight: 108.1 kg (238 lb 5.1 oz) 108.3 kg (238 lb 12.1 oz) 107.8 kg (237 lb 10.5 oz)    Intake/Output:    Intake/Output Summary (Last 24 hours) at 05/11/16 1138 Last data filed at 05/11/16 0800  Gross per 24 hour  Intake              412 ml  Output             1020 ml  Net             -608 ml     Physical Exam: General: No acute distress, resting in bed  HEENT Left corneal opacity  Neck Tracheostomy in place with drainage  Pulm/lungs Coarse breath sounds bilaterally  CVS/Heart S1S2 no rubs  Abdomen:  Soft, mildly distended, nontender  Extremities: trace peripheral edema  Neurologic: Alert, awake, able to communicate  Skin: No acute rashes  Access: Right femoral dialysis catheter       Basic Metabolic Panel:   Recent Labs Lab 05/05/16 0621 05/06/16 0456 05/07/16 0437 05/07/16 1815 05/08/16 1010 05/09/16 0446 05/10/16 0606  NA 130* 130* 132*  --   --  139 138  K 3.9 4.0 3.6  --   --  3.6 3.6  CL 103 106 106  --   --  103 103  CO2 17* 16* 19*  --   --  30 30  GLUCOSE 134* 126* 98  --   --  124* 101*  BUN 87* 86* 69*  --   --  25* 22*  CREATININE 4.56* 4.32* 3.49*  --   --  1.72* 1.69*  CALCIUM 8.1* 8.0* 8.0*  --   --  8.1* 8.2*  PHOS  --   --   --  2.4* 3.2  --   --      CBC:  Recent Labs Lab 05/05/16 0621 05/06/16 0456 05/10/16 0606 05/11/16 0920  WBC 12.5* 15.3* 12.5* 16.0*  HGB 9.6* 8.6* 7.0* 8.7*  HCT 29.4* 26.6*  22.1* 26.5*  MCV 72.0* 74.3* 72.9* 73.2*  PLT 225 259 214 229      Microbiology:  Recent Results (from the past 720 hour(s))  CULTURE, BLOOD (ROUTINE X 2) w Reflex to ID Panel     Status: Abnormal   Collection Time: 05/01/16  4:24 PM  Result Value Ref Range Status   Specimen Description BLOOD RIGHT ANTECUBITAL  Final   Special Requests   Final    BOTTLES DRAWN AEROBIC AND ANAEROBIC  AER 6 ML ANA 3 ML   Culture  Setup Time   Final    GRAM NEGATIVE RODS IN BOTH AEROBIC AND ANAEROBIC BOTTLES CRITICAL RESULT CALLED TO, READ BACK BY AND VERIFIED WITH: CHRISTINE KATSOUDAS ON 05/02/16 AT 0744 BY QSD Performed at [facility]    Culture KLEBSIELLA PNEUMONIAE (A)  Final   Report Status 05/04/2016 FINAL  Final   Organism  ID, Bacteria KLEBSIELLA PNEUMONIAE  Final      Susceptibility   Klebsiella pneumoniae - MIC*    AMPICILLIN >=32 RESISTANT Resistant     CEFAZOLIN <=4 SENSITIVE Sensitive     CEFEPIME <=1 SENSITIVE Sensitive     CEFTAZIDIME <=1 SENSITIVE Sensitive     CEFTRIAXONE <=1 SENSITIVE Sensitive     CIPROFLOXACIN <=0.25 SENSITIVE Sensitive     GENTAMICIN <=1 SENSITIVE Sensitive     IMIPENEM <=0.25 SENSITIVE Sensitive     TRIMETH/SULFA <=20 SENSITIVE Sensitive     AMPICILLIN/SULBACTAM 4 SENSITIVE Sensitive     PIP/TAZO 16 SENSITIVE Sensitive     Extended ESBL NEGATIVE Sensitive     * KLEBSIELLA PNEUMONIAE  CULTURE, BLOOD (ROUTINE X 2) w Reflex to ID Panel     Status: Abnormal   Collection Time: 05/01/16  4:24 PM  Result Value Ref Range Status   Specimen Description BLOOD LEFT ANTECUBITAL  Final   Special Requests   Final    BOTTLES DRAWN AEROBIC AND ANAEROBIC  AER 6 ML ANA 6 ML   Culture  Setup Time   Final    GRAM NEGATIVE RODS IN BOTH AEROBIC AND ANAEROBIC BOTTLES CRITICAL VALUE NOTED.  VALUE IS CONSISTENT WITH PREVIOUSLY REPORTED AND CALLED VALUE.    Culture (A)  Final    KLEBSIELLA PNEUMONIAE SUSCEPTIBILITIES PERFORMED ON PREVIOUS CULTURE WITHIN THE LAST 5  DAYS. Performed at [facility]    Report Status 05/04/2016 FINAL  Final  Blood Culture ID Panel (Reflexed)     Status: Abnormal   Collection Time: 05/01/16  4:24 PM  Result Value Ref Range Status   Enterococcus species NOT DETECTED NOT DETECTED Final   Listeria monocytogenes NOT DETECTED NOT DETECTED Final   Staphylococcus species NOT DETECTED NOT DETECTED Final   Staphylococcus aureus NOT DETECTED NOT DETECTED Final   Streptococcus species NOT DETECTED NOT DETECTED Final   Streptococcus agalactiae NOT DETECTED NOT DETECTED Final   Streptococcus pneumoniae NOT DETECTED NOT DETECTED Final   Streptococcus pyogenes NOT DETECTED NOT DETECTED Final   Acinetobacter baumannii NOT DETECTED NOT DETECTED Final   Enterobacteriaceae species DETECTED (A) NOT DETECTED Final    Comment: CRITICAL RESULT CALLED TO, READ BACK BY AND VERIFIED WITH: CHRISTINE KATSOUDAS ON 05/02/16 AT 0744 QSD    Enterobacter cloacae complex NOT DETECTED NOT DETECTED Final   Escherichia coli NOT DETECTED NOT DETECTED Final   Klebsiella oxytoca NOT DETECTED NOT DETECTED Final   Klebsiella pneumoniae DETECTED (A) NOT DETECTED Final    Comment: CRITICAL RESULT CALLED TO, READ BACK BY AND VERIFIED WITH: CHRISTINE KATSOUDAS ON 05/02/16 AT 0744 QSD    Proteus species NOT DETECTED NOT DETECTED Final   Serratia marcescens NOT DETECTED NOT DETECTED Final   Carbapenem resistance NOT DETECTED NOT DETECTED Final   Haemophilus influenzae NOT DETECTED NOT DETECTED Final   Neisseria meningitidis NOT DETECTED NOT DETECTED Final   Pseudomonas aeruginosa NOT DETECTED NOT DETECTED Final   Candida albicans NOT DETECTED NOT DETECTED Final   Candida glabrata NOT DETECTED NOT DETECTED Final   Candida krusei NOT DETECTED NOT DETECTED Final   Candida parapsilosis NOT DETECTED NOT DETECTED Final   Candida tropicalis NOT DETECTED NOT DETECTED Final  Urine culture     Status: None   Collection Time: 05/02/16  1:12 PM  Result Value Ref  Range Status   Specimen Description URINE, RANDOM  Final   Special Requests NONE  Final   Culture NO GROWTH [facility]formed [facility]  Final   Report Status 05/04/2016 FINAL  Final  Culture, expectorated sputum-assessment     Status: None   Collection Time: 05/02/16  1:30 PM  Result Value Ref Range Status   Specimen Description EXPECTORATED SPUTUM  Final   Special Requests Normal  Final   Sputum evaluation THIS SPECIMEN IS ACCEPTABLE FOR SPUTUM CULTURE  Final   Report Status 05/02/2016 FINAL  Final  Culture, respiratory (NON-Expectorated)     Status: None   Collection Time: 05/02/16  1:30 PM  Result Value Ref Range Status   Specimen Description EXPECTORATED SPUTUM  Final   Special Requests Normal Reflexed from [id number]  Final   Gram Stain   Final    MODERATE WBC PRESENT, PREDOMINANTLY PMN MODERATE SQUAMOUS EPITHELIAL CELLS PRESENT ABUNDANT GRAM NEGATIVE RODS FEW GRAM POSITIVE COCCI FEW GRAM POSITIVE RODS Performed at [facility]    Culture MULTIPLE ORGANISMS PRESENT, NONE PREDOMINANT  Final   Report Status 05/04/2016 FINAL  Final  MRSA PCR Screening     Status: None   Collection Time: 05/03/16  4:48 PM  Result Value Ref Range Status   MRSA by PCR NEGATIVE NEGATIVE Final    Comment:        The GeneXpert MRSA Assay (FDA approved for NASAL specimens only), is one component of a comprehensive MRSA colonization surveillance program. It is not intended to diagnose MRSA infection nor to guide or monitor treatment for MRSA infections.     Coagulation Studies: No results for input(s): LABPROT, INR in the last 72 hours.  Urinalysis: No results for input(s): COLORURINE, LABSPEC, PHURINE, GLUCOSEU, HGBUR, BILIRUBINUR, KETONESUR, PROTEINUR, UROBILINOGEN, NITRITE, LEUKOCYTESUR in the last 72 hours.  Invalid input(s): APPERANCEUR    Imaging: No results found.   Medications:     . aspirin EC  81 mg Oral Daily  . brimonidine  1 drop Both Eyes Q12H   And   . timolol  1 drop Both Eyes Q12H  . budesonide (PULMICORT) nebulizer solution  0.25 mg Nebulization BID  . cefTRIAXone (ROCEPHIN)  IV  2 g Intravenous Q24H  . folic acid  1 mg Oral Daily  . heparin  5,000 Units Subcutaneous Q8H  . insulin aspart  0-15 Units Subcutaneous TID WC  . insulin aspart  0-5 Units Subcutaneous QHS  . insulin glargine  5 Units Subcutaneous QHS  . ipratropium-albuterol  3 mL Nebulization Q6H  . multivitamin with minerals  1 tablet Oral Daily  . scopolamine  1 patch Transdermal Q72H  . senna-docusate  2 tablet Oral Daily  . sodium chloride flush  3 mL Intravenous Q12H  . thiamine  100 mg Oral Daily   Or  . thiamine  100 mg Intravenous Daily   sodium chloride, acetaminophen **OR** [DISCONTINUED] acetaminophen, alteplase, alum & mag hydroxide-simeth, heparin, lidocaine (PF), lidocaine-prilocaine, miconazole nitrate, ondansetron **OR** ondansetron (ZOFRAN) IV, pentafluoroprop-tetrafluoroeth, polyethylene glycol  Assessment/ Plan:  70 y.o.African American male with medical problems of Chronic respiratory failure with chronic  tracheostomy, legally blind in left eye due to glaucoma, hypertension, diabetes, who was admitted to [facility] on 05/01/2016 for evaluation of weakness, increased falls.   1.  Acute renal failure, likely ATN from concurrent sepsis/bacteremia U/a with TNTC RBC, WBC, protein, glucose, Billirubin Renal ultrasound is negative for obstruction/stone Baseline creatinine likely 0.89 from November 2014.  No other labs available for review  - Femoral dialysis catheter was not removed yesterday as hospitals requested that it be left in place for IV access. Recommend that this be removed prior  to discharge. Creatinine has come down to 1.69. Reasonable urine output noted. No further indication for dialysis.  2. Hyponatremia No new serum sodium today. Last serum sodium was 138.  3.  Thrombocytopenia Platelets improved to 229,000.  4.  Diabetes type 2  insulin-dependent Poor control Hb1C 10.2 %  5. Sepsis with Klebsiella Pneumonia - continue ceftriaxone 2 g IV daily.   LOS: 10 Mariangel Ringley 9/10/201711:38 AM

## 2016-05-11 NOTE — Progress Notes (Signed)
[facility] [facility] [place] [facility]   PATIENT NAME: Dominic [name]    MR#:  [id number]  DATE OF BIRTH:  Sep 19, 1945  SUBJECTIVE:  CHIEF COMPLAINT:   Chief Complaint  Patient presents with  . Weakness   - feels better, received 1unit PRBC Tx yesterday - improved tracheal secretions  REVIEW OF SYSTEMS:  Review of Systems  Constitutional: Positive for malaise/fatigue. Negative for chills and fever.  Eyes: Positive for blurred vision.  Respiratory: Positive for cough and shortness of breath. Negative for wheezing.   Cardiovascular: Positive for leg swelling. Negative for chest pain and palpitations.  Gastrointestinal: Negative for abdominal pain, constipation, diarrhea, nausea and vomiting.  Genitourinary: Negative for dysuria.  Musculoskeletal: Negative for back pain and myalgias.  Neurological: Negative for dizziness, seizures and headaches.    DRUG ALLERGIES:   Allergies  Allergen Reactions  . Other     Oatmeal grits  . Penicillins Hives, Rash and Other (See Comments)    Has patient had a PCN reaction causing immediate rash, facial/tongue/throat swelling, SOB or lightheadedness with hypotension: possibly. Pt isn't sure Has patient had a PCN reaction causing severe rash involving mucus membranes or skin necrosis: No Has patient had a PCN reaction that required hospitalization No Has patient had a PCN reaction occurring within the last 10 years: No If all of the above answers are "NO", then may proceed with Cephalosporin use.     VITALS:  Blood pressure (!) 151/63, pulse 77, temperature 98.2 F (36.8 C), temperature source Oral, resp. rate 20, height 5\' 11"  (1.803 m), weight 107.8 kg (237 lb 10.5 oz), SpO2 100 %.  PHYSICAL EXAMINATION:  Physical Exam  GENERAL:  70 y.o.-year-old patient lying in the bed with no acute distress.  EYES: right Pupil round, reactive to light and accommodation. Left cornea is opaque, No scleral icterus. Extraocular muscles  intact.  HEENT: Head atraumatic, normocephalic. Oropharynx and nasopharynx clear.  NECK:  Supple, no jugular venous distention. No thyroid enlargement, no tenderness. Trach in place and creamish discharge oozing through it, on trach collar LUNGS: Normal breath sounds bilaterally, no wheezing, rales,rhonchi or crepitation. No use of accessory muscles of respiration. Decreased bibasilar breath sounds CARDIOVASCULAR: S1, S2 normal. No murmurs, rubs, or gallops.  ABDOMEN: Soft, obese,  nontender, nondistended. Bowel sounds present. No organomegaly or mass.  EXTREMITIES: No pedal edema, cyanosis, or clubbing.  NEUROLOGIC: Cranial nerves II through XII are intact. Muscle strength 5/5 in all extremities. Sensation intact. Gait not checked. Global weakness noted. PSYCHIATRIC: The patient is alert and oriented x 3.  SKIN: No obvious rash, lesion, or ulcer.     LABORATORY PANEL:   CBC  Recent Labs Lab 05/11/16 0920  WBC 16.0*  HGB 8.7*  HCT 26.5*  PLT 229   ------------------------------------------------------------------------------------------------------------------  Chemistries   Recent Labs Lab 05/05/16 0621  05/10/16 0606  NA 130*  < > 138  K 3.9  < > 3.6  CL 103  < > 103  CO2 17*  < > 30  GLUCOSE 134*  < > 101*  BUN 87*  < > 22*  CREATININE 4.56*  < > 1.69*  CALCIUM 8.1*  < > 8.2*  AST 25  --   --   ALT 21  --   --   ALKPHOS 359*  --   --   BILITOT 4.9*  --   --   < > = values in this interval not displayed. ------------------------------------------------------------------------------------------------------------------  Cardiac Enzymes No results for input(s):  TROPONINI in the last 168 hours. ------------------------------------------------------------------------------------------------------------------  RADIOLOGY:  No results found.  EKG:   Orders placed or performed during the hospital encounter of 05/01/16  . ED EKG  . ED EKG  . EKG 12-Lead  . EKG  12-Lead    ASSESSMENT AND PLAN:   Whitman HeroJohn Fichter  is a 70 y.o. 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  #1 severe sepsis with multiorgan failure-blood cultures growing Klebsiella. Continue Rocephin for a total of 14 days. -Received pressors due to hypotension on admission, currently off of pressors.  #2 acute renal failure- ATN from sepsis, and rhabdomyolysis  - off IV fluids. Received 3 sessions of hemodialysis.  urine output is improved, creatinine is improving -no further need for dialysis. Continue to monitor - Continue monitoring urine output. hold nephrotoxins - renal US with no obstruction. Appreciate nephrology consult  #3 type 2 diabetes mellitus-currently on Lantus and sliding scale insulin. hba1c of 10.2  #4 hypertension- on low dose norvasc  #5 chronic respiratory failure-status post chronic trach. Patient on room air at home, currently requiring trach collar at 28% FiO2. -Increased secretions to rule out trach, more serous and whitish phlegm. added scopolamine. - CXR with bibasilar atelectasis  #6 acute on chronic anemia-slow drop in hemoglobin since admission. No active bleeding. -s/p 1 unit packed RBC transfusion and hb at 8.5.Marland Kitchen.  #7 DVT Prophylaxis- Heparin SQ  Likely discharge to rehabilitation early next week.    All the records are reviewed and case discussed with Care Management/Social Workerr. Management plans discussed with the patient, family and they are in agreement.  CODE STATUS: Full Code  TOTAL TIME TAKING CARE OF THIS PATIENT: 37 minutes.   POSSIBLE D/C IN 2 DAYS, DEPENDING ON CLINICAL CONDITION.   Enid BaasKALISETTI,Bevin Das M.D on 05/11/2016 at 12:34 PM  Between 7am to 6pm - Pager - [phone]  After 6pm go to [email or web address] -  Social research officer, governmentpassword EPAS [facility]  [facility]  Office  [phone]  CC: Primary care physician; No primary care provider on file.

## 2016-05-12 LAB — PROTEIN ELECTROPHORESIS, SERUM
A/G RATIO SPE: 0.6 — AB (ref 0.7–1.7)
ALBUMIN ELP: 2.4 g/dL — AB (ref 2.9–4.4)
ALPHA-1-GLOBULIN: 0.4 g/dL (ref 0.0–0.4)
ALPHA-2-GLOBULIN: 0.8 g/dL (ref 0.4–1.0)
Beta Globulin: 1.1 g/dL (ref 0.7–1.3)
GLOBULIN, TOTAL: 4 g/dL — AB (ref 2.2–3.9)
Gamma Globulin: 1.6 g/dL (ref 0.4–1.8)
Total Protein ELP: 6.4 g/dL (ref 6.0–8.5)

## 2016-05-12 LAB — BASIC METABOLIC PANEL
ANION GAP: 6 (ref 5–15)
BUN: 16 mg/dL (ref 6–20)
CHLORIDE: 99 mmol/L — AB (ref 101–111)
CO2: 34 mmol/L — ABNORMAL HIGH (ref 22–32)
Calcium: 8.3 mg/dL — ABNORMAL LOW (ref 8.9–10.3)
Creatinine, Ser: 1.18 mg/dL (ref 0.61–1.24)
Glucose, Bld: 148 mg/dL — ABNORMAL HIGH (ref 65–99)
POTASSIUM: 3.8 mmol/L (ref 3.5–5.1)
SODIUM: 139 mmol/L (ref 135–145)

## 2016-05-12 LAB — GLUCOSE, CAPILLARY
Glucose-Capillary: 126 mg/dL — ABNORMAL HIGH (ref 65–99)
Glucose-Capillary: 128 mg/dL — ABNORMAL HIGH (ref 65–99)

## 2016-05-12 LAB — CBC
HCT: 26.6 % — ABNORMAL LOW (ref 40.0–52.0)
Hemoglobin: 8.7 g/dL — ABNORMAL LOW (ref 13.0–18.0)
MCH: 23.8 pg — ABNORMAL LOW (ref 26.0–34.0)
MCHC: 32.6 g/dL (ref 32.0–36.0)
MCV: 72.9 fL — ABNORMAL LOW (ref 80.0–100.0)
Platelets: 203 10*3/uL (ref 150–440)
RBC: 3.64 MIL/uL — ABNORMAL LOW (ref 4.40–5.90)
RDW: 15.4 % — ABNORMAL HIGH (ref 11.5–14.5)
WBC: 14.4 10*3/uL — ABNORMAL HIGH (ref 3.8–10.6)

## 2016-05-12 LAB — GLOMERULAR BASEMENT MEMBRANE ANTIBODIES: GBM Ab: 6 units (ref 0–20)

## 2016-05-12 MED ORDER — AMLODIPINE BESYLATE 5 MG PO TABS: 5.0000 mg | ORAL_TABLET | Freq: Every day | ORAL | 0 refills | Status: DC

## 2016-05-12 MED ORDER — LIDOCAINE-PRILOCAINE 2.5-2.5 % EX CREA: 1.0000 "application " | TOPICAL_CREAM | CUTANEOUS | 0 refills | Status: DC | PRN

## 2016-05-12 MED ORDER — ACETAMINOPHEN 325 MG PO TABS: 650.0000 mg | ORAL_TABLET | Freq: Four times a day (QID) | ORAL | 0 refills | Status: DC | PRN

## 2016-05-12 MED ORDER — GUAIFENESIN-CODEINE 100-10 MG/5ML PO SOLN: 10.0000 mL | Freq: Four times a day (QID) | ORAL | 0 refills | Status: DC | PRN

## 2016-05-12 MED ORDER — CEFUROXIME AXETIL 500 MG PO TABS: 500.0000 mg | ORAL_TABLET | Freq: Two times a day (BID) | ORAL | 0 refills | Status: DC

## 2016-05-12 MED ORDER — POLYETHYLENE GLYCOL 3350 17 G PO PACK: 17.0000 g | PACK | Freq: Every day | ORAL | 0 refills | Status: DC | PRN

## 2016-05-12 MED ORDER — CEPHALEXIN 500 MG PO CAPS: 500.0000 mg | ORAL_CAPSULE | Freq: Three times a day (TID) | ORAL | 0 refills | Status: DC

## 2016-05-12 MED ORDER — SCOPOLAMINE 1 MG/3DAYS TD PT72: 1.0000 | MEDICATED_PATCH | TRANSDERMAL | 12 refills | Status: DC

## 2016-05-12 MED ORDER — BUDESONIDE 0.25 MG/2ML IN SUSP: 0.2500 mg | Freq: Two times a day (BID) | RESPIRATORY_TRACT | 12 refills | Status: DC

## 2016-05-12 MED ORDER — FOLIC ACID 1 MG PO TABS: 1.0000 mg | ORAL_TABLET | Freq: Every day | ORAL | 2 refills | Status: DC

## 2016-05-12 MED ORDER — INSULIN GLARGINE 100 UNIT/ML ~~LOC~~ SOLN: 8.0000 [IU] | Freq: Every day | SUBCUTANEOUS | 11 refills | Status: DC

## 2016-05-12 NOTE — Progress Notes (Signed)
Subjective:   Creatinine back to baseline  Objective:  Vital signs in last 24 hours:  Temp:  [98.2 F (36.8 C)-98.5 F (36.9 C)] 98.2 F (36.8 C) (09/11 0502) Pulse Rate:  [63-64] 64 (09/11 0502) Resp:  [17-20] 17 (09/11 0502) BP: (135-164)/(57-73) 153/57 (09/11 0502) SpO2:  [100 %] 100 % (09/11 0734) FiO2 (%):  [28 %] 28 % (09/11 0734)  Weight change:  Filed Weights   05/07/16 1600 05/08/16 1011 05/08/16 1340  Weight: 108.1 kg (238 lb 5.1 oz) 108.3 kg (238 lb 12.1 oz) 107.8 kg (237 lb 10.5 oz)    Intake/Output:    Intake/Output Summary (Last 24 hours) at 05/12/16 1258 Last data filed at 05/12/16 1201  Gross per 24 hour  Intake              830 ml  Output             2851 ml  Net            -2021 ml     Physical Exam: General: No acute distress, resting in bed  HEENT Left corneal opacity  Neck Tracheostomy   Pulm/lungs Coarse breath sounds bilaterally  CVS/Heart S1S2 no rubs  Abdomen:  Soft, mildly distended, nontender  Extremities: trace peripheral edema  Neurologic: Alert, awake, able to communicate  Skin: No acute rashes  Access: Right femoral dialysis catheter       Basic Metabolic Panel:   Recent Labs Lab 05/06/16 0456 05/07/16 0437 05/07/16 1815 05/08/16 1010 05/09/16 0446 05/10/16 0606 05/12/16 0442  NA 130* 132*  --   --  139 138 139  K 4.0 3.6  --   --  3.6 3.6 3.8  CL 106 106  --   --  103 103 99*  CO2 16* 19*  --   --  30 30 34*  GLUCOSE 126* 98  --   --  124* 101* 148*  BUN 86* 69*  --   --  25* 22* 16  CREATININE 4.32* 3.49*  --   --  1.72* 1.69* 1.18  CALCIUM 8.0* 8.0*  --   --  8.1* 8.2* 8.3*  PHOS  --   --  2.4* 3.2  --   --   --      CBC:  Recent Labs Lab 05/06/16 0456 05/10/16 0606 05/11/16 0920 05/12/16 0442  WBC 15.3* 12.5* 16.0* 14.4*  HGB 8.6* 7.0* 8.7* 8.7*  HCT 26.6* 22.1* 26.5* 26.6*  MCV 74.3* 72.9* 73.2* 72.9*  PLT 259 214 229 203      Microbiology:  Recent Results (from the past 720 hour(s))   CULTURE, BLOOD (ROUTINE X 2) w Reflex to ID Panel     Status: Abnormal   Collection Time: 05/01/16  4:24 PM  Result Value Ref Range Status   Specimen Description BLOOD RIGHT ANTECUBITAL  Final   Special Requests   Final    BOTTLES DRAWN AEROBIC AND ANAEROBIC  AER 6 ML ANA 3 ML   Culture  Setup Time   Final    GRAM NEGATIVE RODS IN BOTH AEROBIC AND ANAEROBIC BOTTLES CRITICAL RESULT CALLED TO, READ BACK BY AND VERIFIED WITH: CHRISTINE KATSOUDAS ON 05/02/16 AT 0744 BY QSD Performed at [facility]    Culture KLEBSIELLA PNEUMONIAE (A)  Final   Report Status 05/04/2016 FINAL  Final   Organism ID, Bacteria KLEBSIELLA PNEUMONIAE  Final      Susceptibility   Klebsiella pneumoniae - MIC*    AMPICILLIN >=32  RESISTANT Resistant     CEFAZOLIN <=4 SENSITIVE Sensitive     CEFEPIME <=1 SENSITIVE Sensitive     CEFTAZIDIME <=1 SENSITIVE Sensitive     CEFTRIAXONE <=1 SENSITIVE Sensitive     CIPROFLOXACIN <=0.25 SENSITIVE Sensitive     GENTAMICIN <=1 SENSITIVE Sensitive     IMIPENEM <=0.25 SENSITIVE Sensitive     TRIMETH/SULFA <=20 SENSITIVE Sensitive     AMPICILLIN/SULBACTAM 4 SENSITIVE Sensitive     PIP/TAZO 16 SENSITIVE Sensitive     Extended ESBL NEGATIVE Sensitive     * KLEBSIELLA PNEUMONIAE  CULTURE, BLOOD (ROUTINE X 2) w Reflex to ID Panel     Status: Abnormal   Collection Time: 05/01/16  4:24 PM  Result Value Ref Range Status   Specimen Description BLOOD LEFT ANTECUBITAL  Final   Special Requests   Final    BOTTLES DRAWN AEROBIC AND ANAEROBIC  AER 6 ML ANA 6 ML   Culture  Setup Time   Final    GRAM NEGATIVE RODS IN BOTH AEROBIC AND ANAEROBIC BOTTLES CRITICAL VALUE NOTED.  VALUE IS CONSISTENT WITH PREVIOUSLY REPORTED AND CALLED VALUE.    Culture (A)  Final    KLEBSIELLA PNEUMONIAE SUSCEPTIBILITIES PERFORMED ON PREVIOUS CULTURE WITHIN THE LAST 5 DAYS. Performed at [facility]    Report Status 05/04/2016 FINAL  Final  Blood Culture ID Panel (Reflexed)     Status:  Abnormal   Collection Time: 05/01/16  4:24 PM  Result Value Ref Range Status   Enterococcus species NOT DETECTED NOT DETECTED Final   Listeria monocytogenes NOT DETECTED NOT DETECTED Final   Staphylococcus species NOT DETECTED NOT DETECTED Final   Staphylococcus aureus NOT DETECTED NOT DETECTED Final   Streptococcus species NOT DETECTED NOT DETECTED Final   Streptococcus agalactiae NOT DETECTED NOT DETECTED Final   Streptococcus pneumoniae NOT DETECTED NOT DETECTED Final   Streptococcus pyogenes NOT DETECTED NOT DETECTED Final   Acinetobacter baumannii NOT DETECTED NOT DETECTED Final   Enterobacteriaceae species DETECTED (A) NOT DETECTED Final    Comment: CRITICAL RESULT CALLED TO, READ BACK BY AND VERIFIED WITH: CHRISTINE KATSOUDAS ON 05/02/16 AT 0744 QSD    Enterobacter cloacae complex NOT DETECTED NOT DETECTED Final   Escherichia coli NOT DETECTED NOT DETECTED Final   Klebsiella oxytoca NOT DETECTED NOT DETECTED Final   Klebsiella pneumoniae DETECTED (A) NOT DETECTED Final    Comment: CRITICAL RESULT CALLED TO, READ BACK BY AND VERIFIED WITH: CHRISTINE KATSOUDAS ON 05/02/16 AT 0744 QSD    Proteus species NOT DETECTED NOT DETECTED Final   Serratia marcescens NOT DETECTED NOT DETECTED Final   Carbapenem resistance NOT DETECTED NOT DETECTED Final   Haemophilus influenzae NOT DETECTED NOT DETECTED Final   Neisseria meningitidis NOT DETECTED NOT DETECTED Final   Pseudomonas aeruginosa NOT DETECTED NOT DETECTED Final   Candida albicans NOT DETECTED NOT DETECTED Final   Candida glabrata NOT DETECTED NOT DETECTED Final   Candida krusei NOT DETECTED NOT DETECTED Final   Candida parapsilosis NOT DETECTED NOT DETECTED Final   Candida tropicalis NOT DETECTED NOT DETECTED Final  Urine culture     Status: None   Collection Time: 05/02/16  1:12 PM  Result Value Ref Range Status   Specimen Description URINE, RANDOM  Final   Special Requests NONE  Final   Culture NO GROWTH Performed at [facility]   Final   Report Status 05/04/2016 FINAL  Final  Culture, expectorated sputum-assessment     Status: None   Collection Time:  05/02/16  1:30 PM  Result Value Ref Range Status   Specimen Description EXPECTORATED SPUTUM  Final   Special Requests Normal  Final   Sputum evaluation THIS SPECIMEN IS ACCEPTABLE FOR SPUTUM CULTURE  Final   Report Status 05/02/2016 FINAL  Final  Culture, respiratory (NON-Expectorated)     Status: None   Collection Time: 05/02/16  1:30 PM  Result Value Ref Range Status   Specimen Description EXPECTORATED SPUTUM  Final   Special Requests Normal Reflexed from [id number]  Final   Gram Stain   Final    MODERATE WBC PRESENT, PREDOMINANTLY PMN MODERATE SQUAMOUS EPITHELIAL CELLS PRESENT ABUNDANT GRAM NEGATIVE RODS FEW GRAM POSITIVE COCCI FEW GRAM POSITIVE RODS Performed at [facility]    Culture MULTIPLE ORGANISMS PRESENT, NONE PREDOMINANT  Final   Report Status 05/04/2016 FINAL  Final  MRSA PCR Screening     Status: None   Collection Time: 05/03/16  4:48 PM  Result Value Ref Range Status   MRSA by PCR NEGATIVE NEGATIVE Final    Comment:        The GeneXpert MRSA Assay (FDA approved for NASAL specimens only), is one component of a comprehensive MRSA colonization surveillance program. It is not intended to diagnose MRSA infection nor to guide or monitor treatment for MRSA infections.     Coagulation Studies: No results for input(s): LABPROT, INR in the last 72 hours.  Urinalysis: No results for input(s): COLORURINE, LABSPEC, PHURINE, GLUCOSEU, HGBUR, BILIRUBINUR, KETONESUR, PROTEINUR, UROBILINOGEN, NITRITE, LEUKOCYTESUR in the last 72 hours.  Invalid input(s): APPERANCEUR    Imaging: No results found.   Medications:     . aspirin EC  81 mg Oral Daily  . brimonidine  1 drop Both Eyes Q12H   And  . timolol  1 drop Both Eyes Q12H  . budesonide (PULMICORT) nebulizer solution  0.25 mg Nebulization BID  . cefTRIAXone (ROCEPHIN)   IV  2 g Intravenous Q24H  . folic acid  1 mg Oral Daily  . heparin  5,000 Units Subcutaneous Q8H  . insulin aspart  0-15 Units Subcutaneous TID WC  . insulin aspart  0-5 Units Subcutaneous QHS  . insulin glargine  8 Units Subcutaneous QHS  . ipratropium-albuterol  3 mL Nebulization Q6H  . multivitamin with minerals  1 tablet Oral Daily  . scopolamine  1 patch Transdermal Q72H  . senna-docusate  2 tablet Oral Daily  . sodium chloride flush  3 mL Intravenous Q12H  . thiamine  100 mg Oral Daily   Or  . thiamine  100 mg Intravenous Daily   acetaminophen **OR** [DISCONTINUED] acetaminophen, alteplase, alum & mag hydroxide-simeth, heparin, lidocaine (PF), lidocaine-prilocaine, miconazole nitrate, ondansetron **OR** ondansetron (ZOFRAN) IV, pentafluoroprop-tetrafluoroeth, polyethylene glycol  Assessment/ Plan:  70 y.o.Black male with medical problems of Chronic respiratory failure with chronic tracheostomy, legally blind in left eye due to glaucoma, hypertension, diabetes, who was admitted to [facility] on 05/01/2016 for evaluation of weakness, increased falls.   1.  Acute renal failure, ATN from concurrent sepsis/bacteremia Baseline creatinine likely 0.89 from November 2014.  No acute indication for dialysis.  - remove dialysis catheter today  2. Hyponatremia: improved  3.  Anemia with renal failure: microcytic. Hemoglobin 8.7. PRBC transfusion on 9/9  4.  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   5. Sepsis with Klebsiella Pneumonia - PO antibiotics.    LOS: 11 Dominic [name] 9/11/201712:58 PM

## 2016-05-12 NOTE — Progress Notes (Signed)
[facility] with [facility], [facility] at [phone], to notify of non-emergent EMS transport.  Auth notification reference given as [id number].   Service date range good from 05/12/16 - 08/10/16.   Geographical gap exception requested to determine if services can be considered at an in-network level.

## 2016-05-12 NOTE — Progress Notes (Addendum)
Patient is medically stable for [facility]. Per Gavin Poundeborah admissions coordinator [facility]. RN will call report to [facility] and arrange EMS for transport. Clinical Child psychotherapistocial Worker (CSW) sent D/C orders to [employer] via [employer]. Patient is aware of above. CSW left patient's daughter [name] a Engineer, technical salesvoicemail. Please reconsult if future social work needs arise. CSW signing off.   Patient's daughter [name] called CSW back and was made aware of above.   [employer]iley Da [employer], LCSW [phone]

## 2016-05-12 NOTE — Discharge Summary (Addendum)
[employer] - [place] [facility]   PATIENT NAME: Dominic [name]    MR#:  [id number]  DATE OF BIRTH:  [birth year]  DATE OF ADMISSION:  05/01/2016   ADMITTING PHYSICIAN: Enid Baas, MD  DATE OF DISCHARGE:05/12/16  PRIMARY CARE PHYSICIAN: No primary care provider on file.   ADMISSION DIAGNOSIS:   Hyponatremia [E87.1] ARF (acute renal failure) (HCC) [N17.9] Hyperglycemia [R73.9] Hypoxia [R09.02] AKI (acute kidney injury) (HCC) [N17.9] Non-traumatic rhabdomyolysis [M62.82]  DISCHARGE DIAGNOSIS:   Active Problems:   Sepsis (HCC)   SECONDARY DIAGNOSIS:   Past Medical History:  Diagnosis Date  . A-fib (HCC)   . Chronic respiratory failure (HCC)    s/p trach, on room air  . Diabetes mellitus without complication (HCC)    Not on medications  . Glaucoma   . Hypertension   . Legal blindness of left eye, as defined in [place]   . Tobacco use     HOSPITAL COURSE:   Dominic [name] a 70 y.o. 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  #1 severe sepsis with multiorgan failure-blood cultures growing Klebsiella pneumoniae. Received Rocephin in the hospital, will discharge on oral ABX for a total of 14 days. -Received pressors due to hypotension on admission, currently off of pressors.  #2 acute renal failure- ATN from sepsis, and rhabdomyolysis  - off IV fluids. Received 3 sessions of hemodialysis.  urine output is improved, creatinine is normalized. -no further need for dialysis. Remove dialysis catheter. Continue to monitor - renal US with no obstruction. Appreciate nephrology consult  #3 type 2 diabetes mellitus-currently on Lantus and sliding scale insulin. hba1c of 10.2 - sugars better  #4hypertension- on low dose  norvasc  #5 chronic respiratory failure-status post chronic trach. Patient on room air at home, currently requiring trach collar at 28% FiO2. - wean as tolerated -improved secretions to rule out trach, more serous and whitish phlegm. added scopolamine. - added cough meds - CXR with bibasilar atelectasis  #6 acute on chronic anemia-slow drop in hemoglobin since admission. No active bleeding. -s/p 1 unit packed RBC transfusion and hb at 8.5..  For discharge today  DISCHARGE CONDITIONS:   Guarded  CONSULTS OBTAINED:   Treatment Team:  Dominic Pigeon, MD Dominic Dills, MD  DRUG ALLERGIES:   Allergies  Allergen Reactions  . Other     Oatmeal grits  . Penicillins Hives, Rash and Other (See Comments)    Has patient had a PCN reaction causing immediate rash, facial/tongue/throat swelling, SOB or lightheadedness with hypotension: possibly. Pt isn't sure Has patient had a PCN reaction causing severe rash involving mucus membranes or skin necrosis: No Has patient had a PCN reaction that required hospitalization No Has patient had a PCN reaction occurring within the last 10 years: No If all of the above answers are "NO", then may proceed with Cephalosporin use.    DISCHARGE MEDICATIONS:     Medication List    STOP taking these medications   loratadine 10 MG tablet Commonly known as:  CLARITIN     TAKE these medications   acetaminophen 325 MG tablet Commonly known as:  TYLENOL Take 2 tablets (650 mg total) by mouth every 6 (six) hours as needed for mild pain (or Fever >/= 101).   albuterol 108 (90 Base) MCG/ACT inhaler Commonly  known as:  PROVENTIL HFA;VENTOLIN HFA Inhale into the lungs every 6 (six) hours as needed for wheezing or shortness of breath.   amLODipine 5 MG tablet Commonly known as:  NORVASC Take 1 tablet (5 mg total) by mouth daily. What changed:  medication strength  how much to take   aspirin 81 MG chewable tablet Take 81 mg by mouth daily.    budesonide 0.25 MG/2ML nebulizer solution Commonly known as:  PULMICORT Take 2 mLs (0.25 mg total) by nebulization 2 (two) times daily.   cephALEXin 500 MG capsule Commonly known as:  KEFLEX Take 1 capsule (500 mg total) by mouth 3 (three) times daily. X 7 more days starting 05/13/16   COMBIGAN 0.2-0.5 % ophthalmic solution Generic drug:  brimonidine-timolol Place 1 drop into both eyes every 12 (twelve) hours.   esomeprazole 40 MG capsule Commonly known as:  NEXIUM Take 40 mg by mouth daily at 12 noon.   fluticasone 50 MCG/ACT nasal spray Commonly known as:  FLONASE Place 1 spray into both nostrils daily.   folic acid 1 MG tablet Commonly known as:  FOLVITE Take 1 tablet (1 mg total) by mouth daily.   guaiFENesin-codeine 100-10 MG/5ML syrup Take 10 mLs by mouth every 6 (six) hours as needed for cough.   insulin glargine 100 UNIT/ML injection Commonly known as:  LANTUS Inject 0.08 mLs (8 Units total) into the skin at bedtime.   lidocaine-prilocaine cream Commonly known as:  EMLA Apply 1 application topically as needed (topical anesthesia for hemodialysis if Gebauers and Lidocaine injection are ineffective.).   polyethylene glycol packet Commonly known as:  MIRALAX / GLYCOLAX Take 17 g by mouth daily as needed for mild constipation.   scopolamine 1 MG/3DAYS Commonly known as:  TRANSDERM-SCOP Place 1 patch (1.5 mg total) onto the skin every 3 (three) days.   simvastatin 20 MG tablet Commonly known as:  ZOCOR Take 20 mg by mouth daily.        DISCHARGE INSTRUCTIONS:   1. PCP f/u in 1-2 weeks  DIET:   Cardiac diet- cannot eat dry food- please send soup on all trays  ACTIVITY:   Activity as tolerated  OXYGEN:   Home Oxygen: Yes.    Oxygen Delivery: trach mask 28%  DISCHARGE LOCATION:   nursing home   If you experience worsening of your admission symptoms, develop shortness of breath, life threatening emergency, suicidal or homicidal thoughts you must  seek medical attention immediately by calling 911 or calling your MD immediately  if symptoms less severe.  You Must read complete instructions/literature along with all the possible adverse reactions/side effects for all the Medicines you take and that have been prescribed to you. Take any new Medicines after you have completely understood and accpet all the possible adverse reactions/side effects.   Please note  You were cared for by a hospitalist during your hospital stay. If you have any questions about your discharge medications or the care you received while you were in the hospital after you are discharged, you can call the unit and asked to speak with the hospitalist on call if the hospitalist that took care of you is not available. Once you are discharged, your primary care physician will handle any further medical issues. Please note that NO REFILLS for any discharge medications will be authorized once you are discharged, as it is imperative that you return to your primary care physician (or establish a relationship with a primary care physician if you do not have one)  for your aftercare needs so that they can reassess your need for medications and monitor your lab values.    On the day of Discharge:  VITAL SIGNS:   Blood pressure (!) 153/57, pulse 64, temperature 98.2 F (36.8 C), temperature source Oral, resp. rate 17, height 5\' 11"  (1.803 m), weight 107.8 kg (237 lb 10.5 oz), SpO2 100 %.  PHYSICAL EXAMINATION:   GENERAL: 70 y.o.-year-old patient lying in the bed with no acute distress.  EYES: right Pupil round, reactive to light and accommodation. Left cornea is opaque,No scleral icterus. Extraocular muscles intact.  HEENT: Head atraumatic, normocephalic. Oropharynx and nasopharynx clear.  NECK: Supple, no jugular venous distention. No thyroid enlargement, no tenderness. Trach in place and less discharge today, patient able to talk closing his stoma, on trach collar LUNGS:  coarse breath sounds bilaterally, no wheezing, rales,rhonchi or crepitation. No use of accessory muscles of respiration. Decreased bibasilar breath sounds CARDIOVASCULAR: S1, S2 normal. No murmurs, rubs, or gallops.  ABDOMEN: Soft, obese, nontender, nondistended. Bowel sounds present. No organomegaly or mass.  EXTREMITIES: No pedal edema, cyanosis, or clubbing.  NEUROLOGIC: Cranial nerves II through XII are intact. Muscle strength 5/5 in all extremities. Sensation intact. Gait not checked. Global weakness noted. PSYCHIATRIC: The patient is alert and oriented x 3.  SKIN: No obvious rash, lesion, or ulcer.      DATA REVIEW:   CBC  Recent Labs Lab 05/12/16 0442  WBC 14.4*  HGB 8.7*  HCT 26.6*  PLT 203    Chemistries   Recent Labs Lab 05/12/16 0442  NA 139  K 3.8  CL 99*  CO2 34*  GLUCOSE 148*  BUN 16  CREATININE 1.18  CALCIUM 8.3*     Microbiology Results  Results for orders placed or performed during the hospital encounter of 05/01/16  CULTURE, BLOOD (ROUTINE X 2) w Reflex to ID Panel     Status: Abnormal   Collection Time: 05/01/16  4:24 PM  Result Value Ref Range Status   Specimen Description BLOOD RIGHT ANTECUBITAL  Final   Special Requests   Final    BOTTLES DRAWN AEROBIC AND ANAEROBIC  AER 6 ML ANA 3 ML   Culture  Setup Time   Final    GRAM NEGATIVE RODS IN BOTH AEROBIC AND ANAEROBIC BOTTLES CRITICAL RESULT CALLED TO, READ BACK BY AND VERIFIED WITH: CHRISTINE KATSOUDAS ON 05/02/16 AT 0744 BY QSD Performed at [facility]    Culture KLEBSIELLA PNEUMONIAE (A)  Final   Report Status 05/04/2016 FINAL  Final   Organism ID, Bacteria KLEBSIELLA PNEUMONIAE  Final      Susceptibility   Klebsiella pneumoniae - MIC*    AMPICILLIN >=32 RESISTANT Resistant     CEFAZOLIN <=4 SENSITIVE Sensitive     CEFEPIME <=1 SENSITIVE Sensitive     CEFTAZIDIME <=1 SENSITIVE Sensitive     CEFTRIAXONE <=1 SENSITIVE Sensitive     CIPROFLOXACIN <=0.25 SENSITIVE Sensitive      GENTAMICIN <=1 SENSITIVE Sensitive     IMIPENEM <=0.25 SENSITIVE Sensitive     TRIMETH/SULFA <=20 SENSITIVE Sensitive     AMPICILLIN/SULBACTAM 4 SENSITIVE Sensitive     PIP/TAZO 16 SENSITIVE Sensitive     Extended ESBL NEGATIVE Sensitive     * KLEBSIELLA PNEUMONIAE  CULTURE, BLOOD (ROUTINE X 2) w Reflex to ID Panel     Status: Abnormal   Collection Time: 05/01/16  4:24 PM  Result Value Ref Range Status   Specimen Description BLOOD LEFT ANTECUBITAL  Final  Special Requests   Final    BOTTLES DRAWN AEROBIC AND ANAEROBIC  AER 6 ML ANA 6 ML   Culture  Setup Time   Final    GRAM NEGATIVE RODS IN BOTH AEROBIC AND ANAEROBIC BOTTLES CRITICAL VALUE NOTED.  VALUE IS CONSISTENT WITH PREVIOUSLY REPORTED AND CALLED VALUE.    Culture (A)  Final    KLEBSIELLA PNEUMONIAE SUSCEPTIBILITIES PERFORMED ON PREVIOUS CULTURE WITHIN THE LAST 5 DAYS. Performed at [facility]    Report Status 05/04/2016 FINAL  Final  Blood Culture ID Panel (Reflexed)     Status: Abnormal   Collection Time: 05/01/16  4:24 PM  Result Value Ref Range Status   Enterococcus species NOT DETECTED NOT DETECTED Final   Listeria monocytogenes NOT DETECTED NOT DETECTED Final   Staphylococcus species NOT DETECTED NOT DETECTED Final   Staphylococcus aureus NOT DETECTED NOT DETECTED Final   Streptococcus species NOT DETECTED NOT DETECTED Final   Streptococcus agalactiae NOT DETECTED NOT DETECTED Final   Streptococcus pneumoniae NOT DETECTED NOT DETECTED Final   Streptococcus pyogenes NOT DETECTED NOT DETECTED Final   Acinetobacter baumannii NOT DETECTED NOT DETECTED Final   Enterobacteriaceae species DETECTED (A) NOT DETECTED Final    Comment: CRITICAL RESULT CALLED TO, READ BACK BY AND VERIFIED WITH: CHRISTINE KATSOUDAS ON 05/02/16 AT 0744 QSD    Enterobacter cloacae complex NOT DETECTED NOT DETECTED Final   Escherichia coli NOT DETECTED NOT DETECTED Final   Klebsiella oxytoca NOT DETECTED NOT DETECTED Final   Klebsiella  pneumoniae DETECTED (A) NOT DETECTED Final    Comment: CRITICAL RESULT CALLED TO, READ BACK BY AND VERIFIED WITH: CHRISTINE KATSOUDAS ON 05/02/16 AT 0744 QSD    Proteus species NOT DETECTED NOT DETECTED Final   Serratia marcescens NOT DETECTED NOT DETECTED Final   Carbapenem resistance NOT DETECTED NOT DETECTED Final   Haemophilus influenzae NOT DETECTED NOT DETECTED Final   Neisseria meningitidis NOT DETECTED NOT DETECTED Final   Pseudomonas aeruginosa NOT DETECTED NOT DETECTED Final   Candida albicans NOT DETECTED NOT DETECTED Final   Candida glabrata NOT DETECTED NOT DETECTED Final   Candida krusei NOT DETECTED NOT DETECTED Final   Candida parapsilosis NOT DETECTED NOT DETECTED Final   Candida tropicalis NOT DETECTED NOT DETECTED Final  Urine culture     Status: None   Collection Time: 05/02/16  1:12 PM  Result Value Ref Range Status   Specimen Description URINE, RANDOM  Final   Special Requests NONE  Final   Culture NO GROWTH Performed at [facility]   Final   Report Status 05/04/2016 FINAL  Final  Culture, expectorated sputum-assessment     Status: None   Collection Time: 05/02/16  1:30 PM  Result Value Ref Range Status   Specimen Description EXPECTORATED SPUTUM  Final   Special Requests Normal  Final   Sputum evaluation THIS SPECIMEN IS ACCEPTABLE FOR SPUTUM CULTURE  Final   Report Status 05/02/2016 FINAL  Final  Culture, respiratory (NON-Expectorated)     Status: None   Collection Time: 05/02/16  1:30 PM  Result Value Ref Range Status   Specimen Description EXPECTORATED SPUTUM  Final   Special Requests Normal Reflexed from [id number]  Final   Gram Stain   Final    MODERATE WBC PRESENT, PREDOMINANTLY PMN MODERATE SQUAMOUS EPITHELIAL CELLS PRESENT ABUNDANT GRAM NEGATIVE RODS FEW GRAM POSITIVE COCCI FEW GRAM POSITIVE RODS Performed at [facility]    Culture MULTIPLE ORGANISMS PRESENT, NONE PREDOMINANT  Final   Report Status  05/04/2016 FINAL  Final  MRSA PCR  Screening     Status: None   Collection Time: 05/03/16  4:48 PM  Result Value Ref Range Status   MRSA by PCR NEGATIVE NEGATIVE Final    Comment:        The GeneXpert MRSA Assay (FDA approved for NASAL specimens only), is one component of a comprehensive MRSA colonization surveillance program. It is not intended to diagnose MRSA infection nor to guide or monitor treatment for MRSA infections.     RADIOLOGY:  No results found.   Management plans discussed with the patient, family and they are in agreement.  CODE STATUS:     Code Status Orders        Start     Ordered   05/01/16 1757  Full code  Continuous     05/01/16 1756    Code Status History    Date Active Date Inactive Code Status Order ID Comments User Context   05/01/2016  5:56 PM 05/04/2016  5:57 AM Full Code [id number]  Enid Baas, MD Inpatient      TOTAL TIME TAKING CARE OF THIS PATIENT: 38 minutes.    Enid Baas M.D on 05/12/2016 at 12:01 PM  Between 7am to 6pm - Pager - [phone]  After 6pm go to [email or web address] - Scientist, research (life sciences) [place]  Office  [phone]  CC: Primary care physician; No primary care provider on file.   Note: This dictation was prepared with Dragon dictation along with smaller phrase technology. Any transcriptional errors that result from this process are unintentional.

## 2016-05-12 NOTE — Clinical Social Work Placement (Signed)
   CLINICAL SOCIAL WORK PLACEMENT  NOTE  Date:  05/12/2016  Patient Details  Name: Dominic [name] MRN: [id number] Date of Birth: [birth year]  Clinical Social Work is seeking post-discharge placement for this patient at the Skilled  Nursing Facility level of care (*CSW will initial, date and re-position this form in  chart as items are completed):  Yes   Patient/family provided with [place] Clinical Social Work Department's list of facilities offering this level of care within the geographic area requested by the patient (or if unable, by the patient's family).  Yes   Patient/family informed of their freedom to choose among providers that offer the needed level of care, that participate in Medicare, Medicaid or managed care program needed by the patient, have an available bed and are willing to accept the patient.  Yes   Patient/family informed of [facility]'s ownership interest in [facility] and [facility], as well as of the fact that they are under no obligation to receive care at these facilities.  PASRR submitted to EDS on 05/06/16     PASRR number received on 05/06/16     Existing PASRR number confirmed on       FL2 transmitted to all facilities in geographic area requested by pt/family on 05/06/16     FL2 transmitted to all facilities within larger geographic area on       Patient informed that his/her managed care company has contracts with or will negotiate with certain facilities, including the following:        Yes   Patient/family informed of bed offers received.  Patient chooses bed at  [facility] )     Physician recommends and patient chooses bed at      Patient to be transferred to  [facility] ) on 05/12/16.  Patient to be transferred to facility by  [facility] )     Patient family notified on 05/12/16 of transfer.  Name of family member notified:   (CSW left patient's daughter [name] a Engineer, technical salesvoicemail. )     PHYSICIAN        Additional Comment:    _______________________________________________ [name], [name], LCSW 05/12/2016, 12:59 PM

## 2016-05-12 NOTE — Progress Notes (Signed)
[facility]  Family notified of transfer, EMS here to transfer pt. Right femoral dialysis cath removed, bandage clean dry intact.

## 2016-05-16 ENCOUNTER — Emergency Department
Admission: EM | Admit: 2016-05-16 | Discharge: 2016-05-16 | Disposition: A | Discharge status: Other Acute Inpatient Hospital | Payer: Medicare Other | Attending: Emergency Medicine | Admitting: Emergency Medicine

## 2016-05-16 ENCOUNTER — Emergency Department: Payer: Medicare Other

## 2016-05-16 ENCOUNTER — Encounter: Payer: Self-pay | Admitting: Emergency Medicine

## 2016-05-16 DIAGNOSIS — R160 Hepatomegaly, not elsewhere classified: Secondary | ICD-10-CM | POA: Diagnosis not present

## 2016-05-16 DIAGNOSIS — F17200 Nicotine dependence, unspecified, uncomplicated: Secondary | ICD-10-CM | POA: Diagnosis not present

## 2016-05-16 DIAGNOSIS — I10 Essential (primary) hypertension: Secondary | ICD-10-CM | POA: Diagnosis not present

## 2016-05-16 DIAGNOSIS — Z79899 Other long term (current) drug therapy: Secondary | ICD-10-CM | POA: Insufficient documentation

## 2016-05-16 DIAGNOSIS — R7401 Elevation of levels of liver transaminase levels: Secondary | ICD-10-CM

## 2016-05-16 DIAGNOSIS — Z794 Long term (current) use of insulin: Secondary | ICD-10-CM | POA: Insufficient documentation

## 2016-05-16 DIAGNOSIS — R109 Unspecified abdominal pain: Secondary | ICD-10-CM | POA: Diagnosis present

## 2016-05-16 DIAGNOSIS — Z7982 Long term (current) use of aspirin: Secondary | ICD-10-CM | POA: Diagnosis not present

## 2016-05-16 DIAGNOSIS — R740 Nonspecific elevation of levels of transaminase and lactic acid dehydrogenase [LDH]: Secondary | ICD-10-CM | POA: Diagnosis not present

## 2016-05-16 DIAGNOSIS — E119 Type 2 diabetes mellitus without complications: Secondary | ICD-10-CM | POA: Insufficient documentation

## 2016-05-16 LAB — CBC WITH DIFFERENTIAL/PLATELET
BASOS PCT: 1 %
Basophils Absolute: 0.1 10*3/uL (ref 0–0.1)
EOS ABS: 0.1 10*3/uL (ref 0–0.7)
EOS PCT: 1 %
HCT: 30 % — ABNORMAL LOW (ref 40.0–52.0)
HEMOGLOBIN: 9.4 g/dL — AB (ref 13.0–18.0)
LYMPHS PCT: 33 %
Lymphs Abs: 4 10*3/uL — ABNORMAL HIGH (ref 1.0–3.6)
MCH: 23.4 pg — AB (ref 26.0–34.0)
MCHC: 31.4 g/dL — ABNORMAL LOW (ref 32.0–36.0)
MCV: 74.5 fL — AB (ref 80.0–100.0)
Monocytes Absolute: 0.2 10*3/uL (ref 0.2–1.0)
Monocytes Relative: 2 %
NEUTROS PCT: 63 %
Neutro Abs: 7.6 10*3/uL — ABNORMAL HIGH (ref 1.4–6.5)
Platelets: 219 10*3/uL (ref 150–440)
RBC: 4.03 MIL/uL — AB (ref 4.40–5.90)
RDW: 16.1 % — ABNORMAL HIGH (ref 11.5–14.5)
WBC: 12 10*3/uL — ABNORMAL HIGH (ref 3.8–10.6)

## 2016-05-16 LAB — LIPASE, BLOOD: Lipase: 29 U/L (ref 11–51)

## 2016-05-16 LAB — ACETAMINOPHEN LEVEL: Acetaminophen (Tylenol), Serum: <10 ug/mL — ABNORMAL LOW (ref 10–30)

## 2016-05-16 LAB — COMPREHENSIVE METABOLIC PANEL
ALK PHOS: 969 U/L — AB (ref 38–126)
ALT: 258 U/L — AB (ref 17–63)
AST: 238 U/L — AB (ref 15–41)
Albumin: 2.4 g/dL — ABNORMAL LOW (ref 3.5–5.0)
Anion gap: 9 (ref 5–15)
BUN: 19 mg/dL (ref 6–20)
CALCIUM: 8.2 mg/dL — AB (ref 8.9–10.3)
CO2: 28 mmol/L (ref 22–32)
CREATININE: 1.14 mg/dL (ref 0.61–1.24)
Chloride: 94 mmol/L — ABNORMAL LOW (ref 101–111)
GFR calc non Af Amer: >60 mL/min (ref >60)
Glucose, Bld: 251 mg/dL — ABNORMAL HIGH (ref 65–99)
Potassium: 4 mmol/L (ref 3.5–5.1)
SODIUM: 131 mmol/L — AB (ref 135–145)
Total Bilirubin: 8.7 mg/dL — ABNORMAL HIGH (ref 0.3–1.2)
Total Protein: 8 g/dL (ref 6.5–8.1)

## 2016-05-16 LAB — PROTIME-INR
INR: 0.94
PROTHROMBIN TIME: 12.6 s (ref 11.4–15.2)

## 2016-05-16 MED ORDER — IOPAMIDOL (ISOVUE-300) INJECTION 61%
100.0000 mL | Freq: Once | INTRAVENOUS | Status: AC | PRN
  Administered 2016-05-16: 100 mL via INTRAVENOUS

## 2016-05-16 MED ORDER — IOPAMIDOL (ISOVUE-300) INJECTION 61%
30.0000 mL | Freq: Once | INTRAVENOUS | Status: AC
  Administered 2016-05-16: 30 mL via ORAL

## 2016-05-16 NOTE — ED Notes (Signed)
Pt tried to urinate in urinal but no urine at this time;
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
## 2016-05-16 NOTE — ED Notes (Signed)
Pt to [facility], pt with shirt, pants, glasses, cell phone and trach supplies in bag, given to paramedic.

## 2016-05-16 NOTE — ED Triage Notes (Signed)
[facility] c/o abnormal liver enzymes.  Per EMS patient had lab tests done approx 1 week ago that were normal and had labs redrawn with abnormal liver function results.  Pt states intermittent lower abd pain but otherwise feels fine and unsure of why facility called 911.  EMS vitals 162/112, 71 HR, 99%.  Sclera yellow.  Pt presents A&Ox4, trach in place, chest rise even and unlabored and in NAD at this time.

## 2016-05-17 DIAGNOSIS — E1129 Type 2 diabetes mellitus with other diabetic kidney complication: Secondary | ICD-10-CM | POA: Insufficient documentation

## 2016-05-17 DIAGNOSIS — K831 Obstruction of bile duct: Secondary | ICD-10-CM | POA: Insufficient documentation

## 2016-05-17 DIAGNOSIS — R160 Hepatomegaly, not elsewhere classified: Secondary | ICD-10-CM | POA: Insufficient documentation

## 2016-05-17 DIAGNOSIS — E1165 Type 2 diabetes mellitus with hyperglycemia: Secondary | ICD-10-CM | POA: Insufficient documentation

## 2016-05-17 DIAGNOSIS — Z9889 Other specified postprocedural states: Secondary | ICD-10-CM | POA: Insufficient documentation

## 2016-05-17 DIAGNOSIS — K8019 Calculus of gallbladder with other cholecystitis with obstruction: Secondary | ICD-10-CM | POA: Insufficient documentation

## 2018-10-21 ENCOUNTER — Encounter: Payer: Self-pay | Admitting: Podiatry

## 2018-10-21 ENCOUNTER — Ambulatory Visit (INDEPENDENT_AMBULATORY_CARE_PROVIDER_SITE_OTHER): Payer: Medicare Other | Admitting: Podiatry

## 2018-10-21 VITALS — BP 148/73 | HR 74

## 2018-10-21 DIAGNOSIS — M79675 Pain in left toe(s): Secondary | ICD-10-CM

## 2018-10-21 DIAGNOSIS — E1142 Type 2 diabetes mellitus with diabetic polyneuropathy: Secondary | ICD-10-CM | POA: Diagnosis not present

## 2018-10-21 DIAGNOSIS — M79674 Pain in right toe(s): Secondary | ICD-10-CM | POA: Diagnosis not present

## 2018-10-21 DIAGNOSIS — B351 Tinea unguium: Secondary | ICD-10-CM

## 2018-10-21 NOTE — Progress Notes (Signed)
This patient presents to the office with chief complaint of long thick nails and diabetic feet.  This patient  says there  is  no pain and discomfort in his  feet.  This patient says there are long thick painful nails.  These nails are painful walking and wearing shoes.  Patient has no history of infection or drainage from both feet.  Patient is unable to  self treat his own nails .  He is already wearing diabetic shoes. This patient presents  to the office today for treatment of the  long nails and a foot evaluation due to history of  diabetes.  General Appearance  Alert, conversant and in no acute stress.  Vascular  Dorsalis pedis  are palpable  bilaterally.  Posterior tibial pulses are absent  B/L. Capillary return is within normal limits  bilaterally. Temperature is within normal limits  bilaterally.  Neurologic  Senn-Weinstein monofilament wire test absent   bilaterally. Muscle power within normal limits bilaterally.  Nails Thick disfigured discolored nails with subungual debris  from hallux to fifth toes bilaterally. No evidence of bacterial infection or drainage bilaterally.  Orthopedic  No limitations of motion of motion feet .  No crepitus or effusions noted.  No bony pathology or digital deformities noted.  DJD 1st MPJ  B/L.  Skin  normotropic skin with no porokeratosis noted bilaterally.  No signs of infections or ulcers noted.     Onychomycosis  Diabetes with no foot complications  IE  Debride nails x 10.  A diabetic foot exam was performed and there is  evidence of  vascular or neurologic pathology.   RTC 3 months.  Patient does qualify for diabetic shoes.   Helane Gunther DPM

## 2019-01-20 ENCOUNTER — Ambulatory Visit: Payer: Medicare Other | Admitting: Podiatry

## 2019-02-21 ENCOUNTER — Ambulatory Visit (INDEPENDENT_AMBULATORY_CARE_PROVIDER_SITE_OTHER): Payer: Medicare Other | Admitting: Podiatry

## 2019-02-21 ENCOUNTER — Other Ambulatory Visit: Payer: Self-pay

## 2019-02-21 ENCOUNTER — Encounter: Payer: Self-pay | Admitting: Podiatry

## 2019-02-21 DIAGNOSIS — B351 Tinea unguium: Secondary | ICD-10-CM | POA: Insufficient documentation

## 2019-02-21 DIAGNOSIS — M79675 Pain in left toe(s): Secondary | ICD-10-CM

## 2019-02-21 DIAGNOSIS — M2140 Flat foot [pes planus] (acquired), unspecified foot: Secondary | ICD-10-CM | POA: Insufficient documentation

## 2019-02-21 DIAGNOSIS — M2142 Flat foot [pes planus] (acquired), left foot: Secondary | ICD-10-CM

## 2019-02-21 DIAGNOSIS — M2141 Flat foot [pes planus] (acquired), right foot: Secondary | ICD-10-CM

## 2019-02-21 DIAGNOSIS — E1140 Type 2 diabetes mellitus with diabetic neuropathy, unspecified: Secondary | ICD-10-CM | POA: Insufficient documentation

## 2019-02-21 DIAGNOSIS — E1142 Type 2 diabetes mellitus with diabetic polyneuropathy: Secondary | ICD-10-CM

## 2019-02-21 DIAGNOSIS — M79674 Pain in right toe(s): Secondary | ICD-10-CM

## 2019-02-21 DIAGNOSIS — M129 Arthropathy, unspecified: Secondary | ICD-10-CM | POA: Insufficient documentation

## 2019-02-21 NOTE — Progress Notes (Signed)
Complaint:  Visit Type: Patient returns to my office for continued preventative foot care services. Complaint: Patient states" my nails have grown long and thick and become painful to walk and wear shoes" Patient has been diagnosed with DM with neuropathy.. The patient presents for preventative foot care services. No changes to ROS  Podiatric Exam: Vascular: dorsalis pedis  are palpable bilateral.  Posterior tibial pulses are absent  B/l. Capillary return is immediate. Temperature gradient is WNL. Skin turgor WNL  Sensorium: Absent  Semmes Weinstein monofilament test. Normal tactile sensation bilaterally. Nail Exam: Pt has thick disfigured discolored nails with subungual debris noted bilateral entire nail hallux through fifth toenails Ulcer Exam: There is no evidence of ulcer or pre-ulcerative changes or infection. Orthopedic Exam: Muscle tone and strength are WNL. No limitations in general ROM. No crepitus or effusions noted. Foot type and digits show no abnormalities. DJD 1st MPJ  B/L.  Pes planus  B/l. Skin: No Porokeratosis. No infection or ulcers  Diagnosis:  Onychomycosis, , Pain in right toe, pain in left toes  Diabetes with neuropathy  Pes planus  DJD 1st MPJ  B/l  Treatment & Plan Procedures and Treatment: Consent by patient was obtained for treatment procedures.   Debridement of mycotic and hypertrophic toenails, 1 through 5 bilateral and clearing of subungual debris. No ulceration, no infection noted. Patient qualifies for diabetic shoes due to DPN and pes planus and DJD 1st MPJ  B/L.  To make an appointment with [facility] for diabetic shoes. Return Visit-Office Procedure: Patient instructed to return to the office for a follow up visit 3 months for continued evaluation and treatment.    Gardiner Barefoot DPM

## 2019-03-02 ENCOUNTER — Other Ambulatory Visit: Payer: Self-pay

## 2019-03-02 ENCOUNTER — Ambulatory Visit: Payer: Medicare Other | Admitting: Orthotics

## 2019-03-02 DIAGNOSIS — M79675 Pain in left toe(s): Secondary | ICD-10-CM

## 2019-03-02 DIAGNOSIS — B351 Tinea unguium: Secondary | ICD-10-CM

## 2019-03-02 DIAGNOSIS — M6788 Other specified disorders of synovium and tendon, other site: Secondary | ICD-10-CM

## 2019-03-02 DIAGNOSIS — E1142 Type 2 diabetes mellitus with diabetic polyneuropathy: Secondary | ICD-10-CM

## 2019-03-02 NOTE — Progress Notes (Signed)
Being seen by PA, will place order when gets appointment with MD

## 2019-05-23 ENCOUNTER — Ambulatory Visit: Payer: Medicare Other | Admitting: Podiatry

## 2019-06-02 ENCOUNTER — Ambulatory Visit: Payer: Medicare Other | Admitting: Podiatry

## 2019-06-06 ENCOUNTER — Encounter: Payer: Self-pay | Admitting: Podiatry

## 2019-06-06 ENCOUNTER — Ambulatory Visit (INDEPENDENT_AMBULATORY_CARE_PROVIDER_SITE_OTHER): Payer: Medicare Other | Admitting: Podiatry

## 2019-06-06 ENCOUNTER — Other Ambulatory Visit: Payer: Self-pay

## 2019-06-06 DIAGNOSIS — B353 Tinea pedis: Secondary | ICD-10-CM | POA: Insufficient documentation

## 2019-06-06 MED ORDER — ECONAZOLE NITRATE 1 % EX CREA: TOPICAL_CREAM | Freq: Every day | CUTANEOUS | 0 refills | Status: DC

## 2019-06-06 MED ORDER — TERBINAFINE HCL 250 MG PO TABS: 250.0000 mg | ORAL_TABLET | Freq: Every day | ORAL | 0 refills | Status: DC

## 2019-06-06 NOTE — Progress Notes (Addendum)
Complaint:  Visit Type: Patient returns to my office for continued preventative foot care services. Complaint: Patient states" my nails have grown long and thick and become painful to walk and wear shoes" Patient has been diagnosed with DM with neuropathy.. The patient presents for preventative foot care services.  After  presenting to the treatment room I noted that he had and inflamed bottom left foot.  He says that the area has been red peeling and raw for the last 3 weeks.  He says that he has applied topical medicine and was soaking his foot in Epson salts.  He says that his foot has continued to worsen and he presents the office today for an evaluation of this skin rash bottom of his left foot. He presents to the office in a wheelchair and presents with a male caregiver.  Patient has received his diabetic shoes.  Podiatric Exam: Vascular: dorsalis pedis  are palpable bilateral.  Posterior tibial pulses are absent  B/l. Capillary return is immediate. Temperature gradient is WNL. Skin turgor WNL  Sensorium: Absent  Semmes Weinstein monofilament test. Normal tactile sensation bilaterally. Nail Exam: Pt has thick disfigured discolored nails with subungual debris noted bilateral entire nail hallux through fifth toenails Ulcer Exam: There is no evidence of ulcer or pre-ulcerative changes or infection. Orthopedic Exam: Muscle tone and strength are WNL. No limitations in general ROM. No crepitus or effusions noted. Foot type and digits show no abnormalities. DJD 1st MPJ  B/L.  Pes planus  B/l. Skin: No Porokeratosis.  Examination of his left foot reveals multiple vesicular lesions that have ruptured and drained.  The there are multiple small vesicular lesions that have not drained.  There is peeling noted on the plantar aspect of his left heel.  Tinea mentagrophytes diagnosed.  Diagnosis:  Tinea mentagrophytes Left foot.  Diabetes with neuropathy  Pes planus  DJD 1st MPJ  B/l  Treatment &  Plan Procedures and Treatment: Consent by patient was obtained for treatment procedures.   Discussed this condition with this patient.  Told him we need to treat the acute skin condition  prior to performing preventative foot care services and raising nail dust that can aggravate his condition.  Therefore patient was prescribed prescribed econazole ointment to apply to his feet.  He was also given a prescription for Lamisil tablet 1 daily for 2 weeks.  He also was given povidine  solution to soak his feet in.  He was told to keep his feet clean and dry and avoid excessive moisture which can worsen this condition.  He was told to return to the office  in 2 weeks for an evaluation of his tinea pedis and then nail care can be performed. Return Visit-Office Procedure: Patient instructed to return to the office for a follow up visit 2 weeks  for continued evaluation and treatment.    Gardiner Barefoot DPM

## 2019-06-13 ENCOUNTER — Telehealth: Payer: Self-pay | Admitting: *Deleted

## 2019-06-13 NOTE — Telephone Encounter (Signed)
I called [facility] states [name] was calling for pt's appt. I informed [name] pt has an appt 06/16/2019 8:15am.

## 2019-06-13 NOTE — Telephone Encounter (Signed)
[facility] said she was calling for [employer].

## 2019-06-16 ENCOUNTER — Ambulatory Visit: Payer: Medicare Other | Admitting: Podiatry

## 2019-06-20 ENCOUNTER — Ambulatory Visit: Payer: Medicare Other | Admitting: Podiatry

## 2019-06-23 ENCOUNTER — Other Ambulatory Visit: Payer: Self-pay

## 2019-06-23 ENCOUNTER — Encounter: Payer: Self-pay | Admitting: Podiatry

## 2019-06-23 ENCOUNTER — Ambulatory Visit: Payer: Medicare Other | Admitting: Podiatry

## 2019-06-23 ENCOUNTER — Ambulatory Visit (INDEPENDENT_AMBULATORY_CARE_PROVIDER_SITE_OTHER): Payer: Medicare Other | Admitting: Podiatry

## 2019-06-23 ENCOUNTER — Telehealth: Payer: Self-pay | Admitting: *Deleted

## 2019-06-23 DIAGNOSIS — B353 Tinea pedis: Secondary | ICD-10-CM

## 2019-06-23 DIAGNOSIS — E1142 Type 2 diabetes mellitus with diabetic polyneuropathy: Secondary | ICD-10-CM

## 2019-06-23 DIAGNOSIS — B351 Tinea unguium: Secondary | ICD-10-CM | POA: Diagnosis not present

## 2019-06-23 DIAGNOSIS — M79675 Pain in left toe(s): Secondary | ICD-10-CM

## 2019-06-23 DIAGNOSIS — M79674 Pain in right toe(s): Secondary | ICD-10-CM | POA: Diagnosis not present

## 2019-06-23 MED ORDER — ECONAZOLE NITRATE 1 % EX CREA: TOPICAL_CREAM | Freq: Every day | CUTANEOUS | 0 refills | Status: DC

## 2019-06-23 NOTE — Progress Notes (Addendum)
Complaint:  Visit Type: Patient returns to my office for continued preventative foot care services. Complaint: Patient states" my nails have grown long and thick and become painful to walk and wear shoes" Patient has been diagnosed with DM with neuropathy.. The patient presents for preventative foot care services. No changes to ROS.  Patient says his left foot has become better taking lamisil and applying econazole to his feet.    Podiatric Exam: Vascular: dorsalis pedis  are palpable bilateral.  Posterior tibial pulses are absent  B/l. Capillary return is immediate. Temperature gradient is WNL. Skin turgor WNL  Sensorium: Absent  Semmes Weinstein monofilament test. Normal tactile sensation bilaterally. Nail Exam: Pt has thick disfigured discolored nails with subungual debris noted bilateral entire nail hallux through fifth toenails Ulcer Exam: There is no evidence of ulcer or pre-ulcerative changes or infection. Orthopedic Exam: Muscle tone and strength are WNL. No limitations in general ROM. No crepitus or effusions noted. Foot type and digits show no abnormalities. DJD 1st MPJ  B/L.  Pes planus  B/l. Skin: No Porokeratosis. No infection or ulcers.  Desquamation noted long the olanntar lateral aspect left foot.  Dry blisters noted over forefoot left foot.  Diagnosis:  Onychomycosis, , Pain in right toe, pain in left toes  Diabetes with neuropathy  Pes planus  DJD 1st MPJ  B/l  Tinea Pedis.  Treatment & Plan Procedures and Treatment: Consent by patient was obtained for treatment procedures.   Debridement of mycotic and hypertrophic toenails, 1 through 5 bilateral and clearing of subungual debris. No ulceration, no infection noted. Prescribe econazole. Return Visit-Office Procedure: Patient instructed to return to the office for a follow up visit 3 months for continued evaluation and treatment. Return if tinea pedis worsens.    Gardiner Barefoot DPM

## 2019-06-24 NOTE — Telephone Encounter (Signed)
error 

## 2019-07-11 ENCOUNTER — Other Ambulatory Visit: Payer: Self-pay | Admitting: Podiatry

## 2019-12-26 ENCOUNTER — Emergency Department: Payer: Medicare Other

## 2019-12-26 ENCOUNTER — Inpatient Hospital Stay
Admission: EM | Admit: 2019-12-26 | Discharge: 2019-12-29 | DRG: 638 | Disposition: A | Discharge status: Home / Self Care | Payer: Medicare Other | Attending: Internal Medicine | Admitting: Internal Medicine

## 2019-12-26 ENCOUNTER — Other Ambulatory Visit: Payer: Self-pay

## 2019-12-26 DIAGNOSIS — H409 Unspecified glaucoma: Secondary | ICD-10-CM | POA: Diagnosis present

## 2019-12-26 DIAGNOSIS — I480 Paroxysmal atrial fibrillation: Secondary | ICD-10-CM | POA: Diagnosis present

## 2019-12-26 DIAGNOSIS — R680 Hypothermia, not associated with low environmental temperature: Secondary | ICD-10-CM | POA: Diagnosis present

## 2019-12-26 DIAGNOSIS — I129 Hypertensive chronic kidney disease with stage 1 through stage 4 chronic kidney disease, or unspecified chronic kidney disease: Secondary | ICD-10-CM | POA: Diagnosis present

## 2019-12-26 DIAGNOSIS — Z888 Allergy status to other drugs, medicaments and biological substances status: Secondary | ICD-10-CM

## 2019-12-26 DIAGNOSIS — R945 Abnormal results of liver function studies: Secondary | ICD-10-CM | POA: Diagnosis not present

## 2019-12-26 DIAGNOSIS — E876 Hypokalemia: Secondary | ICD-10-CM | POA: Diagnosis present

## 2019-12-26 DIAGNOSIS — R778 Other specified abnormalities of plasma proteins: Secondary | ICD-10-CM | POA: Diagnosis present

## 2019-12-26 DIAGNOSIS — Z79899 Other long term (current) drug therapy: Secondary | ICD-10-CM | POA: Diagnosis not present

## 2019-12-26 DIAGNOSIS — E162 Hypoglycemia, unspecified: Secondary | ICD-10-CM | POA: Diagnosis not present

## 2019-12-26 DIAGNOSIS — Z7982 Long term (current) use of aspirin: Secondary | ICD-10-CM

## 2019-12-26 DIAGNOSIS — E1140 Type 2 diabetes mellitus with diabetic neuropathy, unspecified: Secondary | ICD-10-CM | POA: Diagnosis present

## 2019-12-26 DIAGNOSIS — J42 Unspecified chronic bronchitis: Secondary | ICD-10-CM | POA: Diagnosis present

## 2019-12-26 DIAGNOSIS — E1165 Type 2 diabetes mellitus with hyperglycemia: Secondary | ICD-10-CM | POA: Diagnosis not present

## 2019-12-26 DIAGNOSIS — N179 Acute kidney failure, unspecified: Secondary | ICD-10-CM | POA: Diagnosis not present

## 2019-12-26 DIAGNOSIS — Z7289 Other problems related to lifestyle: Secondary | ICD-10-CM

## 2019-12-26 DIAGNOSIS — D72829 Elevated white blood cell count, unspecified: Secondary | ICD-10-CM | POA: Diagnosis not present

## 2019-12-26 DIAGNOSIS — R7989 Other specified abnormal findings of blood chemistry: Secondary | ICD-10-CM | POA: Diagnosis not present

## 2019-12-26 DIAGNOSIS — N189 Chronic kidney disease, unspecified: Secondary | ICD-10-CM

## 2019-12-26 DIAGNOSIS — R531 Weakness: Secondary | ICD-10-CM | POA: Diagnosis not present

## 2019-12-26 DIAGNOSIS — E1122 Type 2 diabetes mellitus with diabetic chronic kidney disease: Secondary | ICD-10-CM | POA: Diagnosis present

## 2019-12-26 DIAGNOSIS — Z880 Allergy status to penicillin: Secondary | ICD-10-CM

## 2019-12-26 DIAGNOSIS — I482 Chronic atrial fibrillation, unspecified: Secondary | ICD-10-CM | POA: Diagnosis present

## 2019-12-26 DIAGNOSIS — J9610 Chronic respiratory failure, unspecified whether with hypoxia or hypercapnia: Secondary | ICD-10-CM | POA: Diagnosis present

## 2019-12-26 DIAGNOSIS — N182 Chronic kidney disease, stage 2 (mild): Secondary | ICD-10-CM | POA: Diagnosis not present

## 2019-12-26 DIAGNOSIS — Z9889 Other specified postprocedural states: Secondary | ICD-10-CM

## 2019-12-26 DIAGNOSIS — H5462 Unqualified visual loss, left eye, normal vision right eye: Secondary | ICD-10-CM | POA: Diagnosis present

## 2019-12-26 DIAGNOSIS — E1129 Type 2 diabetes mellitus with other diabetic kidney complication: Secondary | ICD-10-CM | POA: Diagnosis present

## 2019-12-26 DIAGNOSIS — Z794 Long term (current) use of insulin: Secondary | ICD-10-CM

## 2019-12-26 DIAGNOSIS — J9611 Chronic respiratory failure with hypoxia: Secondary | ICD-10-CM | POA: Diagnosis not present

## 2019-12-26 DIAGNOSIS — K219 Gastro-esophageal reflux disease without esophagitis: Secondary | ICD-10-CM | POA: Diagnosis present

## 2019-12-26 DIAGNOSIS — I10 Essential (primary) hypertension: Secondary | ICD-10-CM | POA: Diagnosis present

## 2019-12-26 DIAGNOSIS — E11649 Type 2 diabetes mellitus with hypoglycemia without coma: Secondary | ICD-10-CM | POA: Diagnosis present

## 2019-12-26 DIAGNOSIS — N1831 Chronic kidney disease, stage 3a: Secondary | ICD-10-CM | POA: Diagnosis present

## 2019-12-26 DIAGNOSIS — T68XXXA Hypothermia, initial encounter: Secondary | ICD-10-CM | POA: Diagnosis present

## 2019-12-26 DIAGNOSIS — Z91018 Allergy to other foods: Secondary | ICD-10-CM

## 2019-12-26 DIAGNOSIS — Z9049 Acquired absence of other specified parts of digestive tract: Secondary | ICD-10-CM

## 2019-12-26 DIAGNOSIS — F17210 Nicotine dependence, cigarettes, uncomplicated: Secondary | ICD-10-CM | POA: Diagnosis present

## 2019-12-26 DIAGNOSIS — Z7951 Long term (current) use of inhaled steroids: Secondary | ICD-10-CM

## 2019-12-26 DIAGNOSIS — Z9689 Presence of other specified functional implants: Secondary | ICD-10-CM | POA: Diagnosis present

## 2019-12-26 DIAGNOSIS — Z720 Tobacco use: Secondary | ICD-10-CM | POA: Insufficient documentation

## 2019-12-26 DIAGNOSIS — Z79891 Long term (current) use of opiate analgesic: Secondary | ICD-10-CM

## 2019-12-26 DIAGNOSIS — Z20822 Contact with and (suspected) exposure to covid-19: Secondary | ICD-10-CM | POA: Diagnosis present

## 2019-12-26 DIAGNOSIS — D509 Iron deficiency anemia, unspecified: Secondary | ICD-10-CM | POA: Diagnosis present

## 2019-12-26 LAB — CBC WITH DIFFERENTIAL/PLATELET
Abs Immature Granulocytes: 0.03 10*3/uL (ref 0.00–0.07)
Basophils Absolute: 0 10*3/uL (ref 0.0–0.1)
Basophils Relative: 0 %
Eosinophils Absolute: 0 10*3/uL (ref 0.0–0.5)
Eosinophils Relative: 0 %
HCT: 28.2 % — ABNORMAL LOW (ref 39.0–52.0)
Hemoglobin: 9.3 g/dL — ABNORMAL LOW (ref 13.0–17.0)
Immature Granulocytes: 0 %
Lymphocytes Relative: 35 %
Lymphs Abs: 3.9 10*3/uL (ref 0.7–4.0)
MCH: 25.5 pg — ABNORMAL LOW (ref 26.0–34.0)
MCHC: 33 g/dL (ref 30.0–36.0)
MCV: 77.3 fL — ABNORMAL LOW (ref 80.0–100.0)
Monocytes Absolute: 0.4 10*3/uL (ref 0.1–1.0)
Monocytes Relative: 4 %
Neutro Abs: 7 10*3/uL (ref 1.7–7.7)
Neutrophils Relative %: 61 %
Platelets: 278 10*3/uL (ref 150–400)
RBC: 3.65 MIL/uL — ABNORMAL LOW (ref 4.22–5.81)
RDW: 18.7 % — ABNORMAL HIGH (ref 11.5–15.5)
WBC: 11.4 10*3/uL — ABNORMAL HIGH (ref 4.0–10.5)
nRBC: 0 % (ref 0.0–0.2)

## 2019-12-26 LAB — GLUCOSE, CAPILLARY
Glucose-Capillary: 18 mg/dL — CL (ref 70–99)
Glucose-Capillary: 135 mg/dL — ABNORMAL HIGH (ref 70–99)
Glucose-Capillary: 183 mg/dL — ABNORMAL HIGH (ref 70–99)
Glucose-Capillary: 241 mg/dL — ABNORMAL HIGH (ref 70–99)
Glucose-Capillary: 242 mg/dL — ABNORMAL HIGH (ref 70–99)
Glucose-Capillary: 235 mg/dL — ABNORMAL HIGH (ref 70–99)
Glucose-Capillary: 236 mg/dL — ABNORMAL HIGH (ref 70–99)
Glucose-Capillary: 220 mg/dL — ABNORMAL HIGH (ref 70–99)
Glucose-Capillary: 213 mg/dL — ABNORMAL HIGH (ref 70–99)
Glucose-Capillary: 184 mg/dL — ABNORMAL HIGH (ref 70–99)
Glucose-Capillary: 184 mg/dL — ABNORMAL HIGH (ref 70–99)

## 2019-12-26 LAB — COMPREHENSIVE METABOLIC PANEL
ALT: 101 U/L — ABNORMAL HIGH (ref 0–44)
AST: 301 U/L — ABNORMAL HIGH (ref 15–41)
Albumin: 2.3 g/dL — ABNORMAL LOW (ref 3.5–5.0)
Alkaline Phosphatase: 507 U/L — ABNORMAL HIGH (ref 38–126)
Anion gap: 13 (ref 5–15)
BUN: 9 mg/dL (ref 8–23)
CO2: 27 mmol/L (ref 22–32)
Calcium: 8.2 mg/dL — ABNORMAL LOW (ref 8.9–10.3)
Chloride: 96 mmol/L — ABNORMAL LOW (ref 98–111)
Creatinine, Ser: 1.5 mg/dL — ABNORMAL HIGH (ref 0.61–1.24)
GFR calc Af Amer: 52 mL/min — ABNORMAL LOW (ref >60)
GFR calc non Af Amer: 45 mL/min — ABNORMAL LOW (ref >60)
Glucose, Bld: 31 mg/dL — CL (ref 70–99)
Potassium: 3.2 mmol/L — ABNORMAL LOW (ref 3.5–5.1)
Sodium: 136 mmol/L (ref 135–145)
Total Bilirubin: 3.9 mg/dL — ABNORMAL HIGH (ref 0.3–1.2)
Total Protein: 7.3 g/dL (ref 6.5–8.1)

## 2019-12-26 LAB — TROPONIN I (HIGH SENSITIVITY)
Troponin I (High Sensitivity): 34 ng/L — ABNORMAL HIGH (ref <18)
Troponin I (High Sensitivity): 31 ng/L — ABNORMAL HIGH (ref <18)
Troponin I (High Sensitivity): 33 ng/L — ABNORMAL HIGH (ref <18)
Troponin I (High Sensitivity): 37 ng/L — ABNORMAL HIGH (ref <18)
Troponin I (High Sensitivity): 37 ng/L — ABNORMAL HIGH (ref <18)

## 2019-12-26 LAB — RESPIRATORY PANEL BY RT PCR (FLU A&B, COVID)
Influenza A by PCR: NEGATIVE
Influenza B by PCR: NEGATIVE
SARS Coronavirus 2 by RT PCR: NEGATIVE

## 2019-12-26 LAB — BRAIN NATRIURETIC PEPTIDE: B Natriuretic Peptide: 173 pg/mL — ABNORMAL HIGH (ref 0.0–100.0)

## 2019-12-26 LAB — PROTIME-INR
INR: 1 (ref 0.8–1.2)
Prothrombin Time: 13.5 seconds (ref 11.4–15.2)

## 2019-12-26 LAB — PROCALCITONIN: Procalcitonin: 0.81 ng/mL

## 2019-12-26 LAB — APTT: aPTT: 30 seconds (ref 24–36)

## 2019-12-26 LAB — LACTIC ACID, PLASMA: Lactic Acid, Venous: 1.3 mmol/L (ref 0.5–1.9)

## 2019-12-26 MED ORDER — POTASSIUM CHLORIDE CRYS ER 20 MEQ PO TBCR: 40.0000 meq | EXTENDED_RELEASE_TABLET | Freq: Once | ORAL | Status: DC

## 2019-12-26 MED ORDER — VANCOMYCIN HCL IN DEXTROSE 1-5 GM/200ML-% IV SOLN
1000.0000 mg | Freq: Once | INTRAVENOUS | Status: AC
  Administered 2019-12-26: 1000 mg via INTRAVENOUS
  Filled 2019-12-26: qty 200

## 2019-12-26 MED ORDER — DM-GUAIFENESIN ER 30-600 MG PO TB12: 1.0000 | ORAL_TABLET | Freq: Two times a day (BID) | ORAL | Status: DC | PRN

## 2019-12-26 MED ORDER — IPRATROPIUM BROMIDE 0.02 % IN SOLN
0.5000 mg | Freq: Four times a day (QID) | RESPIRATORY_TRACT | Status: DC
  Administered 2019-12-26 – 2019-12-29 (×9): 0.5 mg via RESPIRATORY_TRACT
  Filled 2019-12-26 (×9): qty 2.5

## 2019-12-26 MED ORDER — ASPIRIN 81 MG PO CHEW: 81.0000 mg | CHEWABLE_TABLET | Freq: Every day | ORAL | Status: DC

## 2019-12-26 MED ORDER — ONDANSETRON HCL 4 MG/2ML IJ SOLN: 4.0000 mg | Freq: Three times a day (TID) | INTRAMUSCULAR | Status: DC | PRN

## 2019-12-26 MED ORDER — ALBUTEROL SULFATE (2.5 MG/3ML) 0.083% IN NEBU: 2.5000 mg | INHALATION_SOLUTION | RESPIRATORY_TRACT | Status: DC | PRN

## 2019-12-26 MED ORDER — DEXTROSE 50 % IV SOLN
INTRAVENOUS | Status: AC
  Filled 2019-12-26: qty 50

## 2019-12-26 MED ORDER — PANTOPRAZOLE SODIUM 40 MG PO TBEC
40.0000 mg | DELAYED_RELEASE_TABLET | Freq: Every day | ORAL | Status: DC
  Administered 2019-12-27 – 2019-12-29 (×3): 40 mg via ORAL
  Filled 2019-12-26 (×3): qty 1

## 2019-12-26 MED ORDER — DEXTROSE 50 % IV SOLN: 25.0000 mL | INTRAVENOUS | Status: DC | PRN

## 2019-12-26 MED ORDER — TIMOLOL MALEATE 0.5 % OP SOLN
1.0000 [drp] | Freq: Two times a day (BID) | OPHTHALMIC | Status: DC
  Administered 2019-12-26 – 2019-12-29 (×6): 1 [drp] via OPHTHALMIC
  Filled 2019-12-26 (×2): qty 5

## 2019-12-26 MED ORDER — SODIUM CHLORIDE 0.9 % IV BOLUS
500.0000 mL | Freq: Once | INTRAVENOUS | Status: AC
  Administered 2019-12-26: 500 mL via INTRAVENOUS

## 2019-12-26 MED ORDER — METRONIDAZOLE IN NACL 5-0.79 MG/ML-% IV SOLN
500.0000 mg | Freq: Once | INTRAVENOUS | Status: AC
  Administered 2019-12-26: 500 mg via INTRAVENOUS
  Filled 2019-12-26: qty 100

## 2019-12-26 MED ORDER — BRIMONIDINE TARTRATE-TIMOLOL 0.2-0.5 % OP SOLN: 1.0000 [drp] | Freq: Two times a day (BID) | OPHTHALMIC | Status: DC

## 2019-12-26 MED ORDER — DEXTROSE 50 % IV SOLN
25.0000 g | Freq: Once | INTRAVENOUS | Status: AC
  Administered 2019-12-26: 25 g via INTRAVENOUS

## 2019-12-26 MED ORDER — DEXTROSE 10 % IV SOLN: INTRAVENOUS | Status: DC

## 2019-12-26 MED ORDER — HYDRALAZINE HCL 20 MG/ML IJ SOLN: 5.0000 mg | INTRAMUSCULAR | Status: DC | PRN

## 2019-12-26 MED ORDER — SODIUM CHLORIDE 0.9 % IV SOLN
2.0000 g | Freq: Once | INTRAVENOUS | Status: AC
  Administered 2019-12-26: 2 g via INTRAVENOUS
  Filled 2019-12-26: qty 2

## 2019-12-26 MED ORDER — BRIMONIDINE TARTRATE 0.2 % OP SOLN
1.0000 [drp] | Freq: Two times a day (BID) | OPHTHALMIC | Status: DC
  Administered 2019-12-26 – 2019-12-29 (×6): 1 [drp] via OPHTHALMIC
  Filled 2019-12-26 (×2): qty 5

## 2019-12-26 MED ORDER — ENOXAPARIN SODIUM 40 MG/0.4ML ~~LOC~~ SOLN
40.0000 mg | SUBCUTANEOUS | Status: DC
  Filled 2019-12-26: qty 0.4

## 2019-12-26 MED ORDER — LORATADINE 10 MG PO TABS
10.0000 mg | ORAL_TABLET | Freq: Every day | ORAL | Status: DC
  Administered 2019-12-27 – 2019-12-29 (×3): 10 mg via ORAL
  Filled 2019-12-26 (×3): qty 1

## 2019-12-26 NOTE — ED Notes (Signed)
Dr. Clyde Lundborg notified of pt refusing swallow screen and bair hugger being removed

## 2019-12-26 NOTE — ED Notes (Signed)
Pt states he has not been eating at home d/ t "difficulty swallowing". Per Dr. Clyde Lundborg at bedside, pt to receive swallow screen.  Pt refusing swallow screen at this time

## 2019-12-26 NOTE — ED Notes (Signed)
Pt changed and peri care provided. linnens changed

## 2019-12-26 NOTE — ED Notes (Signed)
This RN attempted to call report to floor, was told RN would call back in 15 minutes

## 2019-12-26 NOTE — ED Notes (Signed)
Pt unable to provide urine sample at this time 

## 2019-12-26 NOTE — ED Notes (Signed)
CBG 241. Dextrose 10% infusion stopped, Dr Elenora Gamma aware

## 2019-12-26 NOTE — ED Notes (Signed)
Trach suctioned by RT.

## 2019-12-26 NOTE — Progress Notes (Signed)
CODE SEPSIS - PHARMACY COMMUNICATION  **Broad Spectrum Antibiotics should be administered within 1 hour of Sepsis diagnosis**  Time Code Sepsis Called/Page Received: @ 1141  Antibiotics Ordered: Vancomycin, Metronidazole, aztreonam   Time of 1st antibiotic administration: Aztreonam @ 1212   Katha Cabal ,PharmD Clinical Pharmacist  12/26/2019  11:43 AM

## 2019-12-26 NOTE — ED Notes (Signed)
Dr Lenard Lance notified of CBG, verbal order to change dextrose 10% to 38mL/Hr

## 2019-12-26 NOTE — ED Triage Notes (Signed)
Pt to ED via [facility] from home for chief complaint of hypoglycemia and weakness. Reports not eating for at least one week. EMS reports CBG 28, 15 g oral glucose given with no improvement. Pt alert and oriented per EMS.  Pt has trach on arrival.  Pt given orange juice while IV attempted d/t difficult stick on arrival

## 2019-12-26 NOTE — ED Notes (Signed)
Pt placed on bair hugger

## 2019-12-26 NOTE — Consult Note (Signed)
PHARMACY -  BRIEF ANTIBIOTIC NOTE   Pharmacy has received consult(s) for Vancomycin and Aztreonam from an ED provider.  The patient's profile has been reviewed for ht/wt/allergies/indication/available labs.    One time order(s) placed for Vancomycin 1g x1 dose and aztreonam 2g x1 dose.   Further antibiotics/pharmacy consults should be ordered by admitting physician if indicated.                       Thank you, Katha Cabal 12/26/2019  11:43 AM
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
## 2019-12-26 NOTE — ED Notes (Signed)
Pt rectal temp 97.9. bair hugger removed from pt, warm blanket applied

## 2019-12-27 DIAGNOSIS — Z794 Long term (current) use of insulin: Secondary | ICD-10-CM

## 2019-12-27 DIAGNOSIS — R945 Abnormal results of liver function studies: Secondary | ICD-10-CM | POA: Diagnosis not present

## 2019-12-27 DIAGNOSIS — N179 Acute kidney failure, unspecified: Secondary | ICD-10-CM | POA: Diagnosis not present

## 2019-12-27 DIAGNOSIS — J9611 Chronic respiratory failure with hypoxia: Secondary | ICD-10-CM

## 2019-12-27 DIAGNOSIS — E1122 Type 2 diabetes mellitus with diabetic chronic kidney disease: Secondary | ICD-10-CM

## 2019-12-27 DIAGNOSIS — E162 Hypoglycemia, unspecified: Secondary | ICD-10-CM | POA: Diagnosis not present

## 2019-12-27 DIAGNOSIS — N182 Chronic kidney disease, stage 2 (mild): Secondary | ICD-10-CM

## 2019-12-27 DIAGNOSIS — T68XXXA Hypothermia, initial encounter: Secondary | ICD-10-CM | POA: Diagnosis not present

## 2019-12-27 LAB — BLOOD CULTURE ID PANEL (REFLEXED)

## 2019-12-27 LAB — CBC
HCT: 21.4 % — ABNORMAL LOW (ref 39.0–52.0)
Hemoglobin: 7.2 g/dL — ABNORMAL LOW (ref 13.0–17.0)
MCH: 26.1 pg (ref 26.0–34.0)
MCHC: 33.6 g/dL (ref 30.0–36.0)
MCV: 77.5 fL — ABNORMAL LOW (ref 80.0–100.0)
Platelets: 220 10*3/uL (ref 150–400)
RBC: 2.76 MIL/uL — ABNORMAL LOW (ref 4.22–5.81)
RDW: 18.6 % — ABNORMAL HIGH (ref 11.5–15.5)
WBC: 8.4 10*3/uL (ref 4.0–10.5)
nRBC: 0.4 % — ABNORMAL HIGH (ref 0.0–0.2)

## 2019-12-27 LAB — HEPATIC FUNCTION PANEL
ALT: 80 U/L — ABNORMAL HIGH (ref 0–44)
AST: 214 U/L — ABNORMAL HIGH (ref 15–41)
Albumin: 1.8 g/dL — ABNORMAL LOW (ref 3.5–5.0)
Alkaline Phosphatase: 413 U/L — ABNORMAL HIGH (ref 38–126)
Bilirubin, Direct: 1.7 mg/dL — ABNORMAL HIGH (ref 0.0–0.2)
Indirect Bilirubin: 1.2 mg/dL — ABNORMAL HIGH (ref 0.3–0.9)
Total Bilirubin: 2.9 mg/dL — ABNORMAL HIGH (ref 0.3–1.2)
Total Protein: 5.5 g/dL — ABNORMAL LOW (ref 6.5–8.1)

## 2019-12-27 LAB — BASIC METABOLIC PANEL
Anion gap: 10 (ref 5–15)
BUN: 13 mg/dL (ref 8–23)
CO2: 25 mmol/L (ref 22–32)
Calcium: 7.4 mg/dL — ABNORMAL LOW (ref 8.9–10.3)
Chloride: 99 mmol/L (ref 98–111)
Creatinine, Ser: 2.03 mg/dL — ABNORMAL HIGH (ref 0.61–1.24)
GFR calc Af Amer: 36 mL/min — ABNORMAL LOW (ref >60)
GFR calc non Af Amer: 31 mL/min — ABNORMAL LOW (ref >60)
Glucose, Bld: 201 mg/dL — ABNORMAL HIGH (ref 70–99)
Potassium: 3.1 mmol/L — ABNORMAL LOW (ref 3.5–5.1)
Sodium: 134 mmol/L — ABNORMAL LOW (ref 135–145)

## 2019-12-27 LAB — PROTIME-INR
INR: 1.2 (ref 0.8–1.2)
Prothrombin Time: 14.3 seconds (ref 11.4–15.2)

## 2019-12-27 LAB — GLUCOSE, CAPILLARY
Glucose-Capillary: 173 mg/dL — ABNORMAL HIGH (ref 70–99)
Glucose-Capillary: 185 mg/dL — ABNORMAL HIGH (ref 70–99)
Glucose-Capillary: 196 mg/dL — ABNORMAL HIGH (ref 70–99)
Glucose-Capillary: 200 mg/dL — ABNORMAL HIGH (ref 70–99)
Glucose-Capillary: 204 mg/dL — ABNORMAL HIGH (ref 70–99)
Glucose-Capillary: 207 mg/dL — ABNORMAL HIGH (ref 70–99)
Glucose-Capillary: 211 mg/dL — ABNORMAL HIGH (ref 70–99)
Glucose-Capillary: 216 mg/dL — ABNORMAL HIGH (ref 70–99)
Glucose-Capillary: 219 mg/dL — ABNORMAL HIGH (ref 70–99)
Glucose-Capillary: 226 mg/dL — ABNORMAL HIGH (ref 70–99)

## 2019-12-27 LAB — MAGNESIUM: Magnesium: 1.1 mg/dL — ABNORMAL LOW (ref 1.7–2.4)

## 2019-12-27 LAB — LIPID PANEL
Cholesterol: 304 mg/dL — ABNORMAL HIGH (ref 0–200)
HDL: <10 mg/dL — ABNORMAL LOW (ref >40)
Triglycerides: 119 mg/dL (ref <150)
VLDL: 24 mg/dL (ref 0–40)

## 2019-12-27 LAB — HEMOGLOBIN A1C
Hgb A1c MFr Bld: 4 % — ABNORMAL LOW (ref 4.8–5.6)
Mean Plasma Glucose: 68.1 mg/dL

## 2019-12-27 MED ORDER — VANCOMYCIN HCL 1500 MG/300ML IV SOLN
1500.0000 mg | INTRAVENOUS | Status: DC
  Administered 2019-12-27: 1500 mg via INTRAVENOUS
  Filled 2019-12-27: qty 300

## 2019-12-27 MED ORDER — METRONIDAZOLE IN NACL 5-0.79 MG/ML-% IV SOLN
500.0000 mg | Freq: Three times a day (TID) | INTRAVENOUS | Status: DC
  Administered 2019-12-27 – 2019-12-28 (×4): 500 mg via INTRAVENOUS
  Filled 2019-12-27 (×6): qty 100

## 2019-12-27 MED ORDER — DEXTROSE 10 % IV SOLN: INTRAVENOUS | Status: DC

## 2019-12-27 MED ORDER — VANCOMYCIN HCL IN DEXTROSE 1-5 GM/200ML-% IV SOLN
1000.0000 mg | Freq: Once | INTRAVENOUS | Status: DC
  Filled 2019-12-27: qty 200

## 2019-12-27 MED ORDER — CHLORHEXIDINE GLUCONATE CLOTH 2 % EX PADS
6.0000 | MEDICATED_PAD | Freq: Every day | CUTANEOUS | Status: DC
  Administered 2019-12-27 – 2019-12-28 (×2): 6 via TOPICAL

## 2019-12-27 MED ORDER — SODIUM CHLORIDE 0.9 % IV SOLN
2.0000 g | Freq: Two times a day (BID) | INTRAVENOUS | Status: DC
  Administered 2019-12-27 – 2019-12-28 (×3): 2 g via INTRAVENOUS
  Filled 2019-12-27 (×4): qty 2

## 2019-12-27 MED ORDER — SODIUM CHLORIDE 0.9 % IV SOLN
INTRAVENOUS | Status: DC | PRN
  Administered 2019-12-27: 250 mL via INTRAVENOUS

## 2019-12-27 MED ORDER — SODIUM CHLORIDE 0.9 % IV SOLN: INTRAVENOUS | Status: AC

## 2019-12-27 MED ORDER — VANCOMYCIN HCL 1250 MG/250ML IV SOLN: 1250.0000 mg | INTRAVENOUS | Status: DC

## 2019-12-27 MED ORDER — POTASSIUM CHLORIDE CRYS ER 20 MEQ PO TBCR
40.0000 meq | EXTENDED_RELEASE_TABLET | Freq: Once | ORAL | Status: AC
  Administered 2019-12-27: 40 meq via ORAL
  Filled 2019-12-27: qty 2

## 2019-12-27 NOTE — Progress Notes (Signed)
PT Cancellation Note  Patient Details Name: Dominic [name] MRN: [id number] DOB: 1946-05-19   Cancelled Treatment:    Reason Eval/Treat Not Completed: Patient declined PT services.  Pt stated that he "just wants to go home" and that he does not desire PT services while in the hospital or at home.  Pt reported that he is non-ambulatory, uses an electric w/c at home, and that he is at his functional baseline at this time, nursing notified.  Will complete PT orders at this time but will reassess pt pending a change in status upon receipt of new PT orders.     Ovidio Hanger PT, DPT 12/27/19, 11:44 AM

## 2019-12-27 NOTE — Progress Notes (Addendum)
[facility] PROGRESS NOTE    Progress Note  Dominic [name]  TDV:[id number] DOB: 1945/11/28 DOA: 12/26/2019 PCP: Patient, No Pcp Per     Brief Narrative:   Dominic [name] is an 74 y.o. 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  Assessment/Plan:   Hypoglycemia In the setting of insulin and sulfonylureas and new acute kidney injury, there is likely the etiology. Hemoglobin A1c was 4.0.  He probably does not need insulin or sulfonylureas as an outpatient. Oral hypoglycemic agents were held on admission, he was started on D10 CBG has been consistently greater than 200. We will change CBGs to before meals and at bedtime KVO D10 infusion.  1 out of 2 blood cultures with a staph species: Could be a contaminant, started empirically on Vanco and cefepime will await speciation. MRSA PCR is negative we will continue cefepime. His hypothermia has resolved which could be due to hypoglycemia and his leukocytosis is improving.    Acute kidney injury chronic kidney disease stage IIIa: Baseline creatinine is around 1.1. In the setting of lisinopril and hydrochlorothiazide use. We will start him on IV fluids as it appears prerenal, recheck a basic metabolic panel in the morning.  Abnormal LFTs: Acute hepatitis panel is pending, LFTs are split more than 2-1 question alcohol versus medication, with an elevated T bili and alkaline phosphatase over 500. Check a PT and INR Previous LFTs were normal, he has had a history of cholecystectomy and ERCP back in 2017 with brushing there was negative for  malignant cells, during this time in 2017 his brushings showed atypical cells but no malignant cells. check a CT scan of the abdomen pelvis with contrast once his cr. Is improved.  Type II diabetes mellitus with renal manifestations (HCC) Oral hypoglycemic agents and insulins were held on admission unknown hemoglobin A1c. Continue to check CBGs before meals and at bedtime. Continue to hold insulins for an additional 24 hours, as he was on sulfonylureas in the setting of acute kidney injury, which will make the half-life of glipizide longer.  Respiratory failure with a history of tracheostomy: Continue inhalers and bronchodilators appreciate RT's assistance.  Chronic atrial fibrillation: Not on anticoagulation, now in sinus rhythm. I cannot find in the chart a reason why he is not on anticoagulation.  Microcytic anemia: Hemoglobin is 7.2 patient is currently asymptomatic continue to monitor closely, check FOBT there is no signs of oral bleeding.  Essential hypertension: Due to worsening renal function antihypertensive medications were held his blood pressure seems to be stable.  Hypothermia: Now resolved treated with a bear hugger. Influenza and SARS-CoV-2 PCR both negative  Hypokalemia: Repleted orally, recheck a basic metabolic panel in the morning.  Elevated troponin I: Likely due to demand ischemia.  Leukocytosis: Question due to hypoglycemia but patient also has elevated LFTs which are significant, he was started empirically on IV vancomycin Flagyl and aztreonam. Procalcitonin 0.81, will recheck a CT scan of the abdomen and pelvis with contrast once his creatinine has returned to baseline. Blood culture shows 1 out of 2 gram-positive cocci cocci, which is probably a contaminant, will continue empiric antibiotic until speciation is done.   DVT prophylaxis: lovenox Family  Communication:none Status is: Inpatient  Remains inpatient appropriate because:Persistent severe  electrolyte disturbances   Dispo: The patient is from: Home              Anticipated d/c is to: SNF              Anticipated d/c date is: > 3 days              Patient currently is not medically stable to d/c.         Code Status:     Code Status Orders  (From admission, onward)         Start     Ordered   12/26/19 1603  Full code  Continuous     12/26/19 1602        Code Status History    Date Active Date Inactive Code Status Order ID Comments User Context   05/01/2016 1756 05/04/2016 0557 Full Code [id number]  Dominic BaasKalisetti, Radhika, MD Inpatient   Advance Care Planning Activity        IV Access:    Peripheral IV   Procedures and diagnostic studies:   DG Chest Portable 1 View  Result Date: 12/26/2019 CLINICAL DATA:  Hypoglycemia and weakness. EXAM: PORTABLE CHEST 1 VIEW COMPARISON:  05/03/2016. FINDINGS: 1206 hours. Tracheostomy tube again noted. The lungs are clear without focal pneumonia, edema, pneumothorax or pleural effusion. Cardiopericardial silhouette is at upper limits of normal for size. Fat pad noted at the cardiac apex as demonstrated on CT of 05/16/2016. The visualized bony structures of the thorax are intact. Telemetry leads overlie the chest. IMPRESSION: No active disease. Electronically Signed   By: Kennith CenterEric  Mansell M.D.   On: 12/26/2019 12:20     Medical Consultants:    None.  Anti-Infectives:   IV vancomycin Flagyl and aztreonam  Subjective:    Noal H Hartel nonverbal  Objective:    Vitals:   12/26/19 2139 12/26/19 2330 12/27/19 0215 12/27/19 0443  BP:    (!) 117/54  Pulse:    71  Resp:      Temp:    98.4 F (36.9 C)  TempSrc:    Oral  SpO2: 98% 98% 99% 100%  Weight:    86 kg  Height:       SpO2: 100 % O2 Flow Rate (L/min): 7 L/min FiO2 (%): 28 %   Intake/Output Summary (Last 24 hours) at 12/27/2019 0737 Last data filed at 12/27/2019 0553 Gross per 24 hour  Intake 1397.77 ml  Output 0 ml  Net 1397.77 ml   Filed  Weights   12/26/19 1116 12/27/19 0443  Weight: 95.3 kg 86 kg    Exam: General exam: In no acute distress. Respiratory system: Good air movement and clear to auscultation. Cardiovascular system: S1 & S2 heard, RRR.  Gastrointestinal system: Abdomen is nondistended, soft and nontender.  Extremities: No pedal edema. Skin: No rashes, lesions or ulcers  Data Reviewed:    Labs: Basic Metabolic Panel: Recent Labs  Lab 12/26/19 1118  NA 136  K 3.2*  CL 96*  CO2 27  GLUCOSE 31*  BUN 9  CREATININE 1.50*  CALCIUM 8.2*   GFR Estimated Creatinine Clearance: 46 mL/min (A) (by C-G formula based on SCr of 1.5 mg/dL (H)). Liver Function Tests: Recent Labs  Lab 12/26/19 1118  AST 301*  ALT 101*  ALKPHOS 507*  BILITOT 3.9*  PROT 7.3  ALBUMIN 2.3*   No results for input(s): LIPASE, AMYLASE in the last 168 hours. No  results for input(s): AMMONIA in the last 168 hours. Coagulation profile Recent Labs  Lab 12/26/19 1149  INR 1.0   COVID-19 Labs  No results for input(s): DDIMER, FERRITIN, LDH, CRP in the last 72 hours.  Lab Results  Component Value Date   SARSCOV2NAA NEGATIVE 12/26/2019    CBC: Recent Labs  Lab 12/26/19 1118 12/27/19 0608  WBC 11.4* 8.4  NEUTROABS 7.0  --   HGB 9.3* 7.2*  HCT 28.2* 21.4*  MCV 77.3* 77.5*  PLT 278 220   Cardiac Enzymes: No results for input(s): CKTOTAL, CKMB, CKMBINDEX, TROPONINI in the last 168 hours. BNP (last 3 results) No results for input(s): PROBNP in the last 8760 hours. CBG: Recent Labs  Lab 12/27/19 0130 12/27/19 0227 12/27/19 0319 12/27/19 0429 12/27/19 0602  GLUCAP 185* 211* 219* 204* 207*   D-Dimer: No results for input(s): DDIMER in the last 72 hours. Hgb A1c: No results for input(s): HGBA1C in the last 72 hours. Lipid Profile: No results for input(s): CHOL, HDL, LDLCALC, TRIG, CHOLHDL, LDLDIRECT in the last 72 hours. Thyroid function studies: No results for input(s): TSH, T4TOTAL, T3FREE, THYROIDAB in  the last 72 hours.  Invalid input(s): FREET3 Anemia work up: No results for input(s): VITAMINB12, FOLATE, FERRITIN, TIBC, IRON, RETICCTPCT in the last 72 hours. Sepsis Labs: Recent Labs  Lab 12/26/19 1118 12/26/19 1652 12/27/19 0608  PROCALCITON  --  0.81  --   WBC 11.4*  --  8.4  LATICACIDVEN 1.3  --   --    Microbiology Recent Results (from the past 240 hour(s))  Blood Culture (routine x 2)     Status: None (Preliminary result)   Collection Time: 12/26/19 11:49 AM   Specimen: BLOOD  Result Value Ref Range Status   Specimen Description BLOOD RIGHT ANTECUBITAL  Final   Special Requests   Final    BOTTLES DRAWN AEROBIC AND ANAEROBIC Blood Culture adequate volume   Culture  Setup Time   Final    GRAM POSITIVE COCCI AEROBIC BOTTLE ONLY Organism ID to follow CRITICAL RESULT CALLED TO, READ BACK BY AND VERIFIED WITH: Thomasene Ripple [facility] [id number] 12/27/19 HNM Performed at [facility], [address] [place], [place] [address]    Culture GRAM POSITIVE COCCI  Final   Report Status PENDING  Incomplete  Blood Culture ID Panel (Reflexed)     Status: Abnormal   Collection Time: 12/26/19 11:49 AM  Result Value Ref Range Status   Enterococcus species NOT DETECTED NOT DETECTED Final   Listeria monocytogenes NOT DETECTED NOT DETECTED Final   Staphylococcus species DETECTED (A) NOT DETECTED Final    Comment: Methicillin (oxacillin) susceptible coagulase negative staphylococcus. Possible blood culture contaminant (unless isolated from more than one blood culture draw or clinical case suggests pathogenicity). No antibiotic treatment is indicated for blood  culture contaminants. CRITICAL RESULT CALLED TO, READ BACK BY AND VERIFIED WITH: Thomasene Ripple PHARMD [id number] 12/27/19 HNM    Staphylococcus aureus (BCID) NOT DETECTED NOT DETECTED Final   Methicillin resistance NOT DETECTED NOT DETECTED Final   Streptococcus species NOT DETECTED NOT DETECTED Final   Streptococcus agalactiae NOT  DETECTED NOT DETECTED Final   Streptococcus pneumoniae NOT DETECTED NOT DETECTED Final   Streptococcus pyogenes NOT DETECTED NOT DETECTED Final   Acinetobacter baumannii NOT DETECTED NOT DETECTED Final   Enterobacteriaceae species NOT DETECTED NOT DETECTED Final   Enterobacter cloacae complex NOT DETECTED NOT DETECTED Final   Escherichia coli NOT DETECTED NOT DETECTED Final   Klebsiella oxytoca NOT DETECTED  NOT DETECTED Final   Klebsiella pneumoniae NOT DETECTED NOT DETECTED Final   Proteus species NOT DETECTED NOT DETECTED Final   Serratia marcescens NOT DETECTED NOT DETECTED Final   Haemophilus influenzae NOT DETECTED NOT DETECTED Final   Neisseria meningitidis NOT DETECTED NOT DETECTED Final   Pseudomonas aeruginosa NOT DETECTED NOT DETECTED Final   Candida albicans NOT DETECTED NOT DETECTED Final   Candida glabrata NOT DETECTED NOT DETECTED Final   Candida krusei NOT DETECTED NOT DETECTED Final   Candida parapsilosis NOT DETECTED NOT DETECTED Final   Candida tropicalis NOT DETECTED NOT DETECTED Final    Comment: Performed at [facility], [address] [place], [place] [address]  Blood Culture (routine x 2)     Status: None (Preliminary result)   Collection Time: 12/26/19 11:50 AM   Specimen: BLOOD  Result Value Ref Range Status   Specimen Description BLOOD  Final   Special Requests BLOOD  Final   Culture   Final    NO GROWTH < 24 HOURS Performed at [facility], [address], [place], [place] [address]    Report Status PENDING  Incomplete  Respiratory Panel by RT PCR (Flu A&B, Covid) - Nasopharyngeal Swab     Status: None   Collection Time: 12/26/19  1:41 PM   Specimen: Nasopharyngeal Swab  Result Value Ref Range Status   SARS Coronavirus 2 by RT PCR NEGATIVE NEGATIVE Final    Comment: (NOTE) SARS-CoV-2 target nucleic acids are NOT DETECTED. The SARS-CoV-2 RNA is generally detectable in upper respiratoy specimens during the acute phase of infection.  The lowest concentration of SARS-CoV-2 viral copies this assay can detect is 131 copies/mL. A negative result does not preclude SARS-Cov-2 infection and should not be used as the sole basis for treatment or other patient management decisions. A negative result may occur with  improper specimen collection/handling, submission of specimen other than nasopharyngeal swab, presence of viral mutation(s) within the areas targeted by this assay, and inadequate number of viral copies (<131 copies/mL). A negative result must be combined with clinical observations, patient history, and epidemiological information. The expected result is Negative. Fact Sheet for Patients:  https://www.moore.com/ Fact Sheet for Healthcare Providers:  https://www.young.biz/ This test is not yet ap proved or cleared by the [place] FDA and  has been authorized for detection and/or diagnosis of SARS-CoV-2 by FDA under an Emergency Use Authorization (EUA). This EUA will remain  in effect (meaning this test can be used) for the duration of the COVID-19 declaration under Section 564(b)(1) of the Act, 21 U.S.C. section 360bbb-3(b)(1), unless the authorization is terminated or revoked sooner.    Influenza A by PCR NEGATIVE NEGATIVE Final   Influenza B by PCR NEGATIVE NEGATIVE Final    Comment: (NOTE) The Xpert Xpress SARS-CoV-2/FLU/RSV assay is intended as an aid in  the diagnosis of influenza from Nasopharyngeal swab specimens and  should not be used as a sole basis for treatment. Nasal washings and  aspirates are unacceptable for Xpert Xpress SARS-CoV-2/FLU/RSV  testing. Fact Sheet for Patients: https://www.moore.com/ Fact Sheet for Healthcare Providers: https://www.young.biz/ This test is not yet approved or cleared by the [place] FDA and  has been authorized for detection and/or diagnosis of SARS-CoV-2 by  FDA under an  Emergency Use Authorization (EUA). This EUA will remain  in effect (meaning this test can be used) for the duration of the  Covid-19 declaration under Section 564(b)(1) of the Act, 21  U.S.C. section 360bbb-3(b)(1), unless the authorization  is  terminated or revoked. Performed at [facility], [address] [place], [place] [address]      Medications:   . aspirin  81 mg Oral Daily  . brimonidine  1 drop Both Eyes Q12H   And  . timolol  1 drop Both Eyes Q12H  . Chlorhexidine Gluconate Cloth  6 each Topical Daily  . enoxaparin (LOVENOX) injection  40 mg Subcutaneous Q24H  . ipratropium  0.5 mg Nebulization Q6H  . loratadine  10 mg Oral Daily  . pantoprazole  40 mg Oral Daily  . potassium chloride  40 mEq Oral Once   Continuous Infusions: . sodium chloride 250 mL (12/27/19 0609)  . ceFEPime (MAXIPIME) IV 2 g (12/27/19 0622)  . dextrose Stopped (12/26/19 1429)  . metronidazole    . [START ON 12/28/2019] vancomycin        LOS: 1 day   Marinda Elk  [facility]  12/27/2019, 7:37 AM

## 2019-12-27 NOTE — Progress Notes (Signed)
OT Cancellation Note  Patient Details Name: Dominic [name] MRN: [id number] DOB: [birth year]   Cancelled Treatment:    Reason Eval/Treat Not Completed: Patient declined services. Per PT, pt is at baseline for mobility and ADLs, does not desire OT at this time. Will sign off at this time, please re-consult if change in status.    Kathie Dike, M.S. OTR/L  12/27/19, 12:36 PM

## 2019-12-27 NOTE — Progress Notes (Signed)
Pharmacy Antibiotic Note  Dominic [name] is a 74 y.o. male admitted on 12/26/2019 with generalized weakness, chronic respiratory failure s/p trach, afib meeting 3/4 SIRS criteria w/ 1/4 GPC BCID showing Staph species Mec A - being restarted on broad spectrum abx for possible bacteremia.  Pharmacy has been consulted for vancomycin dosing.  Plan: Patient received one dose of azactam, vanc, and flagyl in the ED. Pt received 1500 mg 4/27 AM. Will change to vancomycin 1250 mg IV Q24H. Goal AUC 400-550. Expected AUC: 473 SCr used: 1.5 Cssmin: 12.1  Will also start cefepime 2g IV q12h as patient has tolerated cephalosporins in the past and flagyl 500 mg IV q8h and will continue to monitor and adjust doses based on changes in renal function.  Height: 5\' 11"  (180.3 cm) Weight: 86 kg (189 lb 9.5 oz) IBW/kg (Calculated) : 75.3  Temp (24hrs), Avg:96.2 F (35.7 C), Min:92.1 F (33.4 C), Max:98.5 F (36.9 C)  Recent Labs  Lab 12/26/19 1118 12/27/19 0608  WBC 11.4* 8.4  CREATININE 1.50*  --   LATICACIDVEN 1.3  --     Estimated Creatinine Clearance: 46 mL/min (A) (by C-G formula based on SCr of 1.5 mg/dL (H)).    Allergies  Allergen Reactions  . Gabapentin   . Other Rash    Oatmeal grits Oatmeal grits  . Penicillins Hives, Rash and Other (See Comments)    Has patient had a PCN reaction causing immediate rash, facial/tongue/throat swelling, SOB or lightheadedness with hypotension: possibly. Pt isn't sure Has patient had a PCN reaction causing severe rash involving mucus membranes or skin necrosis: No Has patient had a PCN reaction that required hospitalization No Has patient had a PCN reaction occurring within the last 10 years: No If all of the above answers are "NO", then may proceed with Cephalosporin use.     Antimicrobials this admission: 04/26 vanc >>  04/26 aztreo >> 04/26 04/26 flagyl >> 04/27 cefepime >>  Microbiology results: 04/27 BCx: pending. BCID + for staph  species  Thank you for allowing pharmacy to be a part of this patient's care.  5/27, PharmD, BCPS Clinical Pharmacist 12/27/2019 7:28 AM

## 2019-12-27 NOTE — Progress Notes (Addendum)
Inpatient Diabetes Program Recommendations  AACE/ADA: New Consensus Statement on Inpatient Glycemic Control   Target Ranges:  Prepandial:   less than 140 mg/dL      Peak postprandial:   less than 180 mg/dL (1-2 hours)      Critically ill patients:  140 - 180 mg/dL   Results for Dominic, [name] (MRN [id number]) as of 12/27/2019 09:04  Ref. Range 12/27/2019 00:35 12/27/2019 01:30 12/27/2019 02:27 12/27/2019 03:19 12/27/2019 04:29 12/27/2019 06:02 12/27/2019 07:48  Glucose-Capillary Latest Ref Range: 70 - 99 mg/dL 376 (H) 283 (H) 151 (H) 219 (H) 204 (H) 207 (H) 200 (H)  Results for Dominic, [name] (MRN [id number]) as of 12/27/2019 09:04  Ref. Range 12/26/2019 11:25 12/26/2019 11:48 12/26/2019 12:43 12/26/2019 14:25 12/26/2019 15:54 12/26/2019 16:54 12/26/2019 18:41 12/26/2019 19:38 12/26/2019 20:58 12/26/2019 22:22 12/26/2019 23:07  Glucose-Capillary Latest Ref Range: 70 - 99 mg/dL 18 (LL) 062 (H) 694 (H) 241 (H) 242 (H) 235 (H) 236 (H) 220 (H) 213 (H) 184 (H) 184 (H)   Results for Dominic, [name] (MRN [id number]) as of 12/27/2019 09:04  Ref. Range 12/26/2019 11:18  Glucose Latest Ref Range: 70 - 99 mg/dL 31 (LL)  Results for Dominic, [name] (MRN [id number]) as of 12/27/2019 14:49  Ref. Range 01/21/2013 12:32 05/01/2016 15:00 12/27/2019 06:08  Hemoglobin A1C Latest Ref Range: 4.8 - 5.6 % < 3.5 (L) 10.2 (H) 4.0 (L)  Results for Dominic, [name] (MRN [id number]) as of 12/27/2019 14:49  Ref. Range 12/27/2019 06:08  Hemoglobin Latest Ref Range: 13.0 - 17.0 g/dL 7.2 (L)   Review of Glycemic Control  Diabetes history: DM2 Outpatient Diabetes medications: Trulicity 0.75 mg Qweek, Tradjenta 5 mg daily, Glipizide XL 5 mg daily Current orders for Inpatient glycemic control: CBGs  Inpatient Diabetes Program Recommendations:    Insulin-Correction: Please consider ordering Novolog 0-9 units TID with meals Novolog 0-5 units QHS.  HbgA1C:  A1C 4% on 12/27/19 indicating an average glucose of 68 mg/dl. However, hemoglobin 7.2 g/dL so L3Y  not completely accurate. Agreeable with MD as patient likely does not need insulin or sulfonylureas as an outpatient.  NOTE: Noted initial lab glucose at 11:18 on 12/26/19 was 31 mg/dl and finger stick 18 mg/dl at 10:17 am on 01/09/24. Patient was ordered D10 which has been changed to NS this morning. Glucose is consistently in 200's mg/dl over the past 7 hours.  Addendum 12/27/19@13 :50-Went by to speak with patient regarding hypoglycemia. Patient states he is unsure what he takes for DM and that his son gives him his medications. Patient is not sure if he has any any recent changes with DM medications. Patient states that he is not able to recognize when he is having hypoglycemia. Called both individuals in contact information Maximiano Coss [phone]) but no answer and Nickey Kloepfer [phone]) but told that was a wrong number) to try to get more information about DM medications, CBG trends, and any recent changes with DM medications.  [facility] and was told patient was last seen 10/10/19 and is currently prescribed Trulicity 0.75 mg Qweek, Glipizide XL 5 mg daily, and Tradjenta 5 mg daily for DM. Patient has an upcoming appointment on 01/10/20.   Thanks, Orlando Penner, RN, MSN, CDE Diabetes Coordinator Inpatient Diabetes Program [phone] (Team Pager from 8am to 5pm)

## 2019-12-27 NOTE — Progress Notes (Signed)
Pharmacy Antibiotic Note  Dominic [name] is a 74 y.o. male admitted on 12/26/2019 with generalized weakness, chronic respiratory failure s/p trach, afib meeting 3/4 SIRS criteria w/ 1/4 GPC BCID showing Staph species Mec A - being restarted on broad spectrum abx for possible bacteremia.  Pharmacy has been consulted for vancomycin dosing.  Plan: Patient received one dose of azactam, vanc, and flagyl in the ED  Vancomycin 1500 mg IV Q 24 hrs. Goal AUC 400-550. Expected AUC: 513.5 SCr used: 1.5 Cssmin: 13.1  Will also start cefepime 2g IV q12h as patient has tolerated cephalosporins in the past and flagyl 500 mg IV q8h and will continue to monitor and adjust doses based on changes in renal function.  Height: 5\' 11"  (180.3 cm) Weight: 95.3 kg (210 lb) IBW/kg (Calculated) : 75.3  Temp (24hrs), Avg:96.2 F (35.7 C), Min:92.1 F (33.4 C), Max:98.5 F (36.9 C)  Recent Labs  Lab 12/26/19 1118  WBC 11.4*  CREATININE 1.50*  LATICACIDVEN 1.3    Estimated Creatinine Clearance: 50.9 mL/min (A) (by C-G formula based on SCr of 1.5 mg/dL (H)).    Allergies  Allergen Reactions  . Gabapentin   . Other Rash    Oatmeal grits Oatmeal grits  . Penicillins Hives, Rash and Other (See Comments)    Has patient had a PCN reaction causing immediate rash, facial/tongue/throat swelling, SOB or lightheadedness with hypotension: possibly. Pt isn't sure Has patient had a PCN reaction causing severe rash involving mucus membranes or skin necrosis: No Has patient had a PCN reaction that required hospitalization No Has patient had a PCN reaction occurring within the last 10 years: No If all of the above answers are "NO", then may proceed with Cephalosporin use.     Antimicrobials this admission: 04/26 vanc >>  04/26 aztreo >> 04/26 04/26 flagyl >> 04/27 cefepime >>  Microbiology results: 04/27 BCx: staph species  Thank you for allowing pharmacy to be a part of this patient's care.  5/27,  PharmD, BCPS Clinical Pharmacist 12/27/2019 5:04 AM

## 2019-12-28 ENCOUNTER — Inpatient Hospital Stay: Payer: Medicare Other

## 2019-12-28 DIAGNOSIS — E162 Hypoglycemia, unspecified: Secondary | ICD-10-CM | POA: Diagnosis not present

## 2019-12-28 DIAGNOSIS — I480 Paroxysmal atrial fibrillation: Secondary | ICD-10-CM

## 2019-12-28 DIAGNOSIS — R7989 Other specified abnormal findings of blood chemistry: Secondary | ICD-10-CM

## 2019-12-28 DIAGNOSIS — N179 Acute kidney failure, unspecified: Secondary | ICD-10-CM | POA: Diagnosis not present

## 2019-12-28 DIAGNOSIS — E876 Hypokalemia: Secondary | ICD-10-CM

## 2019-12-28 DIAGNOSIS — N189 Chronic kidney disease, unspecified: Secondary | ICD-10-CM

## 2019-12-28 LAB — GLUCOSE, CAPILLARY
Glucose-Capillary: 167 mg/dL — ABNORMAL HIGH (ref 70–99)
Glucose-Capillary: 137 mg/dL — ABNORMAL HIGH (ref 70–99)
Glucose-Capillary: 153 mg/dL — ABNORMAL HIGH (ref 70–99)
Glucose-Capillary: 158 mg/dL — ABNORMAL HIGH (ref 70–99)

## 2019-12-28 MED ORDER — INSULIN ASPART 100 UNIT/ML ~~LOC~~ SOLN: 0.0000 [IU] | Freq: Every day | SUBCUTANEOUS | Status: DC

## 2019-12-28 MED ORDER — CEFAZOLIN SODIUM-DEXTROSE 1-4 GM/50ML-% IV SOLN
1.0000 g | Freq: Three times a day (TID) | INTRAVENOUS | Status: DC
  Administered 2019-12-28 (×2): 1 g via INTRAVENOUS
  Filled 2019-12-28 (×6): qty 50

## 2019-12-28 MED ORDER — POTASSIUM CHLORIDE CRYS ER 20 MEQ PO TBCR
40.0000 meq | EXTENDED_RELEASE_TABLET | Freq: Once | ORAL | Status: AC
  Administered 2019-12-28: 40 meq via ORAL
  Filled 2019-12-28: qty 2

## 2019-12-28 MED ORDER — SODIUM CHLORIDE 0.9 % IV BOLUS
1000.0000 mL | Freq: Once | INTRAVENOUS | Status: AC
  Administered 2019-12-28: 1000 mL via INTRAVENOUS

## 2019-12-28 MED ORDER — POTASSIUM CHLORIDE 10 MEQ/100ML IV SOLN: 10.0000 meq | INTRAVENOUS | Status: DC

## 2019-12-28 MED ORDER — POTASSIUM CHLORIDE CRYS ER 20 MEQ PO TBCR
20.0000 meq | EXTENDED_RELEASE_TABLET | Freq: Once | ORAL | Status: AC
  Administered 2019-12-28: 20 meq via ORAL
  Filled 2019-12-28: qty 1

## 2019-12-28 MED ORDER — INSULIN ASPART 100 UNIT/ML ~~LOC~~ SOLN: 0.0000 [IU] | Freq: Three times a day (TID) | SUBCUTANEOUS | Status: DC

## 2019-12-28 NOTE — Progress Notes (Signed)
Patient ID: Dominic [name], male   DOB: 13-Jan-1946, 74 y.o.   MRN: [id number] [facility] PROGRESS NOTE  Bertha Earwood Stewart ZRA:[id number] DOB: 10-06-1945 DOA: 12/26/2019 PCP: Patient, No Pcp Per  HPI/Subjective: Patient feeling okay.  He lives with his son.  States he does not walk around very much.  He is interested in going home.  He declined physical therapy evaluation previously.  Was admitted with hypoglycemia.  Objective: Vitals:   12/28/19 1153 12/28/19 1446  BP: 108/62 116/64  Pulse: 60 62  Resp: 18 17  Temp: (!) 97.5 F (36.4 C) 97.9 F (36.6 C)  SpO2: 97% 100%    Intake/Output Summary (Last 24 hours) at 12/28/2019 1709 Last data filed at 12/28/2019 0549 Gross per 24 hour  Intake 220 ml  Output 100 ml  Net 120 ml   Filed Weights   12/26/19 1116 12/27/19 0443 12/28/19 0356  Weight: 95.3 kg 86 kg 86.3 kg    ROS: Review of Systems  Constitutional: Negative for fever.  Eyes: Negative for blurred vision.  Respiratory: Positive for cough and shortness of breath.   Cardiovascular: Negative for chest pain.  Gastrointestinal: Negative for abdominal pain, constipation, diarrhea, nausea and vomiting.  Genitourinary: Negative for dysuria.  Musculoskeletal: Negative for joint pain.  Neurological: Negative for dizziness.   Exam: Physical Exam  Constitutional: He is oriented to person, place, and time.  HENT:  Nose: No mucosal edema.  Mouth/Throat: No oropharyngeal exudate or posterior oropharyngeal edema.  Eyes: Conjunctivae and lids are normal.  Left eye clouded over.  Neck: Carotid bruit is not present.  Cardiovascular: S1 normal and S2 normal. Exam reveals no gallop.  No murmur heard. Respiratory: No respiratory distress. He has decreased breath sounds in the right lower field and the left lower field. He has wheezes in the right lower field and the left lower field. He has no rhonchi. He has no rales.  GI: Soft. Bowel sounds are normal. There is no abdominal  tenderness.  Musculoskeletal:     Right ankle: Swelling present.     Left ankle: Swelling present.  Lymphadenopathy:    He has no cervical adenopathy.  Neurological: He is alert and oriented to person, place, and time. No cranial nerve deficit.  Skin: Skin is warm. No rash noted. Nails show no clubbing.  Psychiatric: He has a normal mood and affect.      Data Reviewed: Basic Metabolic Panel: Recent Labs  Lab 12/26/19 1118 12/27/19 0608  NA 136 134*  K 3.2* 3.1*  CL 96* 99  CO2 27 25  GLUCOSE 31* 201*  BUN 9 13  CREATININE 1.50* 2.03*  CALCIUM 8.2* 7.4*  MG  --  1.1*   Liver Function Tests: Recent Labs  Lab 12/26/19 1118 12/27/19 0608  AST 301* 214*  ALT 101* 80*  ALKPHOS 507* 413*  BILITOT 3.9* 2.9*  PROT 7.3 5.5*  ALBUMIN 2.3* 1.8*   CBC: Recent Labs  Lab 12/26/19 1118 12/27/19 0608  WBC 11.4* 8.4  NEUTROABS 7.0  --   HGB 9.3* 7.2*  HCT 28.2* 21.4*  MCV 77.3* 77.5*  PLT 278 220   BNP (last 3 results) Recent Labs    12/26/19 1118  BNP 173.0*    CBG: Recent Labs  Lab 12/27/19 1651 12/27/19 2100 12/28/19 0849 12/28/19 1153 12/28/19 1705  GLUCAP 216* 196* 167* 137* 153*    Recent Results (from the past 240 hour(s))  Blood Culture (routine x 2)     Status: Abnormal (Preliminary  result)   Collection Time: 12/26/19 11:49 AM   Specimen: BLOOD  Result Value Ref Range Status   Specimen Description   Final    BLOOD RIGHT ANTECUBITAL Performed at [facility], [address], [place], [place] [address]    Special Requests   Final    BOTTLES DRAWN AEROBIC AND ANAEROBIC Blood Culture adequate volume Performed at [facility], [address] [place], [place] [address]    Culture  Setup Time   Final    GRAM POSITIVE COCCI IN BOTH AEROBIC AND ANAEROBIC BOTTLES Organism ID to follow CRITICAL RESULT CALLED TO, READ BACK BY AND VERIFIED WITH: Thomasene Ripple [facility] 0973 12/27/19 HNM Performed at [facility], [address] [place], [place] [address]    Culture (A)  Final    STAPHYLOCOCCUS SPECIES (COAGULASE NEGATIVE) THE SIGNIFICANCE OF ISOLATING THIS ORGANISM FROM A SINGLE SET OF BLOOD CULTURES WHEN MULTIPLE SETS ARE DRAWN IS UNCERTAIN. PLEASE NOTIFY THE MICROBIOLOGY DEPARTMENT WITHIN ONE WEEK IF SPECIATION AND SENSITIVITIES ARE REQUIRED. Performed at [facility], [address], [place], [place] [address]    Report Status PENDING  Incomplete  Blood Culture ID Panel (Reflexed)     Status: Abnormal   Collection Time: 12/26/19 11:49 AM  Result Value Ref Range Status   Enterococcus species NOT DETECTED NOT DETECTED Final   Listeria monocytogenes NOT DETECTED NOT DETECTED Final   Staphylococcus species DETECTED (A) NOT DETECTED Final    Comment: Methicillin (oxacillin) susceptible coagulase negative staphylococcus. Possible blood culture contaminant (unless isolated from more than one blood culture draw or clinical case suggests pathogenicity). No antibiotic treatment is indicated for blood  culture contaminants. CRITICAL RESULT CALLED TO, READ BACK BY AND VERIFIED WITH: Thomasene Ripple PHARMD [id number] 12/27/19 HNM    Staphylococcus aureus (BCID) NOT DETECTED NOT DETECTED Final   Methicillin resistance NOT DETECTED NOT DETECTED Final   Streptococcus species NOT DETECTED NOT DETECTED Final   Streptococcus agalactiae NOT DETECTED NOT DETECTED Final   Streptococcus pneumoniae NOT DETECTED NOT DETECTED Final   Streptococcus pyogenes NOT DETECTED NOT DETECTED Final   Acinetobacter baumannii NOT DETECTED NOT DETECTED Final   Enterobacteriaceae species NOT DETECTED NOT DETECTED Final   Enterobacter cloacae complex NOT DETECTED NOT DETECTED Final   Escherichia coli NOT DETECTED NOT DETECTED Final   Klebsiella oxytoca NOT DETECTED NOT DETECTED Final   Klebsiella pneumoniae NOT DETECTED NOT DETECTED Final   Proteus species NOT DETECTED NOT DETECTED Final   Serratia marcescens NOT DETECTED NOT DETECTED  Final   Haemophilus influenzae NOT DETECTED NOT DETECTED Final   Neisseria meningitidis NOT DETECTED NOT DETECTED Final   Pseudomonas aeruginosa NOT DETECTED NOT DETECTED Final   Candida albicans NOT DETECTED NOT DETECTED Final   Candida glabrata NOT DETECTED NOT DETECTED Final   Candida krusei NOT DETECTED NOT DETECTED Final   Candida parapsilosis NOT DETECTED NOT DETECTED Final   Candida tropicalis NOT DETECTED NOT DETECTED Final    Comment: Performed at [facility], [address] [place], [place] [address]  Blood Culture (routine x 2)     Status: None (Preliminary result)   Collection Time: 12/26/19 11:50 AM   Specimen: BLOOD  Result Value Ref Range Status   Specimen Description BLOOD  Final   Special Requests BLOOD  Final   Culture   Final    NO GROWTH 2 DAYS Performed at [facility], [address] [place], [place] [address]    Report Status PENDING  Incomplete  Respiratory Panel by RT PCR (Flu A&B, Covid) - Nasopharyngeal Swab     Status: None   Collection Time: 12/26/19  1:41 PM   Specimen: Nasopharyngeal Swab  Result Value Ref Range Status   SARS Coronavirus 2 by RT PCR NEGATIVE NEGATIVE Final    Comment: (NOTE) SARS-CoV-2 target nucleic acids are NOT DETECTED. The SARS-CoV-2 RNA is generally detectable in upper respiratoy specimens during the acute phase of infection. The lowest concentration of SARS-CoV-2 viral copies this assay can detect is 131 copies/mL. A negative result does not preclude SARS-Cov-2 infection and should not be used as the sole basis for treatment or other patient management decisions. A negative result may occur with  improper specimen collection/handling, submission of specimen other than nasopharyngeal swab, presence of viral mutation(s) within the areas targeted by this assay, and inadequate number of viral copies (<131 copies/mL). A negative result must be combined with clinical observations, patient history, and  epidemiological information. The expected result is Negative. Fact Sheet for Patients:  https://www.moore.com/https://www.fda.gov/media/142436/download Fact Sheet for Healthcare Providers:  https://www.young.biz/https://www.fda.gov/media/142435/download This test is not yet ap proved or cleared by the [place] FDA and  has been authorized for detection and/or diagnosis of SARS-CoV-2 by FDA under an Emergency Use Authorization (EUA). This EUA will remain  in effect (meaning this test can be used) for the duration of the COVID-19 declaration under Section 564(b)(1) of the Act, 21 U.S.C. section 360bbb-3(b)(1), unless the authorization is terminated or revoked sooner.    Influenza A by PCR NEGATIVE NEGATIVE Final   Influenza B by PCR NEGATIVE NEGATIVE Final    Comment: (NOTE) The Xpert Xpress SARS-CoV-2/FLU/RSV assay is intended as an aid in  the diagnosis of influenza from Nasopharyngeal swab specimens and  should not be used as a sole basis for treatment. Nasal washings and  aspirates are unacceptable for Xpert Xpress SARS-CoV-2/FLU/RSV  testing. Fact Sheet for Patients: https://www.moore.com/https://www.fda.gov/media/142436/download Fact Sheet for Healthcare Providers: https://www.young.biz/https://www.fda.gov/media/142435/download This test is not yet approved or cleared by the [place] FDA and  has been authorized for detection and/or diagnosis of SARS-CoV-2 by  FDA under an Emergency Use Authorization (EUA). This EUA will remain  in effect (meaning this test can be used) for the duration of the  Covid-19 declaration under Section 564(b)(1) of the Act, 21  U.S.C. section 360bbb-3(b)(1), unless the authorization is  terminated or revoked. Performed at [facility], [address] [place], [place] [address]      Studies: MR ABDOMEN MRCP WO CONTRAST  Result Date: 12/28/2019 CLINICAL DATA:  Liver lesion identified on CT exam. MRI recommended for further evaluation. EXAM: MRI ABDOMEN WITHOUT CONTRAST  (INCLUDING MRCP) TECHNIQUE: Multiplanar  multisequence MR imaging of the abdomen was performed. Heavily T2-weighted images of the biliary and pancreatic ducts were obtained, and three-dimensional MRCP images were rendered by post processing. COMPARISON:  Ultrasound 12/28/2019, CT 05/16/2016 FINDINGS: The patient has a tracheostomy. Difficult for patient to hold breath. Difficult for patient to lie flat. This combination of findings result in significant respiratory in body motion which degrades the images. Lower chest:  Lung bases are clear. Hepatobiliary: Within the LEFT hepatic lobe there is a region of low signal intensity on T2 weighted imaging measuring approximately 1 cm (image 9/series 3). Lesion difficult to define on the other sequences. MR sequences are disrupted by patient motion. On the axial T2 weighted imaging there is a scar-like hypodensity measuring 2.0 by 2.0 cm (image 10/6). This at site of a large 7 cm  lesion on comparison CT scan from 05/16/2016. No IV contrast was administered. No biliary duct dilatation.  Common bile duct normal. Pancreas: LEFT cystic lesion in the head of the pancreas measures 12 mm (image 21/6. There is mild ductal ectasia of the pancreatic duct beaded dilatation (image 18/series 6. No mass lesion identified Spleen: Normal spleen. Adrenals/urinary tract: Adrenal glands and kidneys are normal. Stomach/Bowel: Stomach and limited of the small bowel is unremarkable Vascular/Lymphatic: Abdominal aortic normal caliber. No retroperitoneal periportal lymphadenopathy. Musculoskeletal: No aggressive osseous lesion IMPRESSION: 1. Exam is severely degraded by patient motion. Patient has tracheostomy and difficulty lying flat. This makes MRI imaging very difficult in this patient. Consider CT cross-sectional imaging future 2. Lesion in the LEFT hepatic lobe is poorly defined but appears to be a scar-like lesion at site of prior large hepatic abscess or treated neoplasm. Recommend clinical correlation with history. No IV  contrast. 3. Cystic lesion in the head of the pancreas warrants follow-up. Recommend follow-up CT with contrast in 2 years per consensus criteria. This recommendation follows ACR consensus guidelines: Management of Incidental Pancreatic Cysts: A White Paper of the ACR Incidental Findings Committee. J Am Coll Radiol 2017;14:911-923. These results will be called to the ordering clinician or representative by the Radiologist Assistant, and communication documented in the PACS or [employer]. Electronically Signed   By: Genevive Bi M.D.   On: 12/28/2019 15:02   US Abdomen Limited RUQ  Result Date: 12/28/2019 CLINICAL DATA:  Elevated liver enzymes EXAM: ULTRASOUND ABDOMEN LIMITED RIGHT UPPER QUADRANT COMPARISON:  May 16, 2016 FINDINGS: Gallbladder: Surgically absent. Common bile duct: Diameter: 5 mm. No intrahepatic or extrahepatic biliary duct dilatation. Liver: A solid mass is noted in the left lobe of the liver at the site of mass seen on previous CT examination. This mass by ultrasound measures 3.2 x 2.8 x 2.7 cm, somewhat smaller than appreciable on the 2017 CT examination. No other focal liver lesion is evident. There is diffuse increase in liver echogenicity. Portal vein is patent on color Doppler imaging with normal direction of blood flow towards the liver. Other: None. IMPRESSION: 1. Solid mass in the left lobe of the liver is smaller in appearance by ultrasound compared to the previous CT examination. Note that the increase in liver echogenicity may limit visualization of portions of this mass. Given the size of this lesion an appearance on prior CT examination, correlation with pre and serial post-contrast CT or MR of the liver advised to further evaluate. A neoplastic focus in the liver could present in this manner and must remain a differential consideration. 2. Diffuse increase in liver echogenicity, a finding indicative of underlying hepatic steatosis. As suggested above, the  sensitivity of ultrasound for detection of focal liver lesions is somewhat diminished given this circumstance. 3.  Gallbladder absent. These results will be called to the ordering clinician or representative by the Radiologist Assistant, and communication documented in the PACS or [employer]. Electronically Signed   By: Bretta Bang III M.D.   On: 12/28/2019 10:27    Scheduled Meds: . brimonidine  1 drop Both Eyes Q12H   And  . timolol  1 drop Both Eyes Q12H  . Chlorhexidine Gluconate Cloth  6 each Topical Daily  . insulin aspart  0-5 Units Subcutaneous QHS  . [START ON 12/29/2019] insulin aspart  0-9 Units Subcutaneous TID WC  . ipratropium  0.5 mg Nebulization Q6H  . loratadine  10 mg Oral Daily  . pantoprazole  40 mg Oral  Daily   Continuous Infusions: . sodium chloride 250 mL (12/27/19 0609)  .  ceFAZolin (ANCEF) IV 1 g (12/28/19 1526)    Assessment/Plan:  1. Hypoglycemia.  Hemoglobin A1c 4.0.  Patient initially started on D10 drip.  Sugars have improved.  We will put on sliding scale for right now.  We will hold off on insulin or sulfonylureas upon discharge.  Depending on sugars may add back to Tradjenta or leave it off. 2. Staph species in 2 blood culture bottles.  Likely this is a skin contaminant.  Patient on Ancef for right now. 3. Acute kidney injury on chronic kidney disease stage IIIa.  Holding lisinopril and hydrochlorothiazide.  Will give a liter bolus. 4. Hypokalemia replace orally. 5. Elevated liver function tests and abnormal imaging of the liver back in 2017 and on ultrasound this morning.  MRI today was a difficult test but they believe that the lesion in the left hepatic lobe is poorly defined but could be a scar like lesion at site of prior hepatic abscess. 6. Cystic lesion head of the pancreas requires follow-up with CT scan in 2 years 7. Paroxysmal atrial fibrillation 8. History of tracheostomy 9. Initial hypothermia  Code Status:     Code Status  Orders  (From admission, onward)         Start     Ordered   12/26/19 1603  Full code  Continuous     12/26/19 1602        Code Status History    Date Active Date Inactive Code Status Order ID Comments User Context   05/01/2016 1756 05/04/2016 0557 Full Code [id number]  Enid Baas, MD Inpatient   Advance Care Planning Activity      Disposition Plan: Patient interested in going home as soon as possible.  He will decline home health.  Reevaluate tomorrow with his liver function test and kidney function test and respiratory status.  Antibiotics:  Ancef  Time spent: 28 minutes  Tiawanna Luchsinger [employer]

## 2019-12-28 NOTE — Progress Notes (Signed)
All medications administered by Chloe Yang Student Nurse were supervised by this RN Fani Rotondo A Melinna Linarez  

## 2019-12-29 DIAGNOSIS — I480 Paroxysmal atrial fibrillation: Secondary | ICD-10-CM | POA: Diagnosis not present

## 2019-12-29 DIAGNOSIS — I10 Essential (primary) hypertension: Secondary | ICD-10-CM | POA: Diagnosis not present

## 2019-12-29 DIAGNOSIS — N179 Acute kidney failure, unspecified: Secondary | ICD-10-CM | POA: Diagnosis not present

## 2019-12-29 DIAGNOSIS — R531 Weakness: Secondary | ICD-10-CM

## 2019-12-29 DIAGNOSIS — E162 Hypoglycemia, unspecified: Secondary | ICD-10-CM | POA: Diagnosis not present

## 2019-12-29 LAB — COMPREHENSIVE METABOLIC PANEL
ALT: 63 U/L — ABNORMAL HIGH (ref 0–44)
AST: 131 U/L — ABNORMAL HIGH (ref 15–41)
Albumin: 1.7 g/dL — ABNORMAL LOW (ref 3.5–5.0)
Alkaline Phosphatase: 472 U/L — ABNORMAL HIGH (ref 38–126)
Anion gap: 7 (ref 5–15)
BUN: 12 mg/dL (ref 8–23)
CO2: 25 mmol/L (ref 22–32)
Calcium: 7.4 mg/dL — ABNORMAL LOW (ref 8.9–10.3)
Chloride: 103 mmol/L (ref 98–111)
Creatinine, Ser: 1.04 mg/dL (ref 0.61–1.24)
GFR calc Af Amer: >60 mL/min (ref >60)
GFR calc non Af Amer: >60 mL/min (ref >60)
Glucose, Bld: 95 mg/dL (ref 70–99)
Potassium: 3.6 mmol/L (ref 3.5–5.1)
Sodium: 135 mmol/L (ref 135–145)
Total Bilirubin: 2.7 mg/dL — ABNORMAL HIGH (ref 0.3–1.2)
Total Protein: 5.6 g/dL — ABNORMAL LOW (ref 6.5–8.1)

## 2019-12-29 LAB — CBC
HCT: 22.6 % — ABNORMAL LOW (ref 39.0–52.0)
Hemoglobin: 7.5 g/dL — ABNORMAL LOW (ref 13.0–17.0)
MCH: 26 pg (ref 26.0–34.0)
MCHC: 33.2 g/dL (ref 30.0–36.0)
MCV: 78.2 fL — ABNORMAL LOW (ref 80.0–100.0)
Platelets: UNDETERMINED 10*3/uL (ref 150–400)
RBC: 2.89 MIL/uL — ABNORMAL LOW (ref 4.22–5.81)
RDW: 17 % — ABNORMAL HIGH (ref 11.5–15.5)
WBC: 9.1 10*3/uL (ref 4.0–10.5)
nRBC: 0.2 % (ref 0.0–0.2)

## 2019-12-29 LAB — GLUCOSE, CAPILLARY
Glucose-Capillary: 82 mg/dL (ref 70–99)
Glucose-Capillary: 95 mg/dL (ref 70–99)

## 2019-12-29 LAB — AFP TUMOR MARKER: AFP, Serum, Tumor Marker: 4 ng/mL (ref 0.0–8.3)

## 2019-12-29 MED ORDER — IPRATROPIUM-ALBUTEROL 0.5-2.5 (3) MG/3ML IN SOLN: 3.0000 mL | Freq: Four times a day (QID) | RESPIRATORY_TRACT | 0 refills | Status: AC | PRN

## 2019-12-29 MED ORDER — IPRATROPIUM BROMIDE 0.02 % IN SOLN
0.5000 mg | Freq: Three times a day (TID) | RESPIRATORY_TRACT | Status: DC
  Administered 2019-12-29: 0.5 mg via RESPIRATORY_TRACT
  Filled 2019-12-29: qty 2.5

## 2019-12-29 MED ORDER — FERROUS SULFATE 325 (65 FE) MG PO TBEC
325.0000 mg | DELAYED_RELEASE_TABLET | Freq: Every day | ORAL | 0 refills | Status: AC
Start: 2019-12-29 — End: 2020-12-28

## 2019-12-29 NOTE — Care Management Important Message (Signed)
Important Message  Patient Details  Name: Dominic [name] MRN: [id number] Date of Birth: 12/28/1945   Medicare Important Message Given:  Yes  Initial Medicare IM given by Patient Access Associate on 12/29/2019 at 10:48am.   Johnell Comings 12/29/2019, 1:12 PM

## 2019-12-29 NOTE — Discharge Summary (Signed)
[facility] [place] [facility]   PATIENT NAME: Dominic [name]    MR#:  [id number]  DATE OF BIRTH:  [birth year]  DATE OF ADMISSION:  12/26/2019 ADMITTING PHYSICIAN: Lorretta Harp, MD  DATE OF DISCHARGE: 12/29/2019 12:32 PM  PRIMARY CARE PHYSICIAN: Shane Crutch   ADMISSION DIAGNOSIS:  Weakness [R53.1] Hypoglycemia [E16.2] Hypothermia, initial encounter [T68.XXXA]  DISCHARGE DIAGNOSIS:  Hypoglycemia with history of diabetes Acute kidney injury on chronic disease stage II Elevated liver function test  SECONDARY DIAGNOSIS:   Past Medical History:  Diagnosis Date  . A-fib (HCC)   . Chronic respiratory failure (HCC)    s/p trach, on room air  . Diabetes mellitus without complication (HCC)    Not on medications  . Glaucoma   . Hypertension   . Legal blindness of left eye, as defined in [place]   . Tobacco use     HOSPITAL COURSE:   1.  Hypoglycemia with history of diabetes.  The patient's hemoglobin A1c was very low at 4.0.  The patient initially started on D10 drip.  His sugars have improved.  I did put on sliding scale but his sugars have been okay.  I discontinued insulin, sulfonylurea and Tradjenta.  Just watch sugars at this point. 2.  One set of blood cultures did grow out Staphylococcus species and on the day of discharge they called back with now Alcaligenes faecalis.  I did discuss the case with the infectious disease doctor and they believe that this is still a skin contamination and no further treatment recommended.  A repeat blood culture was recommended by the infectious disease doctor so that was ordered and results are still pending.  The patient does not have to be in the hospital for that. 3.  Acute kidney injury on chronic kidney disease stage II.  We held lisinopril and hydrochlorothiazide and gave a liter bolus.  Creatinine has improved down to 1.04.  Creatinine was as high as 2.03.  Blood pressure stable upon disposition. 4.  Hypokalemia this was  replaced orally 5.  Elevated liver function test and abnormal imaging of the liver back in 2017 on CT scan.  I did order a ultrasound which also did show an abnormality of the liver.  I then ordered an MRI without contrast.  The MRI was limited secondary to patient motion.  The patient had a lesion of the left hepatic lobe that is poorly defined but appears to be a scarlike lesion at the site of prior hepatic abscess.  Can consider repeat imaging of the liver with CT scan with contrast once kidney function is continue to be stable.   6.  Cystic lesion at the head of the pancreas which warrants follow-up with a CAT scan in about 2 years. 7.  Paroxysmal atrial fibrillation.  Not on any anticoagulation 8.  History of tracheostomy and chronic bronchitis did prescribe nebulizer and DuoNeb 9.  Initially admitted with hypothermia but all temperatures have improved 10.  Weakness.  Patient deferred physical therapy evaluation 11.  Essential hypertension.  Blood pressure stable off meds at this point.  Recommend rechecking as outpatient.  DISCHARGE CONDITIONS:   Satisfactory  CONSULTS OBTAINED:  None but case discussed with infectious disease Dr. Joylene Draft  DRUG ALLERGIES:   Allergies  Allergen Reactions  . Gabapentin   . Other Rash    Oatmeal grits Oatmeal grits  . Penicillins Hives, Rash and Other (See Comments)    Has patient had a PCN reaction causing immediate rash, facial/tongue/throat  swelling, SOB or lightheadedness with hypotension: possibly. Pt isn't sure Has patient had a PCN reaction causing severe rash involving mucus membranes or skin necrosis: No Has patient had a PCN reaction that required hospitalization No Has patient had a PCN reaction occurring within the last 10 years: No If all of the above answers are "NO", then may proceed with Cephalosporin use.     DISCHARGE MEDICATIONS:   Allergies as of 12/29/2019      Reactions   Gabapentin    Other Rash    Oatmeal grits Oatmeal grits   Penicillins Hives, Rash, Other (See Comments)   Has patient had a PCN reaction causing immediate rash, facial/tongue/throat swelling, SOB or lightheadedness with hypotension: possibly. Pt isn't sure Has patient had a PCN reaction causing severe rash involving mucus membranes or skin necrosis: No Has patient had a PCN reaction that required hospitalization No Has patient had a PCN reaction occurring within the last 10 years: No If all of the above answers are "NO", then may proceed with Cephalosporin use.      Medication List    STOP taking these medications   acetaminophen 325 MG tablet Commonly known as: TYLENOL   glipiZIDE 5 MG 24 hr tablet Commonly known as: GLUCOTROL XL   lisinopril-hydrochlorothiazide 20-25 MG tablet Commonly known as: ZESTORETIC   Tradjenta 5 MG Tabs tablet Generic drug: linagliptin   Trulicity 0.75 MG/0.5ML Sopn Generic drug: Dulaglutide     TAKE these medications   albuterol 108 (90 Base) MCG/ACT inhaler Commonly known as: VENTOLIN HFA Inhale into the lungs every 6 (six) hours as needed for wheezing or shortness of breath.   aspirin 81 MG chewable tablet Take 81 mg by mouth daily.   Combigan 0.2-0.5 % ophthalmic solution Generic drug: brimonidine-timolol Place 1 drop into both eyes every 12 (twelve) hours.   esomeprazole 40 MG capsule Commonly known as: NEXIUM Take 40 mg by mouth daily at 12 noon.   ferrous sulfate 325 (65 FE) MG EC tablet Take 1 tablet (325 mg total) by mouth daily with breakfast.   ipratropium-albuterol 0.5-2.5 (3) MG/3ML Soln Commonly known as: DUONEB Take 3 mLs by nebulization every 6 (six) hours as needed.   loratadine 10 MG tablet Commonly known as: CLARITIN Take 10 mg by mouth daily.            Durable Medical Equipment  (From admission, onward)         Start     Ordered   12/29/19 0832  For home use only DME Nebulizer machine  Once    Question Answer Comment  Patient  needs a nebulizer to treat with the following condition Tracheostomy present [facility])   Patient needs a nebulizer to treat with the following condition Chronic bronchitis (HCC)   Length of Need Lifetime      12/29/19 0832           DISCHARGE INSTRUCTIONS:   Follow-up PMD 5 days  If you experience worsening of your admission symptoms, develop shortness of breath, life threatening emergency, suicidal or homicidal thoughts you must seek medical attention immediately by calling 911 or calling your MD immediately  if symptoms less severe.  You Must read complete instructions/literature along with all the possible adverse reactions/side effects for all the Medicines you take and that have been prescribed to you. Take any new Medicines after you have completely understood and accept all the possible adverse reactions/side effects.   Please note  You were cared for by a hospitalist  during your hospital stay. If you have any questions about your discharge medications or the care you received while you were in the hospital after you are discharged, you can call the unit and asked to speak with the hospitalist on call if the hospitalist that took care of you is not available. Once you are discharged, your primary care physician will handle any further medical issues. Please note that NO REFILLS for any discharge medications will be authorized once you are discharged, as it is imperative that you return to your primary care physician (or establish a relationship with a primary care physician if you do not have one) for your aftercare needs so that they can reassess your need for medications and monitor your lab values.    Today   CHIEF COMPLAINT:   Chief Complaint  Patient presents with  . Hypoglycemia  . Weakness    HISTORY OF PRESENT ILLNESS:  Kordell Jafri  is a 74 y.o. male came in with hypoglycemia weakness and hypothermia   VITAL SIGNS:  Blood pressure 135/69, pulse 64, temperature 98.2  F (36.8 C), resp. rate 18, height 5\' 11"  (1.803 m), weight 86.9 kg, SpO2 97 %.  I/O:    Intake/Output Summary (Last 24 hours) at 12/29/2019 1707 Last data filed at 12/29/2019 1100 Gross per 24 hour  Intake 340.17 ml  Output 450 ml  Net -109.83 ml    PHYSICAL EXAMINATION:  GENERAL:  74 y.o.-year-old patient lying in the bed with no acute distress.  EYES: Left eye clouded over. HEENT: Head atraumatic, normocephalic. Oropharynx and nasopharynx clear.  LUNGS: After coughing, lungs are clear.  Normal breath sounds bilaterally. No use of accessory muscles of respiration.  CARDIOVASCULAR: S1, S2 normal. No murmurs, rubs, or gallops.  ABDOMEN: Soft, non-tender, non-distended. Bowel sounds present. No organomegaly or mass.  EXTREMITIES: Trace pedal edema.  NEUROLOGIC: Cranial nerves II through XII are intact. Muscle strength 5/5 in all extremities. Sensation intact. Gait not checked.  PSYCHIATRIC: The patient is alert and oriented x 3.  SKIN: No obvious rash, lesion, or ulcer.   DATA REVIEW:   CBC Recent Labs  Lab 12/29/19 0525  WBC 9.1  HGB 7.5*  HCT 22.6*  PLT PLATELET CLUMPS NOTED ON SMEAR, UNABLE TO ESTIMATE    Chemistries  Recent Labs  Lab 12/27/19 0608 12/27/19 0608 12/29/19 0525  NA 134*   < > 135  K 3.1*   < > 3.6  CL 99   < > 103  CO2 25   < > 25  GLUCOSE 201*   < > 95  BUN 13   < > 12  CREATININE 2.03*   < > 1.04  CALCIUM 7.4*   < > 7.4*  MG 1.1*  --   --   AST 214*   < > 131*  ALT 80*   < > 63*  ALKPHOS 413*   < > 472*  BILITOT 2.9*   < > 2.7*   < > = values in this interval not displayed.    Cardiac Enzymes No results for input(s): TROPONINI in the last 168 hours.  Microbiology Results  Results for orders placed or performed during the hospital encounter of 12/26/19  Blood Culture (routine x 2)     Status: Abnormal (Preliminary result)   Collection Time: 12/26/19 11:49 AM   Specimen: BLOOD  Result Value Ref Range Status   Specimen Description    Final    BLOOD RIGHT ANTECUBITAL Performed at [facility], [address] [place], [place] [address]    Special Requests   Final    BOTTLES DRAWN AEROBIC AND ANAEROBIC Blood Culture adequate volume Performed at [facility], [address] [place], [place] [address]    Culture  Setup Time   Final    GRAM POSITIVE COCCI IN BOTH AEROBIC AND ANAEROBIC BOTTLES Organism ID to follow CRITICAL RESULT CALLED TO, READ BACK BY AND VERIFIED WITH: Thomasene Ripple [facility] 0973 12/27/19 HNM Performed at [facility], [address] [place], [place] [address]    Culture (A)  Final    STAPHYLOCOCCUS SPECIES (COAGULASE NEGATIVE) THE SIGNIFICANCE OF ISOLATING THIS ORGANISM FROM A SINGLE SET OF BLOOD CULTURES WHEN MULTIPLE SETS ARE DRAWN IS UNCERTAIN. PLEASE NOTIFY THE MICROBIOLOGY DEPARTMENT WITHIN ONE WEEK IF SPECIATION AND SENSITIVITIES ARE REQUIRED. ALCALIGENES FAECALIS CULTURE REINCUBATED FOR BETTER GROWTH CRITICAL RESULT CALLED TO, READ BACK BY AND VERIFIED WITH: Lorne Skeens HAYES [id number] FCP Performed at [facility], [address], [place], [place] [address]    Report Status PENDING  Incomplete  Blood Culture ID Panel (Reflexed)     Status: Abnormal   Collection Time: 12/26/19 11:49 AM  Result Value Ref Range Status   Enterococcus species NOT DETECTED NOT DETECTED Final   Listeria monocytogenes NOT DETECTED NOT DETECTED Final   Staphylococcus species DETECTED (A) NOT DETECTED Final    Comment: Methicillin (oxacillin) susceptible coagulase negative staphylococcus. Possible blood culture contaminant (unless isolated from more than one blood culture draw or clinical case suggests pathogenicity). No antibiotic treatment is indicated for blood  culture contaminants. CRITICAL RESULT CALLED TO, READ BACK BY AND VERIFIED WITH: Thomasene Ripple PHARMD [id number] 12/27/19 HNM    Staphylococcus aureus (BCID) NOT DETECTED NOT DETECTED Final   Methicillin resistance NOT  DETECTED NOT DETECTED Final   Streptococcus species NOT DETECTED NOT DETECTED Final   Streptococcus agalactiae NOT DETECTED NOT DETECTED Final   Streptococcus pneumoniae NOT DETECTED NOT DETECTED Final   Streptococcus pyogenes NOT DETECTED NOT DETECTED Final   Acinetobacter baumannii NOT DETECTED NOT DETECTED Final   Enterobacteriaceae species NOT DETECTED NOT DETECTED Final   Enterobacter cloacae complex NOT DETECTED NOT DETECTED Final   Escherichia coli NOT DETECTED NOT DETECTED Final   Klebsiella oxytoca NOT DETECTED NOT DETECTED Final   Klebsiella pneumoniae NOT DETECTED NOT DETECTED Final   Proteus species NOT DETECTED NOT DETECTED Final   Serratia marcescens NOT DETECTED NOT DETECTED Final   Haemophilus influenzae NOT DETECTED NOT DETECTED Final   Neisseria meningitidis NOT DETECTED NOT DETECTED Final   Pseudomonas aeruginosa NOT DETECTED NOT DETECTED Final   Candida albicans NOT DETECTED NOT DETECTED Final   Candida glabrata NOT DETECTED NOT DETECTED Final   Candida krusei NOT DETECTED NOT DETECTED Final   Candida parapsilosis NOT DETECTED NOT DETECTED Final   Candida tropicalis NOT DETECTED NOT DETECTED Final    Comment: Performed at [facility], [address] [place], [place] [address]  Blood Culture (routine x 2)     Status: None (Preliminary result)   Collection Time: 12/26/19 11:50 AM   Specimen: BLOOD  Result Value Ref Range Status   Specimen Description BLOOD  Final   Special Requests BLOOD  Final   Culture   Final    NO GROWTH 3 DAYS Performed at [facility], [address] [place], [place] [address]    Report Status PENDING  Incomplete  Respiratory Panel by RT PCR (Flu A&B, Covid) - Nasopharyngeal Swab  Status: None   Collection Time: 12/26/19  1:41 PM   Specimen: Nasopharyngeal Swab  Result Value Ref Range Status   SARS Coronavirus 2 by RT PCR NEGATIVE NEGATIVE Final    Comment: (NOTE) SARS-CoV-2 target nucleic acids are NOT  DETECTED. The SARS-CoV-2 RNA is generally detectable in upper respiratoy specimens during the acute phase of infection. The lowest concentration of SARS-CoV-2 viral copies this assay can detect is 131 copies/mL. A negative result does not preclude SARS-Cov-2 infection and should not be used as the sole basis for treatment or other patient management decisions. A negative result may occur with  improper specimen collection/handling, submission of specimen other than nasopharyngeal swab, presence of viral mutation(s) within the areas targeted by this assay, and inadequate number of viral copies (<131 copies/mL). A negative result must be combined with clinical observations, patient history, and epidemiological information. The expected result is Negative. Fact Sheet for Patients:  https://www.moore.com/ Fact Sheet for Healthcare Providers:  https://www.young.biz/ This test is not yet ap proved or cleared by the [place] FDA and  has been authorized for detection and/or diagnosis of SARS-CoV-2 by FDA under an Emergency Use Authorization (EUA). This EUA will remain  in effect (meaning this test can be used) for the duration of the COVID-19 declaration under Section 564(b)(1) of the Act, 21 U.S.C. section 360bbb-3(b)(1), unless the authorization is terminated or revoked sooner.    Influenza A by PCR NEGATIVE NEGATIVE Final   Influenza B by PCR NEGATIVE NEGATIVE Final    Comment: (NOTE) The Xpert Xpress SARS-CoV-2/FLU/RSV assay is intended as an aid in  the diagnosis of influenza from Nasopharyngeal swab specimens and  should not be used as a sole basis for treatment. Nasal washings and  aspirates are unacceptable for Xpert Xpress SARS-CoV-2/FLU/RSV  testing. Fact Sheet for Patients: https://www.moore.com/ Fact Sheet for Healthcare Providers: https://www.young.biz/ This test is not yet approved or cleared  by the [place] FDA and  has been authorized for detection and/or diagnosis of SARS-CoV-2 by  FDA under an Emergency Use Authorization (EUA). This EUA will remain  in effect (meaning this test can be used) for the duration of the  Covid-19 declaration under Section 564(b)(1) of the Act, 21  U.S.C. section 360bbb-3(b)(1), unless the authorization is  terminated or revoked. Performed at [facility], [address] [place], [place] [address]     RADIOLOGY:  MR ABDOMEN MRCP WO CONTRAST  Result Date: 12/28/2019 CLINICAL DATA:  Liver lesion identified on CT exam. MRI recommended for further evaluation. EXAM: MRI ABDOMEN WITHOUT CONTRAST  (INCLUDING MRCP) TECHNIQUE: Multiplanar multisequence MR imaging of the abdomen was performed. Heavily T2-weighted images of the biliary and pancreatic ducts were obtained, and three-dimensional MRCP images were rendered by post processing. COMPARISON:  Ultrasound 12/28/2019, CT 05/16/2016 FINDINGS: The patient has a tracheostomy. Difficult for patient to hold breath. Difficult for patient to lie flat. This combination of findings result in significant respiratory in body motion which degrades the images. Lower chest:  Lung bases are clear. Hepatobiliary: Within the LEFT hepatic lobe there is a region of low signal intensity on T2 weighted imaging measuring approximately 1 cm (image 9/series 3). Lesion difficult to define on the other sequences. MR sequences are disrupted by patient motion. On the axial T2 weighted imaging there is a scar-like hypodensity measuring 2.0 by 2.0 cm (image 10/6). This at site of a large 7 cm lesion on comparison CT scan from 05/16/2016. No IV contrast was administered. No biliary duct dilatation.  Common bile duct normal. Pancreas: LEFT cystic lesion in the head of the pancreas measures 12 mm (image 21/6. There is mild ductal ectasia of the pancreatic duct beaded dilatation (image 18/series 6. No mass lesion identified Spleen:  Normal spleen. Adrenals/urinary tract: Adrenal glands and kidneys are normal. Stomach/Bowel: Stomach and limited of the small bowel is unremarkable Vascular/Lymphatic: Abdominal aortic normal caliber. No retroperitoneal periportal lymphadenopathy. Musculoskeletal: No aggressive osseous lesion IMPRESSION: 1. Exam is severely degraded by patient motion. Patient has tracheostomy and difficulty lying flat. This makes MRI imaging very difficult in this patient. Consider CT cross-sectional imaging future 2. Lesion in the LEFT hepatic lobe is poorly defined but appears to be a scar-like lesion at site of prior large hepatic abscess or treated neoplasm. Recommend clinical correlation with history. No IV contrast. 3. Cystic lesion in the head of the pancreas warrants follow-up. Recommend follow-up CT with contrast in 2 years per consensus criteria. This recommendation follows ACR consensus guidelines: Management of Incidental Pancreatic Cysts: A White Paper of the ACR Incidental Findings Committee. J Am Coll Radiol 2017;14:911-923. These results will be called to the ordering clinician or representative by the Radiologist Assistant, and communication documented in the PACS or [employer] Dashboard. Electronically Signed   By: Genevive BiStewart  Edmunds M.D.   On: 12/28/2019 15:02   US Abdomen Limited RUQ  Result Date: 12/28/2019 CLINICAL DATA:  Elevated liver enzymes EXAM: ULTRASOUND ABDOMEN LIMITED RIGHT UPPER QUADRANT COMPARISON:  May 16, 2016 FINDINGS: Gallbladder: Surgically absent. Common bile duct: Diameter: 5 mm. No intrahepatic or extrahepatic biliary duct dilatation. Liver: A solid mass is noted in the left lobe of the liver at the site of mass seen on previous CT examination. This mass by ultrasound measures 3.2 x 2.8 x 2.7 cm, somewhat smaller than appreciable on the 2017 CT examination. No other focal liver lesion is evident. There is diffuse increase in liver echogenicity. Portal vein is patent on color Doppler  imaging with normal direction of blood flow towards the liver. Other: None. IMPRESSION: 1. Solid mass in the left lobe of the liver is smaller in appearance by ultrasound compared to the previous CT examination. Note that the increase in liver echogenicity may limit visualization of portions of this mass. Given the size of this lesion an appearance on prior CT examination, correlation with pre and serial post-contrast CT or MR of the liver advised to further evaluate. A neoplastic focus in the liver could present in this manner and must remain a differential consideration. 2. Diffuse increase in liver echogenicity, a finding indicative of underlying hepatic steatosis. As suggested above, the sensitivity of ultrasound for detection of focal liver lesions is somewhat diminished given this circumstance. 3.  Gallbladder absent. These results will be called to the ordering clinician or representative by the Radiologist Assistant, and communication documented in the PACS or [employer] Dashboard. Electronically Signed   By: Bretta BangWilliam  Woodruff III M.D.   On: 12/28/2019 10:27    EKG:   Orders placed or performed during the hospital encounter of 12/26/19  . EKG 12-Lead  . EKG 12-Lead  . EKG 12-Lead  . EKG 12-Lead      Management plans discussed with the patient, family and they are in agreement.  CODE STATUS:     Code Status Orders  (From admission, onward)         Start     Ordered   12/26/19 1603  Full code  Continuous     12/26/19 1602  Code Status History    Date Active Date Inactive Code Status Order ID Comments User Context   05/01/2016 1756 05/04/2016 0557 Full Code [id number]  Gladstone Lighter, MD Inpatient   Advance Care Planning Activity      TOTAL TIME TAKING CARE OF THIS PATIENT: 35 minutes.    Loletha Grayer M.D on 12/29/2019 at 5:07 PM  Between 7am to 6pm - Pager - [phone]  After 6pm go to [email or web address] - password [email or web address]  [facility]  CC: Primary care  physician; Ranae Plumber

## 2019-12-29 NOTE — Consult Note (Signed)
PHARMACY - PHYSICIAN COMMUNICATION CRITICAL VALUE ALERT - BLOOD CULTURE IDENTIFICATION (BCID)  Dominic [name] is an 74 y.o. male who presented to [facility] on 12/26/2019 with a chief complaint of generalized weakness.   Assessment:  1 out 4 bottles growing ALCALIGENES FAECALIS   (include suspected source if known)  Name of physician (or Provider) Contacted: Dr. Renae Gloss and Dr. Rivka Safer   Current antibiotics: None  Changes to prescribed antibiotics recommended: Meropenem Recommendation declined by Dr. Rivka Safer.  Results for orders placed or performed during the hospital encounter of 12/26/19  Blood Culture ID Panel (Reflexed) (Collected: 12/26/2019 11:49 AM)  Result Value Ref Range   Enterococcus species NOT DETECTED NOT DETECTED   Listeria monocytogenes NOT DETECTED NOT DETECTED   Staphylococcus species DETECTED (A) NOT DETECTED   Staphylococcus aureus (BCID) NOT DETECTED NOT DETECTED   Methicillin resistance NOT DETECTED NOT DETECTED   Streptococcus species NOT DETECTED NOT DETECTED   Streptococcus agalactiae NOT DETECTED NOT DETECTED   Streptococcus pneumoniae NOT DETECTED NOT DETECTED   Streptococcus pyogenes NOT DETECTED NOT DETECTED   Acinetobacter baumannii NOT DETECTED NOT DETECTED   Enterobacteriaceae species NOT DETECTED NOT DETECTED   Enterobacter cloacae complex NOT DETECTED NOT DETECTED   Escherichia coli NOT DETECTED NOT DETECTED   Klebsiella oxytoca NOT DETECTED NOT DETECTED   Klebsiella pneumoniae NOT DETECTED NOT DETECTED   Proteus species NOT DETECTED NOT DETECTED   Serratia marcescens NOT DETECTED NOT DETECTED   Haemophilus influenzae NOT DETECTED NOT DETECTED   Neisseria meningitidis NOT DETECTED NOT DETECTED   Pseudomonas aeruginosa NOT DETECTED NOT DETECTED   Candida albicans NOT DETECTED NOT DETECTED   Candida glabrata NOT DETECTED NOT DETECTED   Candida krusei NOT DETECTED NOT DETECTED   Candida parapsilosis NOT DETECTED NOT DETECTED   Candida  tropicalis NOT DETECTED NOT DETECTED    Ronnald Ramp, PharmD, BCPS 12/29/2019  12:07 PM

## 2019-12-29 NOTE — Discharge Instructions (Signed)
Hypoglycemia Hypoglycemia is when the sugar (glucose) level in your blood is too low. Signs of low blood sugar may include:  Feeling: ? Hungry. ? Worried or nervous (anxious). ? Sweaty and clammy. ? Confused. ? Dizzy. ? Sleepy. ? Sick to your stomach (nauseous).  Having: ? A fast heartbeat. ? A headache. ? A change in your vision. ? Tingling or no feeling (numbness) around your mouth, lips, or tongue. ? Jerky movements that you cannot control (seizure).  Having trouble with: ? Moving (coordination). ? Sleeping. ? Passing out (fainting). ? Getting upset easily (irritability). Low blood sugar can happen to people who have diabetes and people who do not have diabetes. Low blood sugar can happen quickly, and it can be an emergency. Treating low blood sugar Low blood sugar is often treated by eating or drinking something sugary right away, such as:  Fruit juice, 4-6 oz (120-150 mL).  Regular soda (not diet soda), 4-6 oz (120-150 mL).  Low-fat milk, 4 oz (120 mL).  Several pieces of hard candy.  Sugar or honey, 1 Tbsp (15 mL). Treating low blood sugar if you have diabetes If you can think clearly and swallow safely, follow the 15:15 rule:  Take 15 grams of a fast-acting carb (carbohydrate). Talk with your doctor about how much you should take.  Always keep a source of fast-acting carb with you, such as: ? Sugar tablets (glucose pills). Take 3-4 pills. ? 6-8 pieces of hard candy. ? 4-6 oz (120-150 mL) of fruit juice. ? 4-6 oz (120-150 mL) of regular (not diet) soda. ? 1 Tbsp (15 mL) honey or sugar.  Check your blood sugar 15 minutes after you take the carb.  If your blood sugar is still at or below 70 mg/dL (3.9 mmol/L), take 15 grams of a carb again.  If your blood sugar does not go above 70 mg/dL (3.9 mmol/L) after 3 tries, get help right away.  After your blood sugar goes back to normal, eat a meal or a snack within 1 hour.  Treating very low blood sugar If your  blood sugar is at or below 54 mg/dL (3 mmol/L), you have very low blood sugar (severe hypoglycemia). This may also cause:  Passing out.  Jerky movements you cannot control (seizure).  Losing consciousness (coma). This is an emergency. Do not wait to see if the symptoms will go away. Get medical help right away. Call your local emergency services (911 in the [place] Do not drive yourself to the hospital. If you have very low blood sugar and you cannot eat or drink, you may need a glucagon shot (injection). A family member or friend should learn how to check your blood sugar and how to give you a glucagon shot. Ask your doctor if you need to have a glucagon shot kit at home. Follow these instructions at home: General instructions  Take over-the-counter and prescription medicines only as told by your doctor.  Stay aware of your blood sugar as told by your doctor.  Limit alcohol intake to no more than 1 drink a day for nonpregnant women and 2 drinks a day for men. One drink equals 12 oz of beer (355 mL), 5 oz of wine (148 mL), or 1 oz of hard liquor (44 mL).  Keep all follow-up visits as told by your doctor. This is important. If you have diabetes:   Follow your diabetes care plan as told by your doctor. Make sure you: ? Know the signs of low blood sugar. ?  Take your medicines as told. ? Follow your exercise and meal plan. ? Eat on time. Do not skip meals. ? Check your blood sugar as often as told by your doctor. Always check it before and after exercise. ? Follow your sick day plan when you cannot eat or drink normally. Make this plan ahead of time with your doctor.  Share your diabetes care plan with: ? Your work or school. ? People you live with.  Check your pee (urine) for ketones: ? When you are sick. ? As told by your doctor.  Carry a card or wear jewelry that says you have diabetes. Contact a doctor if:  You have trouble keeping your blood sugar in your target  range.  You have low blood sugar often. Get help right away if:  You still have symptoms after you eat or drink something sugary.  Your blood sugar is at or below 54 mg/dL (3 mmol/L).  You have jerky movements that you cannot control.  You pass out. These symptoms may be an emergency. Do not wait to see if the symptoms will go away. Get medical help right away. Call your local emergency services (911 in the [place] Do not drive yourself to the hospital. Summary  Hypoglycemia happens when the level of sugar (glucose) in your blood is too low.  Low blood sugar can happen to people who have diabetes and people who do not have diabetes. Low blood sugar can happen quickly, and it can be an emergency.  Make sure you know the signs of low blood sugar and know how to treat it.  Always keep a source of sugar (fast-acting carb) with you to treat low blood sugar. This information is not intended to replace advice given to you by your health care provider. Make sure you discuss any questions you have with your health care provider. Document Revised: 12/09/2018 Document Reviewed: 09/21/2015 Elsevier Patient Education  2020 [employer]  

## 2019-12-29 NOTE — Progress Notes (Signed)
Patient wallet retrieved from the safe and handed to patient prior to his discharge. Transportation set up to take patient home as per family request .

## 2019-12-29 NOTE — Clinical Social Work Note (Signed)
Referral for [employer] provided to [employer] with [employer]. He will provide at bedside.  [place], [place] [phone]

## 2019-12-31 LAB — CULTURE, BLOOD (ROUTINE X 2)
Culture: NO GROWTH
Special Requests: ADEQUATE

## 2020-01-31 DEATH — deceased

## 2020-08-25 IMAGING — MR MR MRCP
9 of 11 series · 39 of 48 positions shown · non-contrast
Comparison: Ultrasound 12/28/2019, CT 05/16/2016

CLINICAL DATA: Liver lesion identified on CT exam. MRI recommended
for further evaluation.

EXAM:
MRI ABDOMEN WITHOUT CONTRAST  (INCLUDING MRCP)
TECHNIQUE: Multiplanar multisequence MR imaging of the abdomen was performed.
Heavily T2-weighted images of the biliary and pancreatic ducts were
obtained, and three-dimensional MRCP images were rendered by post
processing.

[Series 3: T2 · coronal · 6.0mm · 1.19mm/px · 2 of 30 slices shown (1 of 2)]
[im 1/30]
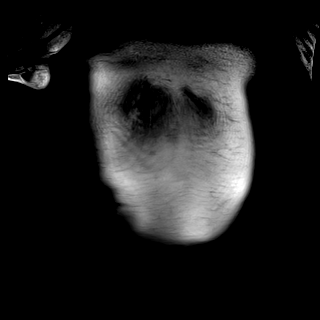
[im 30/30]
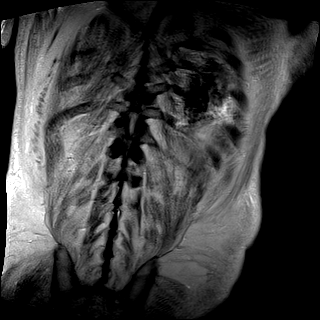

[Series 4: ax dwi_tracew · axial · 6.0mm · 1.42mm/px · z∈[-101,+108]mm · 10 of 90 slices shown]
[im 1/90]
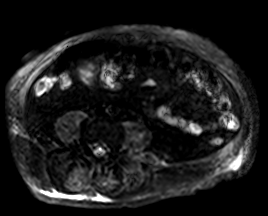
[im 10/90]
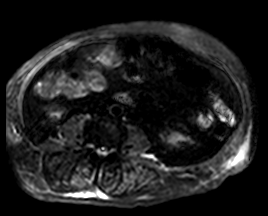
[im 20/90]
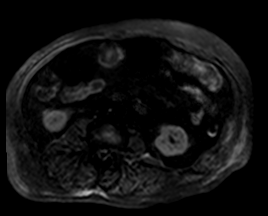
[im 30/90]
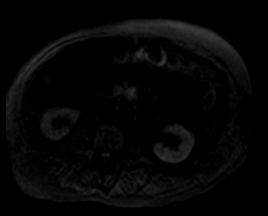
[im 40/90]
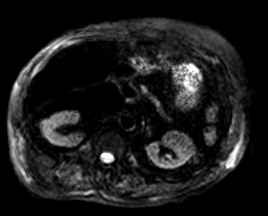
[im 50/90]
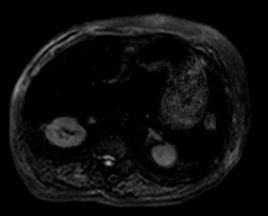
[im 60/90]
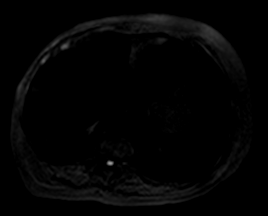
[im 70/90]
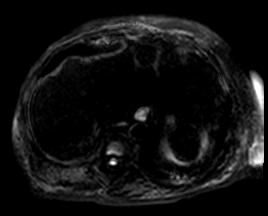
[im 80/90]
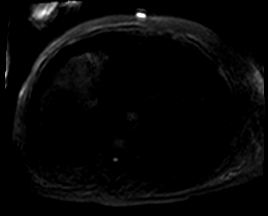
[im 90/90]
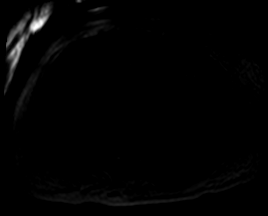

[Series 5: ax dwi_adc · axial · 6.0mm · 1.42mm/px · z∈[-101,+108]mm · 3 of 30 slices shown]
[im 1/30]
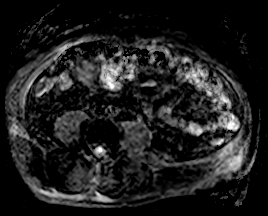
[im 15/30]
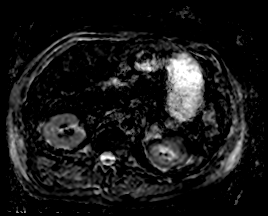
[im 30/30]
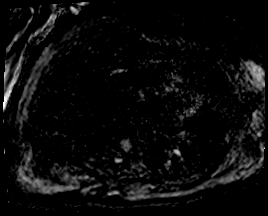

[Series 6: T2 · axial · 6.0mm · 1.19mm/px · z∈[-105,+118]mm · 3 of 32 slices shown (2 of 2)]
[im 1/32]
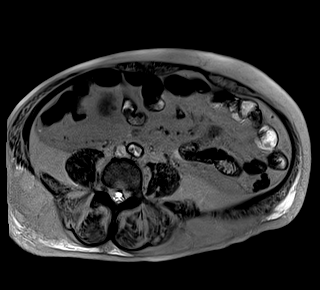
[im 16/32]
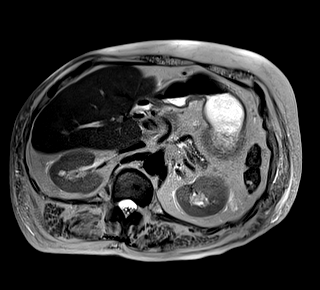
[im 32/32]
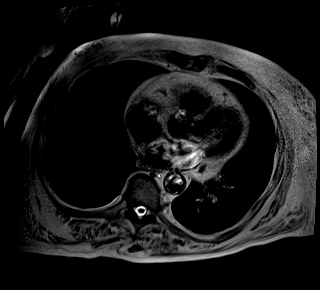

[Series 9: T2 fat-sat · axial · 6.0mm · 1.19mm/px · z∈[-98,+110]mm · 3 of 30 slices shown]
[im 1/30]
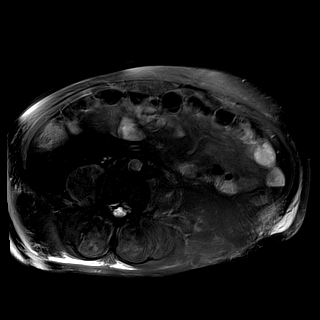
[im 15/30]
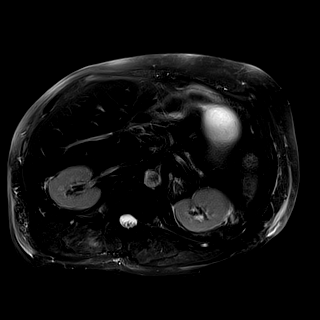
[im 30/30]
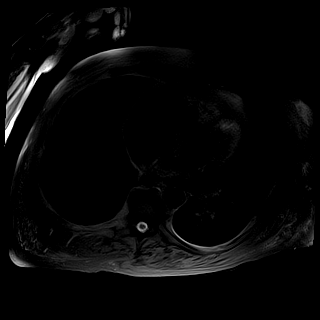

[Series 10: T1 · axial · 6.0mm · 0.74mm/px · z∈[-105,+118]mm · 7 of 64 slices shown]
[im 1/64]
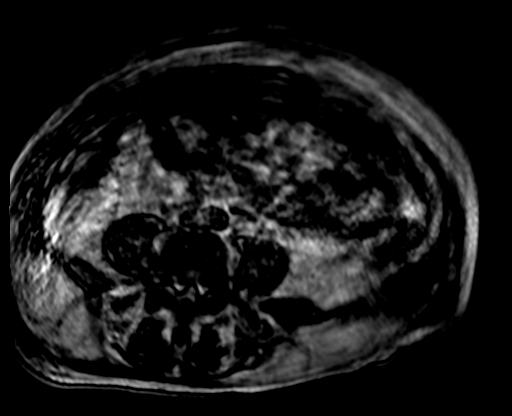
[im 11/64]
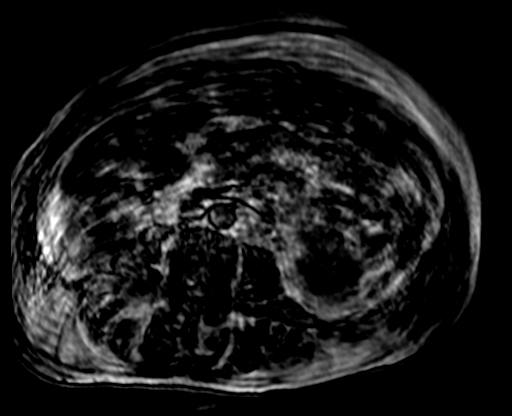
[im 22/64]
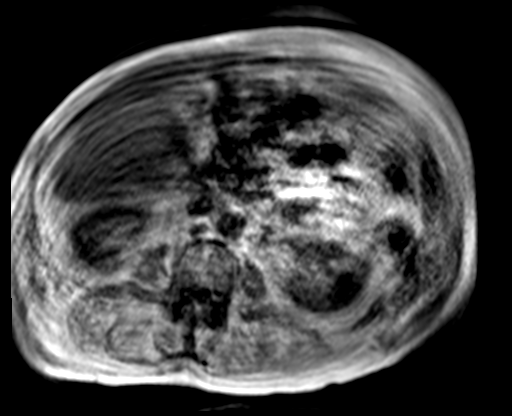
[im 32/64]
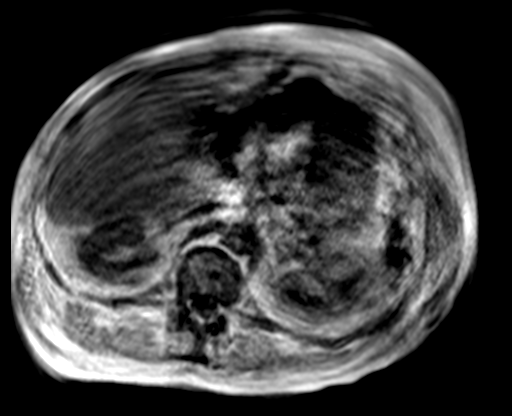
[im 43/64]
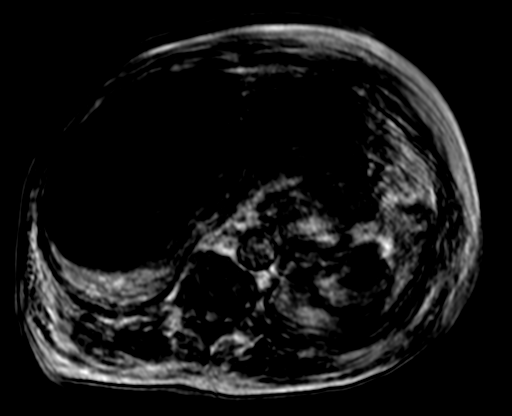
[im 53/64]
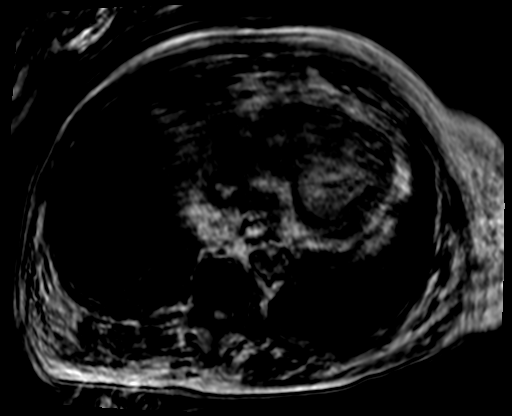
[im 64/64]
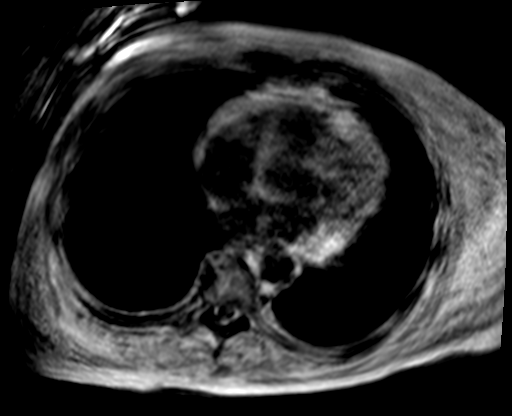

[Series 11: MRCP · coronal · 3.0mm · 1.12mm/px · 2 of 17 slices shown]
[im 1/17]
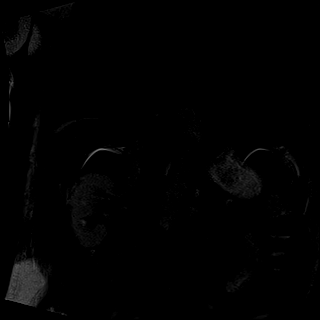
[im 17/17]
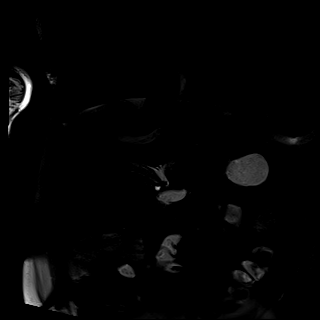

[Series 12: radials · coronal · 50.0mm · 0.78mm/px · 1 of 5 slices shown]
[im 1/5]
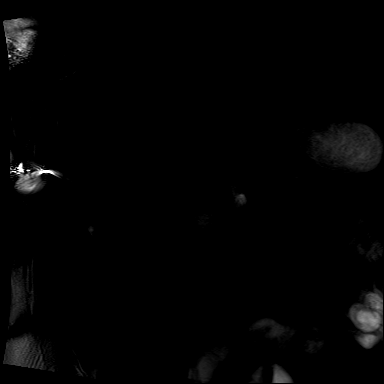

[Series 16: T1 dynamic fat-sat · axial · 3.0mm · 1.19mm/px · z∈[-100,+113]mm · 8 of 72 slices shown]
[im 1/72]
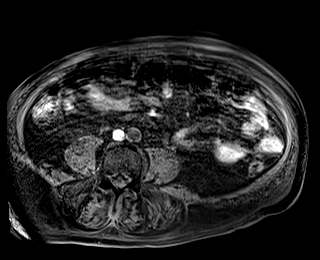
[im 11/72]
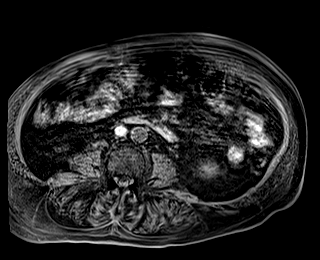
[im 21/72]
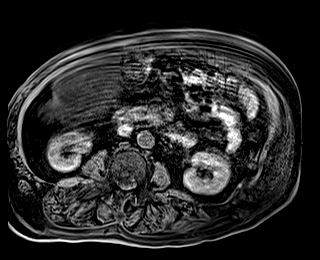
[im 31/72]
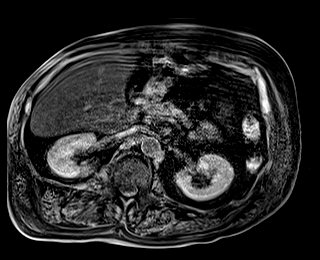
[im 41/72]
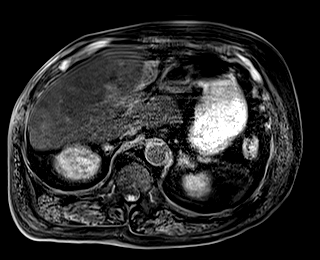
[im 51/72]
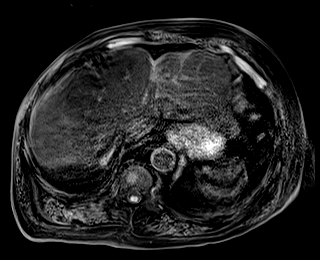
[im 61/72]
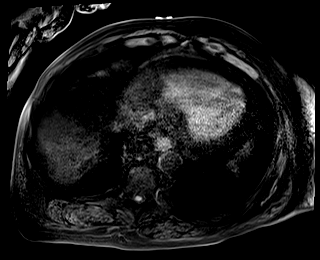
[im 72/72]
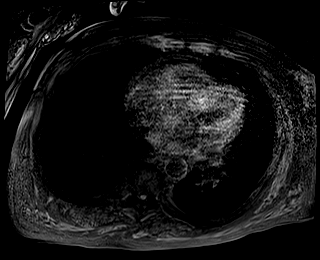

[39 of 48 positions shown; findings below may reference images not displayed]

FINDINGS: The patient has a tracheostomy. Difficult for patient to hold
breath. Difficult for patient to lie flat. This combination of
findings result in significant respiratory in body motion which
degrades the images.

Lower chest:  Lung bases are clear.

Hepatobiliary: Within the LEFT hepatic lobe there is a region of low
signal intensity on T2 weighted imaging measuring approximately 1 cm
(image 9/series 3). Lesion difficult to define on the other
sequences. MR sequences are disrupted by patient motion. On the
axial T2 weighted imaging there is a scar-like hypodensity measuring
2.0 by 2.0 cm (image [DATE]). This at site of a large 7 cm lesion on
comparison CT scan from 05/16/2016. No IV contrast was administered.

No biliary duct dilatation.  Common bile duct normal.

Pancreas: LEFT cystic lesion in the head of the pancreas measures 12
mm (image [DATE]. There is mild ductal ectasia of the pancreatic duct
beaded dilatation (image 18/series 6. No mass lesion identified

Spleen: Normal spleen.

Adrenals/urinary tract: Adrenal glands and kidneys are normal.

Stomach/Bowel: Stomach and limited of the small bowel is
unremarkable

Vascular/Lymphatic: Abdominal aortic normal caliber. No
retroperitoneal periportal lymphadenopathy.

Musculoskeletal: No aggressive osseous lesion
IMPRESSION: 1. Exam is severely degraded by patient motion. Patient has
tracheostomy and difficulty lying flat. This makes MRI imaging very
difficult in this patient. Consider CT cross-sectional imaging
future
2. Lesion in the LEFT hepatic lobe is poorly defined but appears to
be a scar-like lesion at site of prior large hepatic abscess or
treated neoplasm. Recommend clinical correlation with history. No IV
contrast.
3. Cystic lesion in the head of the pancreas warrants follow-up.
Recommend follow-up CT with contrast in 2 years per consensus
criteria. This recommendation follows ACR consensus guidelines:
Management of Incidental Pancreatic Cysts: A White Paper of the ACR
Incidental Findings Committee. [HOSPITAL] 2733;[DATE].

These results will be called to the ordering clinician or
representative by the Radiologist Assistant, and communication
documented in the PACS or [REDACTED].

## 2020-08-25 IMAGING — US US ABDOMEN LIMITED
1 series · 13 of 25 positions shown · non-contrast
Comparison: May 16, 2016

CLINICAL DATA: Elevated liver enzymes

EXAM:
ULTRASOUND ABDOMEN LIMITED RIGHT UPPER QUADRANT

[Series 1: us abdomen limited ruq · 13 of 46 slices shown]
[im 1/46]
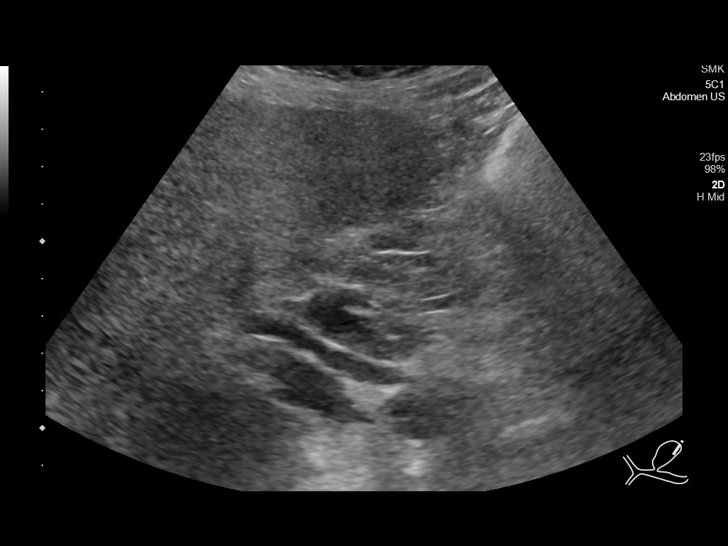
[im 4/46]
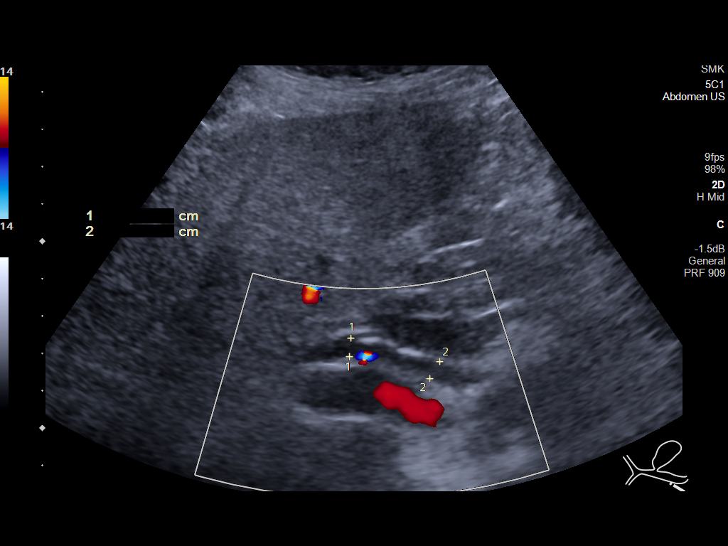
[im 8/46]
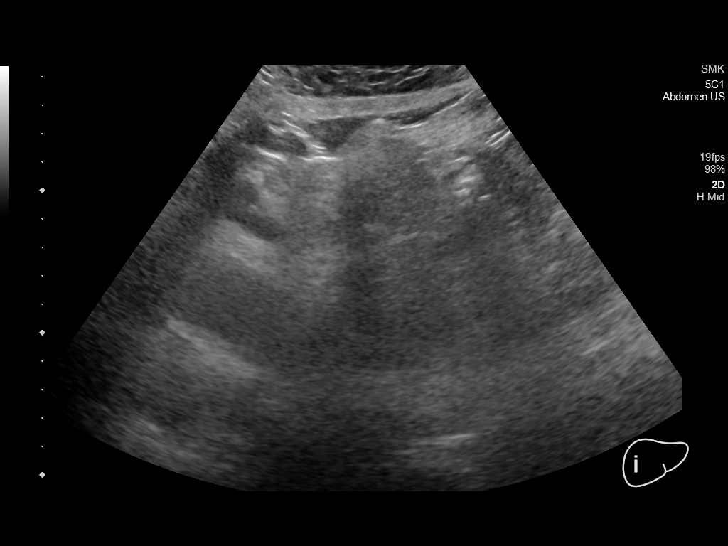
[im 12/46]
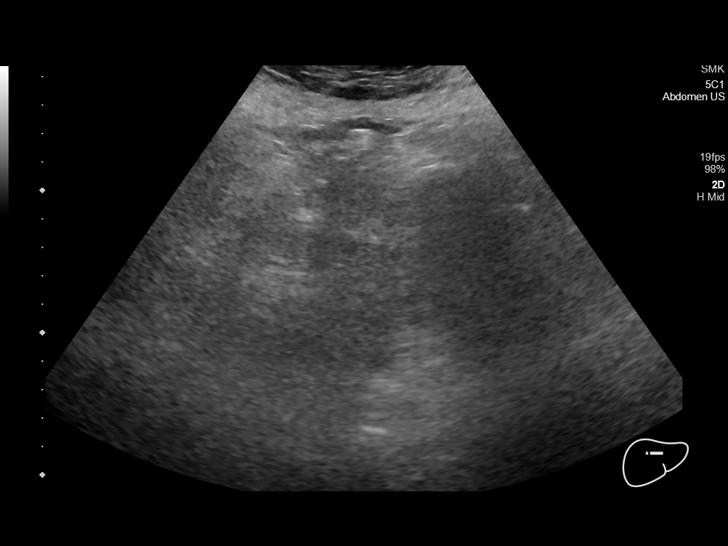
[im 16/46]
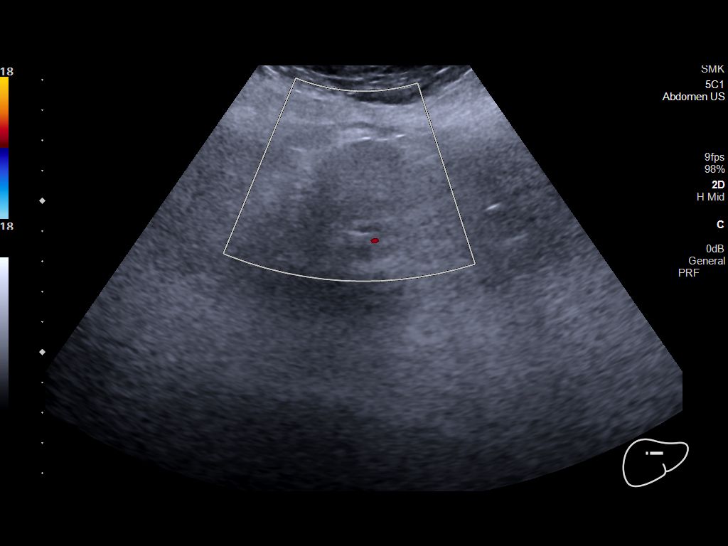
[im 19/46]
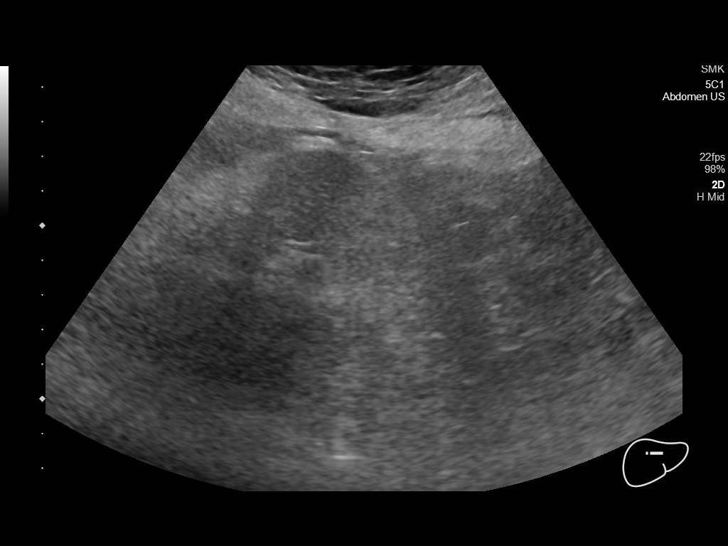
[im 23/46]
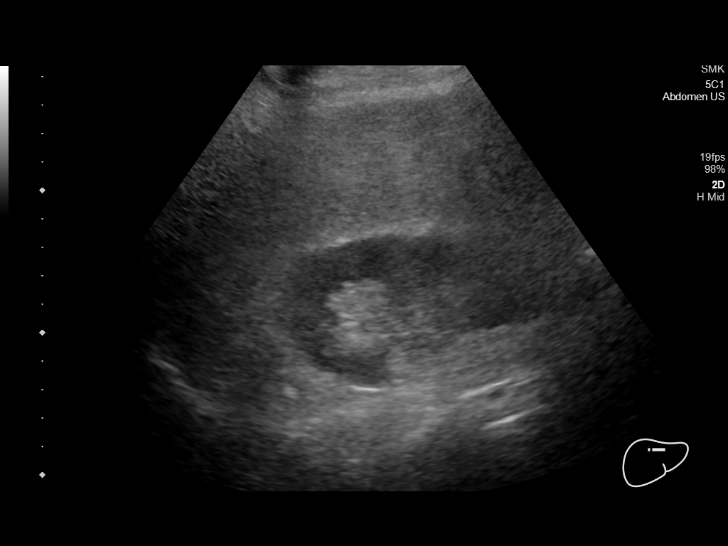
[im 27/46]
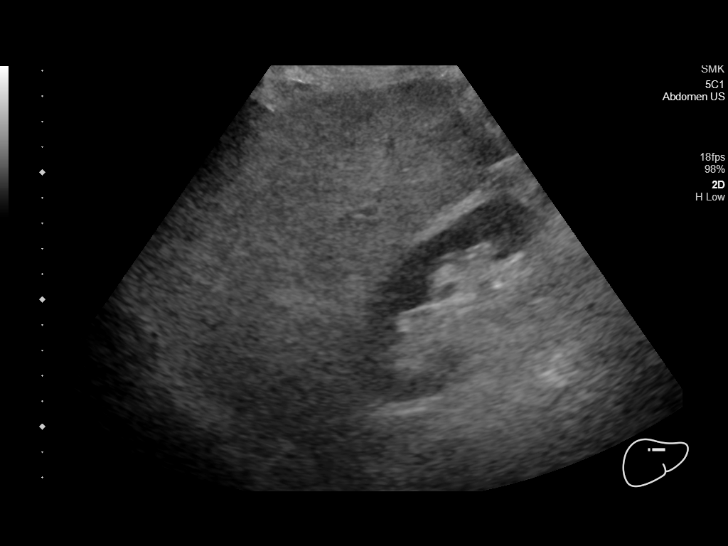
[im 31/46]
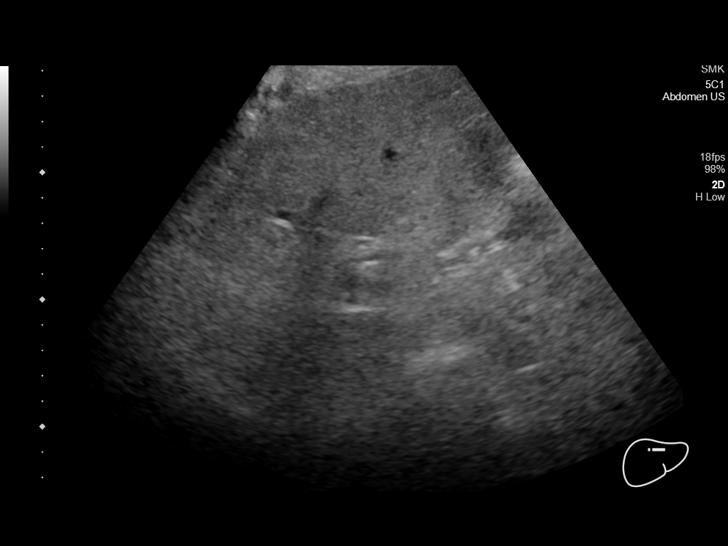
[im 34/46]
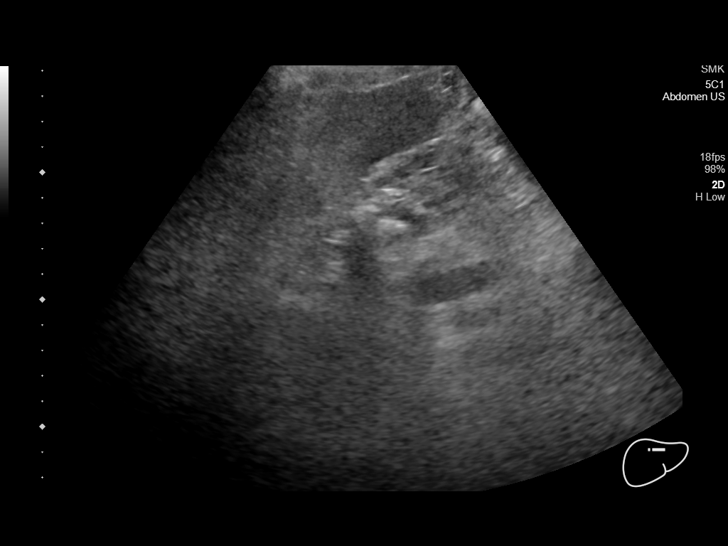
[im 38/46]
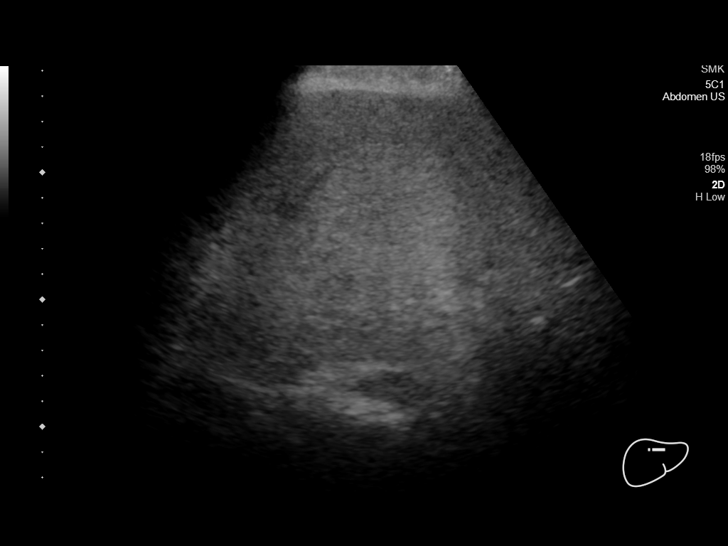
[im 42/46]
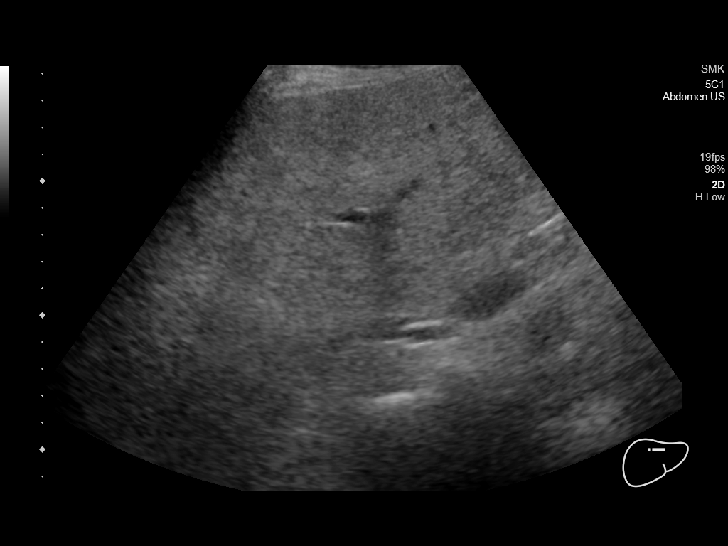
[im 46/46]
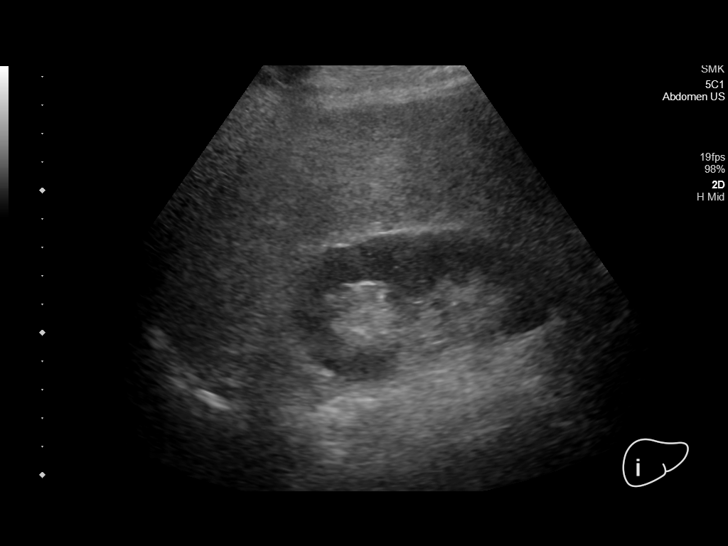

[13 of 25 positions shown; findings below may reference images not displayed]

FINDINGS: Gallbladder:

Surgically absent.

Common bile duct:

Diameter: 5 mm. No intrahepatic or extrahepatic biliary duct
dilatation.

Liver:

A solid mass is noted in the left lobe of the liver at the site of
mass seen on previous CT examination. This mass by ultrasound
measures 3.2 x 2.8 x 2.7 cm, somewhat smaller than appreciable on
the 0895 CT examination. No other focal liver lesion is evident.
There is diffuse increase in liver echogenicity. Portal vein is
patent on color Doppler imaging with normal direction of blood flow
towards the liver.

Other: None.
IMPRESSION: 1. Solid mass in the left lobe of the liver is smaller in appearance
by ultrasound compared to the previous CT examination. Note that the
increase in liver echogenicity may limit visualization of portions
of this mass. Given the size of this lesion an appearance on prior
CT examination, correlation with pre and serial post-contrast CT or
MR of the liver advised to further evaluate. A neoplastic focus in
the liver could present in this manner and must remain a
differential consideration.

2. Diffuse increase in liver echogenicity, a finding indicative of
underlying hepatic steatosis. As suggested above, the sensitivity of
ultrasound for detection of focal liver lesions is somewhat
diminished given this circumstance.

3.  Gallbladder absent.

These results will be called to the ordering clinician or
representative by the Radiologist Assistant, and communication
documented in the PACS or [REDACTED].
